# Patient Record
Sex: Female | Born: 1950 | Race: White | Hispanic: No | Marital: Married | Smoking: Former smoker
Health system: Southern US, Community
[De-identification: ages and names within clinical notes are randomized; demographics above are authoritative.]

## PROBLEM LIST (undated history)

## (undated) DIAGNOSIS — C719 Malignant neoplasm of brain, unspecified: Secondary | ICD-10-CM

## (undated) DIAGNOSIS — E559 Vitamin D deficiency, unspecified: Secondary | ICD-10-CM

## (undated) DIAGNOSIS — I10 Essential (primary) hypertension: Secondary | ICD-10-CM

## (undated) DIAGNOSIS — C3490 Malignant neoplasm of unspecified part of unspecified bronchus or lung: Secondary | ICD-10-CM

## (undated) DIAGNOSIS — R7303 Prediabetes: Secondary | ICD-10-CM

## (undated) DIAGNOSIS — F419 Anxiety disorder, unspecified: Secondary | ICD-10-CM

## (undated) DIAGNOSIS — E785 Hyperlipidemia, unspecified: Secondary | ICD-10-CM

## (undated) HISTORY — DX: Vitamin D deficiency, unspecified: E55.9

## (undated) HISTORY — DX: Malignant neoplasm of brain, unspecified: C71.9

## (undated) HISTORY — DX: Hyperlipidemia, unspecified: E78.5

## (undated) HISTORY — DX: Malignant neoplasm of unspecified part of unspecified bronchus or lung: C34.90

## (undated) HISTORY — DX: Essential (primary) hypertension: I10

## (undated) HISTORY — DX: Anxiety disorder, unspecified: F41.9

## (undated) HISTORY — DX: Prediabetes: R73.03

---

## 1997-06-06 HISTORY — PX: ABDOMINAL HYSTERECTOMY: SHX81

## 1998-06-06 HISTORY — PX: CARPAL TUNNEL RELEASE: SHX101

## 1998-06-29 ENCOUNTER — Other Ambulatory Visit: Admission: RE | Admit: 1998-06-29 | Discharge: 1998-06-29 | Payer: Self-pay | Admitting: Obstetrics and Gynecology

## 2000-01-11 ENCOUNTER — Ambulatory Visit (HOSPITAL_BASED_OUTPATIENT_CLINIC_OR_DEPARTMENT_OTHER): Admission: RE | Admit: 2000-01-11 | Discharge: 2000-01-11 | Payer: Self-pay | Admitting: Orthopedic Surgery

## 2000-02-22 ENCOUNTER — Ambulatory Visit (HOSPITAL_BASED_OUTPATIENT_CLINIC_OR_DEPARTMENT_OTHER): Admission: RE | Admit: 2000-02-22 | Discharge: 2000-02-22 | Payer: Self-pay | Admitting: Orthopedic Surgery

## 2000-06-06 HISTORY — PX: LASIK: SHX215

## 2002-09-30 ENCOUNTER — Other Ambulatory Visit: Admission: RE | Admit: 2002-09-30 | Discharge: 2002-09-30 | Payer: Self-pay | Admitting: Obstetrics and Gynecology

## 2005-06-23 ENCOUNTER — Other Ambulatory Visit: Admission: RE | Admit: 2005-06-23 | Discharge: 2005-06-23 | Payer: Self-pay | Admitting: Internal Medicine

## 2010-02-01 ENCOUNTER — Ambulatory Visit (HOSPITAL_COMMUNITY): Admission: RE | Admit: 2010-02-01 | Discharge: 2010-02-01 | Payer: Self-pay | Admitting: Internal Medicine

## 2010-10-22 NOTE — Op Note (Signed)
[place]. [facility]  Patient:    Christina Hale, Christina Hale                      MRN: [id number] Proc. Date: 01/11/00 Adm. Date:  [id number] Disc. 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  OPERATION PERFORMED:  Release of left dorsal compartment.  SURGEON:  Katy Fitch. Sypher, Montez Hageman., M.D.  ASSISTANT:  Annye Rusk, P.A.-C.  ANESTHESIA:  Marcaine 0.25% and 2% lidocaine field block supplemented by IV sedation.  ANESTHESIOLOGIST:  Judie Petit, M.D.  INDICATIONS:  Christina Hale is a 60 year old woman who has had chronic wrist pain on the radial aspect of her right and left wrists.  She has been treated with splints and anti-inflammatory medication with relief of her symptoms on the right.  She has had a very firm swelling develop on the left first dorsal compartment consistent with a ganglion or giant cell tumor and has had chronic stenosis and tenosynovitis unresponsive to nonoperative measures.  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  DESCRIPTION OF PROCEDURE:  Christina Hale is brought to the operating room and placed in the supine position on the operating table.  Following placement of a 1/4% Marcaine and 2% lidocaine field block including the first dorsal compartment, the left arm was prepped with Duraprep and draped with stockinette and sterile impervious drapes.  The arm was exsanguinated with an Esmarch bandage and a sterile tourniquet inflated to 220 mmHg.  The procedure commenced with a short transverse incision directly over the first dorsal compartment.  Subcutaneous  tissues were carefully divided revealing the radial superficial sensory branches. These were gently retracted.  The first dorsal compartment was noted to be swollen with two giant cell type lesions forming within the substance of the compartment.  The compartment wall had thickened to more than 4 mm in diameter.  This was incised with a scalpel and the two fibers of the compartment identified.  The abductor pollicis longus tendon slips were noted on the palmar aspect of the compartment and a septum was identified and resected. The extensor pollicis brevis was otherwise normal.  Thereafter full range of motion of the thumb was recovered without pain.  The wound was repaired with interdermal 3-0 Prolene suture and Steri-Strips. A compressive dressing applied with sterile gauze and Ace wrap.  There were no apparent complications.  Christina Hale is advised to begin immediate active range of motion exercises. She will return to the office and will follow up in approximately 10 days for suture removal and initiation of a strengthening program. DD:  01/11/00 TD:  01/12/00 Job: [id number]

## 2010-10-22 NOTE — Op Note (Signed)
. [name]  Patient:    Christina Hale, Christina Hale                      MRN: [id number] Proc. Date: 02/22/00 Adm. 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  POSTOPERATIVE DIAGNOSIS:  Chronic stenosing tenosynovitis right first dorsal compartment with severe thickening of extensor retinaculum of first dorsal compartment.  OPERATION:  Release of first dorsal compartment with resection of septum between extensor pollicis and adductor pollicis longus tendons.  SURGEON:  Katy Fitch. Sypher, Montez Hageman., M.D.  ASSISTANT:  Jonni Sanger, P.A.  ANESTHESIA:  Marcaine 0.25% and 2% Lidocaine without epinephrine field block and first dorsal compartment block, right wrist supplemented by the sedation.  SUPERVISING ANESTHESIOLOGIST:  Halford Decamp, M.D.  INDICATIONS:  [name] is a 60 year old woman who has had severe bilateral dorsal compartment stenosing tenosynovitis.  She is status post repair of her left first dorsal compartment predicament and is now scheduled for right release.  After informed consent, she was brought to the operating room at this time. Preoperatively she was noted to have a positive Finkelsteins sign, significant thickening of the compartment and has failed conservative management.  DESCRIPTION OF PROCEDURE:  [name] was brought to the operating room and placed in the supine position on the operating table.  Follow alcohol prep 0.25% Marcaine and 2% Lidocaine were infiltrated in to first dorsal compartment and skin over the radial styloid region.  When anesthesia was satisfactory, the arm was prepped with Betadine soap and solution and sterilely draped.  The arm was exsanguinated with Esmarch bandage and the  arterial tourniquet inflated to 220 mmHg.  Procedure commenced with a short transverse incision directly over the top of the enlarged compartment. Subcutaneous tissues were carefully divided revealing the radial sensory branches.  These were carefully retracted.  The first dorsal compartment was markedly thickened and calcified.  This was split with scalpel and had a wall thickness measuring more than 5 mm.  The extensor pollicis brevis was located in the dorsal compartment. A second incision was fashioned in more palmar position revealing the other pollicis longus tendon slips. The septum between the two compartments was resected with scissors and rongeur dissection.  The tendons were noted to be in basically good repair, although the extensor pollicis brevis appeared to be a bit frayed.  The wound was inspected for bleeding points.  A minor synovectomy was performed on the extensor pollicis brevis followed by repair of the skin with intradermal 3-0 Prolene and Steri-Strips.  Compressive dressing was applied with Xeroflow sterile gauze and ACE wrap. There were no apparent complications.  [name] tolerated the surgery and anesthesia well.  She was transferred to the recovery room in stable condition. DD:  02/22/00 TD:  02/24/00 Job: [id number]

## 2012-06-14 ENCOUNTER — Encounter: Payer: Self-pay | Admitting: Internal Medicine

## 2013-01-25 ENCOUNTER — Other Ambulatory Visit: Payer: Self-pay | Admitting: Internal Medicine

## 2013-01-30 ENCOUNTER — Encounter: Payer: Self-pay | Admitting: Internal Medicine

## 2013-01-31 ENCOUNTER — Ambulatory Visit
Admission: RE | Admit: 2013-01-31 | Discharge: 2013-01-31 | Disposition: A | Discharge status: Home / Self Care | Payer: Self-pay | Source: Ambulatory Visit | Attending: Internal Medicine | Admitting: Internal Medicine

## 2013-05-23 ENCOUNTER — Other Ambulatory Visit: Payer: Self-pay | Admitting: Internal Medicine

## 2013-07-16 ENCOUNTER — Other Ambulatory Visit: Payer: Self-pay | Admitting: Internal Medicine

## 2013-07-25 ENCOUNTER — Other Ambulatory Visit: Payer: Self-pay | Admitting: Internal Medicine

## 2013-08-23 ENCOUNTER — Other Ambulatory Visit: Payer: Self-pay | Admitting: Internal Medicine

## 2013-08-23 DIAGNOSIS — R7303 Prediabetes: Secondary | ICD-10-CM | POA: Insufficient documentation

## 2013-08-23 DIAGNOSIS — E559 Vitamin D deficiency, unspecified: Secondary | ICD-10-CM | POA: Insufficient documentation

## 2013-08-23 DIAGNOSIS — I10 Essential (primary) hypertension: Secondary | ICD-10-CM | POA: Insufficient documentation

## 2013-08-23 DIAGNOSIS — E785 Hyperlipidemia, unspecified: Secondary | ICD-10-CM | POA: Insufficient documentation

## 2013-08-23 DIAGNOSIS — F419 Anxiety disorder, unspecified: Secondary | ICD-10-CM | POA: Insufficient documentation

## 2013-08-26 ENCOUNTER — Ambulatory Visit (INDEPENDENT_AMBULATORY_CARE_PROVIDER_SITE_OTHER): Payer: BC Managed Care – PPO | Admitting: Physician Assistant

## 2013-08-26 ENCOUNTER — Encounter: Payer: Self-pay | Admitting: Physician Assistant

## 2013-08-26 VITALS — BP 136/70 | HR 100 | Temp 98.2°F | Resp 18 | Ht 70.0 in | Wt 186.0 lb

## 2013-08-26 DIAGNOSIS — E559 Vitamin D deficiency, unspecified: Secondary | ICD-10-CM

## 2013-08-26 DIAGNOSIS — F419 Anxiety disorder, unspecified: Secondary | ICD-10-CM

## 2013-08-26 DIAGNOSIS — E785 Hyperlipidemia, unspecified: Secondary | ICD-10-CM

## 2013-08-26 DIAGNOSIS — R7309 Other abnormal glucose: Secondary | ICD-10-CM

## 2013-08-26 DIAGNOSIS — I10 Essential (primary) hypertension: Secondary | ICD-10-CM

## 2013-08-26 DIAGNOSIS — Z79899 Other long term (current) drug therapy: Secondary | ICD-10-CM

## 2013-08-26 DIAGNOSIS — R7303 Prediabetes: Secondary | ICD-10-CM

## 2013-08-26 DIAGNOSIS — F411 Generalized anxiety disorder: Secondary | ICD-10-CM

## 2013-08-26 LAB — CBC WITH DIFFERENTIAL/PLATELET
BASOS ABS: 0.1 10*3/uL (ref 0.0–0.1)
BASOS PCT: 1 % (ref 0–1)
EOS ABS: 0.2 10*3/uL (ref 0.0–0.7)
Eosinophils Relative: 2 % (ref 0–5)
HCT: 44.3 % (ref 36.0–46.0)
HEMOGLOBIN: 15.2 g/dL — AB (ref 12.0–15.0)
LYMPHS ABS: 1.4 10*3/uL (ref 0.7–4.0)
Lymphocytes Relative: 17 % (ref 12–46)
MCH: 30.9 pg (ref 26.0–34.0)
MCHC: 34.3 g/dL (ref 30.0–36.0)
MCV: 90 fL (ref 78.0–100.0)
MONO ABS: 0.5 10*3/uL (ref 0.1–1.0)
MONOS PCT: 6 % (ref 3–12)
NEUTROS ABS: 6.2 10*3/uL (ref 1.7–7.7)
NEUTROS PCT: 74 % (ref 43–77)
PLATELETS: 263 10*3/uL (ref 150–400)
RBC: 4.92 MIL/uL (ref 3.87–5.11)
RDW: 13.3 % (ref 11.5–15.5)
WBC: 8.4 10*3/uL (ref 4.0–10.5)

## 2013-08-26 LAB — HEMOGLOBIN A1C
Hgb A1c MFr Bld: 5.9 % — ABNORMAL HIGH (ref <5.7)
Mean Plasma Glucose: 123 mg/dL — ABNORMAL HIGH (ref <117)

## 2013-08-26 MED ORDER — ALPRAZOLAM 1 MG PO TABS: ORAL_TABLET | ORAL | Status: DC

## 2013-08-26 NOTE — Progress Notes (Signed)
HPI 63 y.o. female  presents for 3 month follow up with hypertension, hyperlipidemia, prediabetes and vitamin D. Her blood pressure has been controlled at home, today their BP is BP: 136/70 mmHg She does workout. She denies chest pain, shortness of breath, dizziness.  She is on cholesterol medication and denies myalgias. Her cholesterol is at goal. The cholesterol last visit was:   She has been working on diet and exercise for prediabetes, and denies paresthesia of the feet, polydipsia, polyuria and visual disturbances. Last A1C in the office was:  Husband diagnosed with cancer and starting chemo in Feb, very stressfull, 3 days a week, occ takes 1/2 xanax during the day.  Patient is on Vitamin D supplement.    Current Medications:  Current Outpatient Prescriptions on File Prior to Visit  Medication Sig Dispense Refill  . ALPRAZolam (XANAX) 1 MG tablet TAKE 1/2 TO 1 TABLET BY MOUTH DAILY AS NEEDED FOR ANXIETY  90 tablet  0  . atorvastatin (LIPITOR) 20 MG tablet TAKE 1 TABLET EVERY DAY  30 tablet  2  . BABY ASPIRIN PO Take 81 mg by mouth daily.      . Cholecalciferol (VITAMIN D PO) Take 2,000 Int'l Units by mouth 2 (two) times daily.       . hydrochlorothiazide (HYDRODIURIL) 25 MG tablet Take 25 mg by mouth daily.      Marland Kitchen MAGNESIUM PO Take 250 mg by mouth every other day.      . Multiple Vitamins-Minerals (MULTIVITAMIN PO) Take by mouth daily.      . Ranitidine HCl (ZANTAC PO) Take by mouth daily as needed.        No current facility-administered medications on file prior to visit.   Medical History:  Past Medical History  Diagnosis Date  . Hyperlipidemia   . Hypertension   . Prediabetes   . Vitamin D deficiency   . Anxiety    Allergies:  Allergies  Allergen Reactions  . Codeine Nausea Only  . Iodine Nausea Only  . Prednisolone Nausea Only     Review of Systems: [X]  = complains of  [ ]  = denies  General: Fatigue [ ]  Fever [ ]  Chills [ ]  Weakness [ ]   Insomnia [ ]  Eyes: Redness  [ ]  Blurred vision [ ]  Diplopia [ ]   ENT: Congestion [ ]  Sinus Pain [ ]  Post Nasal Drip [ ]  Sore Throat [ ]  Earache [ ]   Cardiac: Chest pain/pressure [ ]  SOB [ ]  Orthopnea [ ]   Palpitations [ ]   Paroxysmal nocturnal dyspnea[ ]  Claudication [ ]  Edema [ ]   Pulmonary: Cough [ ]  Wheezing[ ]   SOB [ ]   Snoring [ ]   GI: Nausea [ ]  Vomiting[ ]  Dysphagia[ ]  Heartburn[ ]  Abdominal pain [ ]  Constipation Valu.Nieves ]; Diarrhea Valu.Nieves ]; BRBPR [ ]  Melena[ ]  GU: Hematuria[ ]  Dysuria [ ]  Nocturia[ ]  Urgency [ ]   Hesitancy [ ]  Discharge [ ]  Neuro: Headaches[ ]  Vertigo[ ]  Paresthesias[ ]  Spasm [ ]  Speech changes [ ]  Incoordination [ ]   Ortho: Arthritis [ ]  Joint pain [ ]  Muscle pain [ ]  Joint swelling [ ]  Back Pain [ ]  Skin:  Rash [ ]   Pruritis [ ]  Change in skin lesion [ ]   Psych: Depression[ ]  Anxiety[ ]  Confusion [ ]  Memory loss [ ]   Heme/Lypmh: Bleeding [ ]  Bruising [ ]  Enlarged lymph nodes [ ]   Endocrine: Visual blurring [ ]  Paresthesia [ ]  Polyuria [ ]  Polydypsea [ ]    Heat/cold intolerance [ ]   Hypoglycemia [ ]   Family history- Review and unchanged Social history- Review and unchanged Physical Exam: BP 136/70  Pulse 100  Temp(Src) 98.2 F (36.8 C) (Temporal)  Resp 18  Ht 5\' 10"  (1.778 m)  Wt 186 lb (84.369 kg)  BMI 26.69 kg/m2 Wt Readings from Last 3 Encounters:  08/26/13 186 lb (84.369 kg)   General Appearance: Well nourished, in no apparent distress. Eyes: PERRLA, EOMs, conjunctiva no swelling or erythema Sinuses: No Frontal/maxillary tenderness ENT/Mouth: Ext aud canals clear, TMs without erythema, bulging. No erythema, swelling, or exudate on post pharynx.  Tonsils not swollen or erythematous. Hearing normal.  Neck: Supple, thyroid normal.  Respiratory: Respiratory effort normal, BS equal bilaterally without rales, rhonchi, wheezing or stridor.  Cardio: RRR with no MRGs. Brisk peripheral pulses without edema.  Abdomen: Soft, + BS.  Non tender, no guarding, rebound, hernias, masses. Lymphatics: Non  tender without lymphadenopathy.  Musculoskeletal: Full ROM, 5/5 strength, normal gait.  Skin: Warm, dry without rashes, lesions, ecchymosis.  Neuro: Cranial nerves intact. Normal muscle tone, no cerebellar symptoms. Sensation intact.  Psych: Awake and oriented X 3, normal affect, Insight and Judgment appropriate.   Assessment and Plan:  Hypertension: Continue medication, monitor blood pressure at home.  Continue DASH diet. Cholesterol: Continue diet and exercise. Check cholesterol.  Pre-diabetes-Continue diet and exercise. Check A1C Vitamin D Def- check level and continue medications.   Continue diet and meds as discussed. Further disposition pending results of labs.  Vicie Mutters 11:31 AM

## 2013-08-26 NOTE — Patient Instructions (Signed)
1-2 TABLE SPOONS OF BENEFIBER IN THE MORNING IN COFFEE, OATMEAL, WATER, ETC Helps stool, weight, and cholesterol, cheapest at [employer], [employer], sams    Bad carbs also include fruit juice, alcohol, and sweet tea. These are empty calories that do not signal to your brain that you are full.   Please remember the good carbs are still carbs which convert into sugar. So please measure them out no more than 1/2-1 cup of rice, oatmeal, pasta, and beans.  Veggies are however free foods! Pile them on.   I like lean protein at every meal such as chicken, [place], pork chops, cottage cheese, etc. Just do not fry these meats and please center your meal around vegetable, the meats should be a side dish.   No all fruit is created equal. Please see the list below, the fruit at the bottom is higher in sugars than the fruit at the top   Cholesterol Cholesterol is a white, waxy, fat-like protein needed by your body in small amounts. The liver makes all the cholesterol you need. It is carried from the liver by the blood through the blood vessels. Deposits (plaque) may build up on blood vessel walls. This makes the arteries narrower and stiffer. Plaque increases the risk for heart attack and stroke. You cannot feel your cholesterol level even if it is very high. The only way to know is by a blood test to check your lipid (fats) levels. Once you know your cholesterol levels, you should keep a record of the test results. Work with your caregiver to to keep your levels in the desired range. WHAT THE RESULTS MEAN:  Total cholesterol is a rough measure of all the cholesterol in your blood.  LDL is the so-called bad cholesterol. This is the type that deposits cholesterol in the walls of the arteries. You want this level to be low.  HDL is the good cholesterol because it cleans the arteries and carries the LDL away. You want this level to be high.  Triglycerides are fat that the body can either burn for energy or store.  High levels are closely linked to heart disease. DESIRED LEVELS:  Total cholesterol below 200.  LDL below 100 for people at risk, below 70 for very high risk.  HDL above 50 is good, above 60 is best.  Triglycerides below 150. HOW TO LOWER YOUR CHOLESTEROL:  Diet.  Choose fish or white meat chicken and [place], roasted or baked. Limit fatty cuts of red meat, fried foods, and processed meats, such as sausage and lunch meat.  Eat lots of fresh fruits and vegetables. Choose whole grains, beans, pasta, potatoes and cereals.  Use only small amounts of olive, corn or canola oils. Avoid butter, mayonnaise, shortening or palm kernel oils. Avoid foods with trans-fats.  Use skim/nonfat milk and low-fat/nonfat yogurt and cheeses. Avoid whole milk, cream, ice cream, egg yolks and cheeses. Healthy desserts include angel food cake, ginger snaps, animal crackers, hard candy, popsicles, and low-fat/nonfat frozen yogurt. Avoid pastries, cakes, pies and cookies.  Exercise.  A regular program helps decrease LDL and raises HDL.  Helps with weight control.  Do things that increase your activity level like gardening, walking, or taking the stairs.  Medication.  May be prescribed by your caregiver to help lowering cholesterol and the risk for heart disease.  You may need medicine even if your levels are normal if you have several risk factors. HOME CARE INSTRUCTIONS   Follow your diet and exercise programs as suggested by  your caregiver.  Take medications as directed.  Have blood work done when your caregiver feels it is necessary. MAKE SURE YOU:   Understand these instructions.  Will watch your condition.  Will get help right away if you are not doing well or get worse. Document Released: 02/15/2001 Document Revised: 08/15/2011 Document Reviewed: 03/06/2013 [facility] Patient Information 2014 [place], [place].

## 2013-08-27 LAB — LIPID PANEL
CHOL/HDL RATIO: 2.5 ratio
Cholesterol: 163 mg/dL (ref 0–200)
HDL: 66 mg/dL (ref >39)
LDL CALC: 84 mg/dL (ref 0–99)
Triglycerides: 66 mg/dL (ref <150)
VLDL: 13 mg/dL (ref 0–40)

## 2013-08-27 LAB — HEPATIC FUNCTION PANEL
ALBUMIN: 4.1 g/dL (ref 3.5–5.2)
ALT: 15 U/L (ref 0–35)
AST: 17 U/L (ref 0–37)
Alkaline Phosphatase: 116 U/L (ref 39–117)
BILIRUBIN INDIRECT: 0.4 mg/dL (ref 0.2–1.2)
BILIRUBIN TOTAL: 0.5 mg/dL (ref 0.2–1.2)
Bilirubin, Direct: 0.1 mg/dL (ref 0.0–0.3)
TOTAL PROTEIN: 7.1 g/dL (ref 6.0–8.3)

## 2013-08-27 LAB — VITAMIN D 25 HYDROXY (VIT D DEFICIENCY, FRACTURES): Vit D, 25-Hydroxy: 75 ng/mL (ref 30–89)

## 2013-08-27 LAB — BASIC METABOLIC PANEL WITH GFR
BUN: 7 mg/dL (ref 6–23)
CO2: 28 meq/L (ref 19–32)
Calcium: 9.7 mg/dL (ref 8.4–10.5)
Chloride: 96 mEq/L (ref 96–112)
Creat: 0.54 mg/dL (ref 0.50–1.10)
GFR, Est African American: >89 mL/min
Glucose, Bld: 102 mg/dL — ABNORMAL HIGH (ref 70–99)
POTASSIUM: 3.9 meq/L (ref 3.5–5.3)
SODIUM: 135 meq/L (ref 135–145)

## 2013-08-27 LAB — TSH: TSH: 1.796 u[IU]/mL (ref 0.350–4.500)

## 2013-08-27 LAB — INSULIN, FASTING: INSULIN FASTING, SERUM: 14 u[IU]/mL (ref 3–28)

## 2013-08-27 LAB — MAGNESIUM: Magnesium: 1.8 mg/dL (ref 1.5–2.5)

## 2013-08-28 ENCOUNTER — Ambulatory Visit (INDEPENDENT_AMBULATORY_CARE_PROVIDER_SITE_OTHER): Payer: BC Managed Care – PPO | Admitting: Physician Assistant

## 2013-08-28 VITALS — BP 150/78 | HR 96 | Temp 98.2°F | Resp 16 | Wt 189.0 lb

## 2013-08-28 DIAGNOSIS — K219 Gastro-esophageal reflux disease without esophagitis: Secondary | ICD-10-CM

## 2013-08-28 DIAGNOSIS — R1013 Epigastric pain: Secondary | ICD-10-CM

## 2013-08-28 DIAGNOSIS — R110 Nausea: Secondary | ICD-10-CM

## 2013-08-28 LAB — CBC WITH DIFFERENTIAL/PLATELET
BASOS ABS: 0 10*3/uL (ref 0.0–0.1)
BASOS PCT: 0 % (ref 0–1)
EOS PCT: 0 % (ref 0–5)
Eosinophils Absolute: 0 10*3/uL (ref 0.0–0.7)
HCT: 40.9 % (ref 36.0–46.0)
HEMOGLOBIN: 14.2 g/dL (ref 12.0–15.0)
Lymphocytes Relative: 12 % (ref 12–46)
Lymphs Abs: 1.3 10*3/uL (ref 0.7–4.0)
MCH: 30.4 pg (ref 26.0–34.0)
MCHC: 34.7 g/dL (ref 30.0–36.0)
MCV: 87.6 fL (ref 78.0–100.0)
MONO ABS: 0.5 10*3/uL (ref 0.1–1.0)
MONOS PCT: 5 % (ref 3–12)
NEUTROS ABS: 9 10*3/uL — AB (ref 1.7–7.7)
NEUTROS PCT: 83 % — AB (ref 43–77)
PLATELETS: 266 10*3/uL (ref 150–400)
RBC: 4.67 MIL/uL (ref 3.87–5.11)
RDW: 13.3 % (ref 11.5–15.5)
WBC: 10.8 10*3/uL — AB (ref 4.0–10.5)

## 2013-08-28 MED ORDER — PROMETHAZINE HCL 25 MG/ML IJ SOLN
25.0000 mg | Freq: Once | INTRAMUSCULAR | Status: AC
  Administered 2013-08-28: 25 mg via INTRAMUSCULAR

## 2013-08-28 MED ORDER — PROMETHAZINE HCL 12.5 MG PO TABS: 12.5000 mg | ORAL_TABLET | Freq: Four times a day (QID) | ORAL | Status: DC | PRN

## 2013-08-28 MED ORDER — HYOSCYAMINE SULFATE 0.125 MG SL SUBL: 0.1250 mg | SUBLINGUAL_TABLET | SUBLINGUAL | Status: DC | PRN

## 2013-08-28 NOTE — Progress Notes (Signed)
   Subjective:    Patient ID: Christina Hale, female    DOB: [birth year], 63 y.o.   MRN: [id number]  Abdominal Pain This is a new problem. The current episode started today. The onset quality is sudden. The problem occurs constantly. The pain is located in the epigastric region. Associated symptoms include anorexia, belching, diarrhea and nausea. Pertinent negatives include no constipation, fever, headaches or vomiting. The pain is relieved by nothing. She has tried antacids for the symptoms. The treatment provided no relief.    Review of Systems  Constitutional: Positive for chills and appetite change. Negative for fever, diaphoresis and fatigue.  HENT: Negative.   Respiratory: Negative.   Cardiovascular: Negative.   Gastrointestinal: Positive for nausea, abdominal pain, diarrhea and anorexia. Negative for vomiting, constipation, blood in stool, abdominal distention, anal bleeding and rectal pain.  Genitourinary: Negative.   Musculoskeletal: Negative.   Neurological: Positive for dizziness. Negative for tremors, seizures, syncope, facial asymmetry, speech difficulty, weakness, light-headedness, numbness and headaches.       Objective:   Physical Exam  Constitutional: She is oriented to person, place, and time. She appears well-developed and well-nourished.  HENT:  Head: Normocephalic and atraumatic.  Right Ear: External ear normal.  Left Ear: External ear normal.  Mouth/Throat: Oropharynx is clear and moist.  Eyes: Conjunctivae and EOM are normal. Pupils are equal, round, and reactive to light.  Neck: Normal range of motion. Neck supple. No thyromegaly present.  Cardiovascular: Normal rate, regular rhythm and normal heart sounds.  Exam reveals no gallop and no friction rub.   No murmur heard. Pulmonary/Chest: Effort normal and breath sounds normal. No respiratory distress. She has no wheezes.  Abdominal: Soft. Bowel sounds are normal. She exhibits no shifting dullness, no distension,  no abdominal bruit, no pulsatile midline mass and no mass. There is no hepatosplenomegaly. There is tenderness in the epigastric area. There is no rigidity, no rebound, no guarding, no CVA tenderness, no tenderness at McBurney's point and negative Murphy's sign. No hernia.  Musculoskeletal: Normal range of motion.  Lymphadenopathy:    She has no cervical adenopathy.  Neurological: She is alert and oriented to person, place, and time.  Skin: Skin is warm and dry.  Psychiatric: She has a normal mood and affect.      Assessment & Plan:  Abdominal pain, epigastric -  Epigastric pain:? infection, ulcer ,GB, pancreatitis-  check labs, bland diet, small portions, increase H20, PPI Amylase, CBC with Differential, BASIC METABOLIC PANEL WITH GFR, Hepatic function panel if pain continues will return for Ct AB  Patient advised to go to the ER if the symptoms increase or worsen.    GERD (gastroesophageal reflux disease) - Plan: Helicobacter pylori abs-IgG+IgA, bld  Nausea alone - Plan: promethazine (PHENERGAN) injection 25 mg

## 2013-08-28 NOTE — Patient Instructions (Signed)
Abdominal Pain, Women °Abdominal (stomach, pelvic, or belly) pain can be caused by many things. It is important to tell your doctor: °· The location of the pain. °· Does it come and go or is it present all the time? °· Are there things that start the pain (eating certain foods, exercise)? °· Are there other symptoms associated with the pain (fever, nausea, vomiting, diarrhea)? °All of this is helpful to know when trying to find the cause of the pain. °CAUSES  °· Stomach: virus or bacteria infection, or ulcer. °· Intestine: appendicitis (inflamed appendix), regional ileitis (Crohn's disease), ulcerative colitis (inflamed colon), irritable bowel syndrome, diverticulitis (inflamed diverticulum of the colon), or cancer of the stomach or intestine. °· Gallbladder disease or stones in the gallbladder. °· Kidney disease, kidney stones, or infection. °· Pancreas infection or cancer. °· Fibromyalgia (pain disorder). °· Diseases of the female organs: °· Uterus: fibroid (non-cancerous) tumors or infection. °· Fallopian tubes: infection or tubal pregnancy. °· Ovary: cysts or tumors. °· Pelvic adhesions (scar tissue). °· Endometriosis (uterus lining tissue growing in the pelvis and on the pelvic organs). °· Pelvic congestion syndrome (female organs filling up with blood just before the menstrual period). °· Pain with the menstrual period. °· Pain with ovulation (producing an egg). °· Pain with an IUD (intrauterine device, birth control) in the uterus. °· Cancer of the female organs. °· Functional pain (pain not caused by a disease, may improve without treatment). °· Psychological pain. °· Depression. °DIAGNOSIS  °Your doctor will decide the seriousness of your pain by doing an examination. °· Blood tests. °· X-rays. °· Ultrasound. °· CT scan (computed tomography, special type of X-ray). °· MRI (magnetic resonance imaging). °· Cultures, for infection. °· Barium enema (dye inserted in the large intestine, to better view it with  X-rays). °· Colonoscopy (looking in intestine with a lighted tube). °· Laparoscopy (minor surgery, looking in abdomen with a lighted tube). °· Major abdominal exploratory surgery (looking in abdomen with a large incision). °TREATMENT  °The treatment will depend on the cause of the pain.  °· Many cases can be observed and treated at home. °· Over-the-counter medicines recommended by your caregiver. °· Prescription medicine. °· Antibiotics, for infection. °· Birth control pills, for painful periods or for ovulation pain. °· Hormone treatment, for endometriosis. °· Nerve blocking injections. °· Physical therapy. °· Antidepressants. °· Counseling with a psychologist or psychiatrist. °· Minor or major surgery. °HOME CARE INSTRUCTIONS  °· Do not take laxatives, unless directed by your caregiver. °· Take over-the-counter pain medicine only if ordered by your caregiver. Do not take aspirin because it can cause an upset stomach or bleeding. °· Try a clear liquid diet (broth or water) as ordered by your caregiver. Slowly move to a bland diet, as tolerated, if the pain is related to the stomach or intestine. °· Have a thermometer and take your temperature several times a day, and record it. °· Bed rest and sleep, if it helps the pain. °· Avoid sexual intercourse, if it causes pain. °· Avoid stressful situations. °· Keep your follow-up appointments and tests, as your caregiver orders. °· If the pain does not go away with medicine or surgery, you may try: °· Acupuncture. °· Relaxation exercises (yoga, meditation). °· Group therapy. °· Counseling. °SEEK MEDICAL CARE IF:  °· You notice certain foods cause stomach pain. °· Your home care treatment is not helping your pain. °· You need stronger pain medicine. °· You want your IUD removed. °· You feel faint or   lightheaded. °· You develop nausea and vomiting. °· You develop a rash. °· You are having side effects or an allergy to your medicine. °SEEK IMMEDIATE MEDICAL CARE IF:  °· Your  pain does not go away or gets worse. °· You have a fever. °· Your pain is felt only in portions of the abdomen. The right side could possibly be appendicitis. The left lower portion of the abdomen could be colitis or diverticulitis. °· You are passing blood in your stools (bright red or black tarry stools, with or without vomiting). °· You have blood in your urine. °· You develop chills, with or without a fever. °· You pass out. °MAKE SURE YOU:  °· Understand these instructions. °· Will watch your condition. °· Will get help right away if you are not doing well or get worse. °Document Released: 03/20/2007 Document Revised: 08/15/2011 Document Reviewed: 04/09/2009 °[employer]® Patient Information ©2014 [employer]. ° °

## 2013-08-29 ENCOUNTER — Emergency Department (HOSPITAL_COMMUNITY): Payer: BC Managed Care – PPO

## 2013-08-29 ENCOUNTER — Inpatient Hospital Stay (HOSPITAL_COMMUNITY)
Admission: EM | Admit: 2013-08-29 | Discharge: 2013-09-01 | DRG: 100 | Disposition: A | Discharge status: Home / Self Care | Payer: BC Managed Care – PPO | Attending: Internal Medicine | Admitting: Internal Medicine

## 2013-08-29 ENCOUNTER — Encounter (HOSPITAL_COMMUNITY): Payer: Self-pay | Admitting: Emergency Medicine

## 2013-08-29 DIAGNOSIS — F411 Generalized anxiety disorder: Secondary | ICD-10-CM | POA: Diagnosis present

## 2013-08-29 DIAGNOSIS — F419 Anxiety disorder, unspecified: Secondary | ICD-10-CM

## 2013-08-29 DIAGNOSIS — E878 Other disorders of electrolyte and fluid balance, not elsewhere classified: Secondary | ICD-10-CM | POA: Diagnosis present

## 2013-08-29 DIAGNOSIS — I10 Essential (primary) hypertension: Secondary | ICD-10-CM

## 2013-08-29 DIAGNOSIS — E559 Vitamin D deficiency, unspecified: Secondary | ICD-10-CM | POA: Diagnosis present

## 2013-08-29 DIAGNOSIS — K7689 Other specified diseases of liver: Secondary | ICD-10-CM | POA: Diagnosis present

## 2013-08-29 DIAGNOSIS — F17200 Nicotine dependence, unspecified, uncomplicated: Secondary | ICD-10-CM | POA: Diagnosis present

## 2013-08-29 DIAGNOSIS — E785 Hyperlipidemia, unspecified: Secondary | ICD-10-CM | POA: Diagnosis present

## 2013-08-29 DIAGNOSIS — R7303 Prediabetes: Secondary | ICD-10-CM

## 2013-08-29 DIAGNOSIS — R160 Hepatomegaly, not elsewhere classified: Secondary | ICD-10-CM

## 2013-08-29 DIAGNOSIS — E871 Hypo-osmolality and hyponatremia: Secondary | ICD-10-CM | POA: Diagnosis present

## 2013-08-29 DIAGNOSIS — Z7982 Long term (current) use of aspirin: Secondary | ICD-10-CM

## 2013-08-29 DIAGNOSIS — Z79899 Other long term (current) drug therapy: Secondary | ICD-10-CM

## 2013-08-29 DIAGNOSIS — R222 Localized swelling, mass and lump, trunk: Secondary | ICD-10-CM | POA: Diagnosis present

## 2013-08-29 DIAGNOSIS — R1110 Vomiting, unspecified: Secondary | ICD-10-CM

## 2013-08-29 DIAGNOSIS — J4489 Other specified chronic obstructive pulmonary disease: Secondary | ICD-10-CM | POA: Diagnosis present

## 2013-08-29 DIAGNOSIS — G9341 Metabolic encephalopathy: Secondary | ICD-10-CM | POA: Diagnosis present

## 2013-08-29 DIAGNOSIS — R918 Other nonspecific abnormal finding of lung field: Secondary | ICD-10-CM

## 2013-08-29 DIAGNOSIS — R197 Diarrhea, unspecified: Secondary | ICD-10-CM

## 2013-08-29 DIAGNOSIS — R569 Unspecified convulsions: Principal | ICD-10-CM | POA: Diagnosis present

## 2013-08-29 DIAGNOSIS — J449 Chronic obstructive pulmonary disease, unspecified: Secondary | ICD-10-CM | POA: Diagnosis present

## 2013-08-29 DIAGNOSIS — A0472 Enterocolitis due to Clostridium difficile, not specified as recurrent: Secondary | ICD-10-CM | POA: Diagnosis present

## 2013-08-29 LAB — CBC WITH DIFFERENTIAL/PLATELET
Basophils Absolute: 0 10*3/uL (ref 0.0–0.1)
Basophils Relative: 0 % (ref 0–1)
EOS ABS: 0.2 10*3/uL (ref 0.0–0.7)
EOS PCT: 2 % (ref 0–5)
HEMATOCRIT: 38.7 % (ref 36.0–46.0)
HEMOGLOBIN: 14.3 g/dL (ref 12.0–15.0)
LYMPHS PCT: 15 % (ref 12–46)
Lymphs Abs: 1.6 10*3/uL (ref 0.7–4.0)
MCH: 31.7 pg (ref 26.0–34.0)
MCHC: 37 g/dL — AB (ref 30.0–36.0)
MCV: 85.8 fL (ref 78.0–100.0)
MONOS PCT: 8 % (ref 3–12)
Monocytes Absolute: 0.9 10*3/uL (ref 0.1–1.0)
Neutro Abs: 8.4 10*3/uL — ABNORMAL HIGH (ref 1.7–7.7)
Neutrophils Relative %: 75 % (ref 43–77)
PLATELETS: 223 10*3/uL (ref 150–400)
RBC: 4.51 MIL/uL (ref 3.87–5.11)
RDW: 12.5 % (ref 11.5–15.5)
WBC: 11.2 10*3/uL — AB (ref 4.0–10.5)

## 2013-08-29 LAB — BASIC METABOLIC PANEL WITH GFR
BUN: 7 mg/dL (ref 6–23)
CHLORIDE: 87 meq/L — AB (ref 96–112)
CO2: 27 meq/L (ref 19–32)
Calcium: 9.5 mg/dL (ref 8.4–10.5)
Creat: 0.45 mg/dL — ABNORMAL LOW (ref 0.50–1.10)
GFR, Est African American: >89 mL/min
GFR, Est Non African American: >89 mL/min
GLUCOSE: 120 mg/dL — AB (ref 70–99)
Potassium: 4 mEq/L (ref 3.5–5.3)
SODIUM: 122 meq/L — AB (ref 135–145)

## 2013-08-29 LAB — AMYLASE: AMYLASE: 23 U/L (ref 0–105)

## 2013-08-29 LAB — URINE MICROSCOPIC-ADD ON

## 2013-08-29 LAB — COMPREHENSIVE METABOLIC PANEL
ALK PHOS: 122 U/L — AB (ref 39–117)
ALT: 14 U/L (ref 0–35)
AST: 26 U/L (ref 0–37)
Albumin: 3.8 g/dL (ref 3.5–5.2)
BUN: 5 mg/dL — AB (ref 6–23)
CALCIUM: 9.2 mg/dL (ref 8.4–10.5)
CHLORIDE: 75 meq/L — AB (ref 96–112)
CO2: 22 meq/L (ref 19–32)
Creatinine, Ser: 0.49 mg/dL — ABNORMAL LOW (ref 0.50–1.10)
GFR calc Af Amer: >90 mL/min (ref >90)
GFR calc non Af Amer: >90 mL/min (ref >90)
Glucose, Bld: 131 mg/dL — ABNORMAL HIGH (ref 70–99)
POTASSIUM: 3.4 meq/L — AB (ref 3.7–5.3)
SODIUM: 113 meq/L — AB (ref 137–147)
TOTAL PROTEIN: 7.7 g/dL (ref 6.0–8.3)
Total Bilirubin: 0.7 mg/dL (ref 0.3–1.2)

## 2013-08-29 LAB — URINALYSIS, ROUTINE W REFLEX MICROSCOPIC
Bilirubin Urine: NEGATIVE
GLUCOSE, UA: NEGATIVE mg/dL
Hgb urine dipstick: NEGATIVE
KETONES UR: NEGATIVE mg/dL
NITRITE: POSITIVE — AB
PH: 6.5 (ref 5.0–8.0)
PROTEIN: NEGATIVE mg/dL
Specific Gravity, Urine: 1.01 (ref 1.005–1.030)
UROBILINOGEN UA: 0.2 mg/dL (ref 0.0–1.0)

## 2013-08-29 LAB — HEPATIC FUNCTION PANEL
ALBUMIN: 4.1 g/dL (ref 3.5–5.2)
ALT: 15 U/L (ref 0–35)
AST: 23 U/L (ref 0–37)
Alkaline Phosphatase: 117 U/L (ref 39–117)
BILIRUBIN INDIRECT: 0.5 mg/dL (ref 0.2–1.2)
Bilirubin, Direct: 0.1 mg/dL (ref 0.0–0.3)
TOTAL PROTEIN: 7.3 g/dL (ref 6.0–8.3)
Total Bilirubin: 0.6 mg/dL (ref 0.2–1.2)

## 2013-08-29 MED ORDER — LORAZEPAM 2 MG/ML IJ SOLN
INTRAMUSCULAR | Status: AC
  Administered 2013-08-29: 1 mg via INTRAVENOUS
  Filled 2013-08-29: qty 1

## 2013-08-29 MED ORDER — ONDANSETRON HCL 4 MG/2ML IJ SOLN
4.0000 mg | Freq: Once | INTRAMUSCULAR | Status: AC
  Administered 2013-08-30: 4 mg via INTRAVENOUS
  Filled 2013-08-29: qty 2

## 2013-08-29 MED ORDER — SODIUM CHLORIDE 0.9 % IV BOLUS (SEPSIS)
1000.0000 mL | Freq: Once | INTRAVENOUS | Status: AC
  Administered 2013-08-29: 1000 mL via INTRAVENOUS

## 2013-08-29 MED ORDER — SODIUM CHLORIDE 3 % IV SOLN
INTRAVENOUS | Status: DC
  Filled 2013-08-29 (×3): qty 500

## 2013-08-29 MED ORDER — SODIUM CHLORIDE 3 % IV SOLN
INTRAVENOUS | Status: DC
  Administered 2013-08-29: via INTRAVENOUS
  Filled 2013-08-29 (×2): qty 500

## 2013-08-29 MED ORDER — LORAZEPAM 2 MG/ML IJ SOLN
1.0000 mg | Freq: Once | INTRAMUSCULAR | Status: AC
  Administered 2013-08-29: 1 mg via INTRAVENOUS

## 2013-08-29 MED ORDER — ONDANSETRON HCL 4 MG/2ML IJ SOLN
4.0000 mg | Freq: Once | INTRAMUSCULAR | Status: AC
  Administered 2013-08-29: 4 mg via INTRAVENOUS
  Filled 2013-08-29: qty 2

## 2013-08-29 NOTE — ED Notes (Signed)
Pt states she has been having nausea and diarrhea since yesterday morning  Pt states she had one episode of vomiting today  Pt was seen by her PCP yesterday afternoon and was given medication for nausea and nexium  Pt states it is not helping and she now feels dehydrated  Pt denies abd pain

## 2013-08-29 NOTE — ED Notes (Signed)
Called in to pt's room-pt is cyanotic with minimal resp effort/Dr. Dina Rich and Marcella at bedside bagging pt with 100% FiO2 -RT paged stat-sat increased to 100%-per Dr. Kara Dies had a seizure and respiratory arrested when post-ictal-family at bedside and support given.  Pt maintained pulse throughout and returned to spontaneous breathing within 1 minute-100%NRB placed with nasal trumpet to left nare-pt transferred to Res B for closer monitoring/seizure precautions initiated-monitor SR/VSS/2nd IV placed to administered hypertonic saline/family at bedside and updated on plan of care by Dr. Dina Rich.  Pt opening eyes, no verbal response at this time-remains post-ictal.

## 2013-08-30 ENCOUNTER — Inpatient Hospital Stay (HOSPITAL_COMMUNITY): Payer: BC Managed Care – PPO

## 2013-08-30 ENCOUNTER — Encounter (HOSPITAL_COMMUNITY): Payer: Self-pay | Admitting: Pulmonary Disease

## 2013-08-30 DIAGNOSIS — E871 Hypo-osmolality and hyponatremia: Secondary | ICD-10-CM

## 2013-08-30 DIAGNOSIS — I10 Essential (primary) hypertension: Secondary | ICD-10-CM

## 2013-08-30 DIAGNOSIS — E785 Hyperlipidemia, unspecified: Secondary | ICD-10-CM

## 2013-08-30 DIAGNOSIS — R569 Unspecified convulsions: Principal | ICD-10-CM

## 2013-08-30 DIAGNOSIS — F411 Generalized anxiety disorder: Secondary | ICD-10-CM

## 2013-08-30 LAB — BASIC METABOLIC PANEL
BUN: 3 mg/dL — AB (ref 6–23)
BUN: 4 mg/dL — AB (ref 6–23)
BUN: 4 mg/dL — AB (ref 6–23)
BUN: 4 mg/dL — ABNORMAL LOW (ref 6–23)
BUN: 4 mg/dL — ABNORMAL LOW (ref 6–23)
BUN: 5 mg/dL — ABNORMAL LOW (ref 6–23)
CALCIUM: 8.4 mg/dL (ref 8.4–10.5)
CHLORIDE: 82 meq/L — AB (ref 96–112)
CHLORIDE: 82 meq/L — AB (ref 96–112)
CHLORIDE: 87 meq/L — AB (ref 96–112)
CHLORIDE: 87 meq/L — AB (ref 96–112)
CO2: 22 mEq/L (ref 19–32)
CO2: 22 meq/L (ref 19–32)
CO2: 23 mEq/L (ref 19–32)
CO2: 23 mEq/L (ref 19–32)
CO2: 23 meq/L (ref 19–32)
CO2: 26 mEq/L (ref 19–32)
Calcium: 7.6 mg/dL — ABNORMAL LOW (ref 8.4–10.5)
Calcium: 8 mg/dL — ABNORMAL LOW (ref 8.4–10.5)
Calcium: 8 mg/dL — ABNORMAL LOW (ref 8.4–10.5)
Calcium: 8.2 mg/dL — ABNORMAL LOW (ref 8.4–10.5)
Calcium: 8.3 mg/dL — ABNORMAL LOW (ref 8.4–10.5)
Chloride: 85 mEq/L — ABNORMAL LOW (ref 96–112)
Chloride: 90 mEq/L — ABNORMAL LOW (ref 96–112)
Creatinine, Ser: 0.46 mg/dL — ABNORMAL LOW (ref 0.50–1.10)
Creatinine, Ser: 0.48 mg/dL — ABNORMAL LOW (ref 0.50–1.10)
Creatinine, Ser: 0.46 mg/dL — ABNORMAL LOW (ref 0.50–1.10)
Creatinine, Ser: 0.55 mg/dL (ref 0.50–1.10)
Creatinine, Ser: 0.5 mg/dL (ref 0.50–1.10)
Creatinine, Ser: 0.49 mg/dL — ABNORMAL LOW (ref 0.50–1.10)
GFR calc Af Amer: >90 mL/min (ref >90)
GFR calc Af Amer: >90 mL/min (ref >90)
GFR calc Af Amer: >90 mL/min (ref >90)
GFR calc Af Amer: >90 mL/min (ref >90)
GFR calc non Af Amer: >90 mL/min (ref >90)
GFR calc non Af Amer: >90 mL/min (ref >90)
GFR calc non Af Amer: >90 mL/min (ref >90)
GFR calc non Af Amer: >90 mL/min (ref >90)
GFR calc non Af Amer: >90 mL/min (ref >90)
GFR calc non Af Amer: >90 mL/min (ref >90)
GLUCOSE: 117 mg/dL — AB (ref 70–99)
GLUCOSE: 119 mg/dL — AB (ref 70–99)
GLUCOSE: 149 mg/dL — AB (ref 70–99)
GLUCOSE: 99 mg/dL (ref 70–99)
Glucose, Bld: 100 mg/dL — ABNORMAL HIGH (ref 70–99)
Glucose, Bld: 93 mg/dL (ref 70–99)
POTASSIUM: 3.4 meq/L — AB (ref 3.7–5.3)
POTASSIUM: 3.3 meq/L — AB (ref 3.7–5.3)
POTASSIUM: 3.5 meq/L — AB (ref 3.7–5.3)
Potassium: 3.6 mEq/L — ABNORMAL LOW (ref 3.7–5.3)
Potassium: 3.4 mEq/L — ABNORMAL LOW (ref 3.7–5.3)
Potassium: 3.5 mEq/L — ABNORMAL LOW (ref 3.7–5.3)
SODIUM: 116 meq/L — AB (ref 137–147)
SODIUM: 116 meq/L — AB (ref 137–147)
SODIUM: 121 meq/L — AB (ref 137–147)
SODIUM: 123 meq/L — AB (ref 137–147)
SODIUM: 126 meq/L — AB (ref 137–147)
Sodium: 123 mEq/L — ABNORMAL LOW (ref 137–147)

## 2013-08-30 LAB — CBC
HEMATOCRIT: 34.1 % — AB (ref 36.0–46.0)
Hemoglobin: 12.6 g/dL (ref 12.0–15.0)
MCH: 31.3 pg (ref 26.0–34.0)
MCHC: 37 g/dL — ABNORMAL HIGH (ref 30.0–36.0)
MCV: 84.6 fL (ref 78.0–100.0)
Platelets: 186 10*3/uL (ref 150–400)
RBC: 4.03 MIL/uL (ref 3.87–5.11)
RDW: 12.2 % (ref 11.5–15.5)
WBC: 9.6 10*3/uL (ref 4.0–10.5)

## 2013-08-30 LAB — HELICOBACTER PYLORI ABS-IGG+IGA, BLD
H Pylori IgG: 0.66 {ISR}
HELICOBACTER PYLORI AB, IGA: 4.5 U/mL (ref <9.0)

## 2013-08-30 LAB — MRSA PCR SCREENING: MRSA by PCR: NEGATIVE

## 2013-08-30 LAB — PHOSPHORUS: Phosphorus: 3.1 mg/dL (ref 2.3–4.6)

## 2013-08-30 LAB — SODIUM, URINE, RANDOM: Sodium, Ur: 68 mEq/L

## 2013-08-30 LAB — OSMOLALITY, URINE: Osmolality, Ur: 348 mOsm/kg — ABNORMAL LOW (ref 390–1090)

## 2013-08-30 LAB — MAGNESIUM: Magnesium: 1.5 mg/dL (ref 1.5–2.5)

## 2013-08-30 MED ORDER — METRONIDAZOLE IN NACL 5-0.79 MG/ML-% IV SOLN
500.0000 mg | Freq: Four times a day (QID) | INTRAVENOUS | Status: DC
Start: 2013-08-30 — End: 2013-09-01
  Administered 2013-08-30 – 2013-09-01 (×12): 500 mg via INTRAVENOUS
  Filled 2013-08-30 (×18): qty 100

## 2013-08-30 MED ORDER — SODIUM CHLORIDE 0.9 % IV SOLN: 250.0000 mL | INTRAVENOUS | Status: DC | PRN

## 2013-08-30 MED ORDER — ONDANSETRON HCL 4 MG/2ML IJ SOLN: 4.0000 mg | Freq: Four times a day (QID) | INTRAMUSCULAR | Status: DC | PRN

## 2013-08-30 MED ORDER — CIPROFLOXACIN IN D5W 400 MG/200ML IV SOLN
400.0000 mg | Freq: Two times a day (BID) | INTRAVENOUS | Status: DC
  Administered 2013-08-30 – 2013-08-31 (×2): 400 mg via INTRAVENOUS
  Filled 2013-08-30 (×5): qty 200

## 2013-08-30 MED ORDER — IPRATROPIUM BROMIDE 0.02 % IN SOLN: 0.5000 mg | Freq: Four times a day (QID) | RESPIRATORY_TRACT | Status: DC

## 2013-08-30 MED ORDER — LORAZEPAM 2 MG/ML IJ SOLN: 1.0000 mg | INTRAMUSCULAR | Status: DC | PRN

## 2013-08-30 MED ORDER — POTASSIUM CHLORIDE CRYS ER 20 MEQ PO TBCR
40.0000 meq | EXTENDED_RELEASE_TABLET | Freq: Once | ORAL | Status: AC
  Administered 2013-08-30: 40 meq via ORAL
  Filled 2013-08-30: qty 2

## 2013-08-30 MED ORDER — SODIUM CHLORIDE 0.9 % IV SOLN
INTRAVENOUS | Status: DC
  Administered 2013-08-30: 02:00:00 via INTRAVENOUS

## 2013-08-30 MED ORDER — ALBUTEROL SULFATE (2.5 MG/3ML) 0.083% IN NEBU: 2.5000 mg | INHALATION_SOLUTION | RESPIRATORY_TRACT | Status: DC | PRN

## 2013-08-30 MED ORDER — IPRATROPIUM-ALBUTEROL 0.5-2.5 (3) MG/3ML IN SOLN
3.0000 mL | Freq: Four times a day (QID) | RESPIRATORY_TRACT | Status: DC
  Administered 2013-08-30 – 2013-08-31 (×3): 3 mL via RESPIRATORY_TRACT
  Filled 2013-08-30 (×6): qty 3

## 2013-08-30 MED ORDER — ASPIRIN 81 MG PO CHEW
81.0000 mg | CHEWABLE_TABLET | Freq: Every day | ORAL | Status: DC
  Administered 2013-08-30 – 2013-09-01 (×3): 81 mg via ORAL
  Filled 2013-08-30 (×4): qty 1

## 2013-08-30 MED ORDER — DEXTROSE 5 % IV SOLN
2.0000 g | Freq: Once | INTRAVENOUS | Status: AC
  Administered 2013-08-30: 2 g via INTRAVENOUS
  Filled 2013-08-30 (×2): qty 4

## 2013-08-30 MED ORDER — HEPARIN SODIUM (PORCINE) 5000 UNIT/ML IJ SOLN
5000.0000 [IU] | Freq: Three times a day (TID) | INTRAMUSCULAR | Status: DC
  Administered 2013-08-30 – 2013-09-01 (×8): 5000 [IU] via SUBCUTANEOUS
  Filled 2013-08-30 (×13): qty 1

## 2013-08-30 MED ORDER — ALBUTEROL SULFATE (2.5 MG/3ML) 0.083% IN NEBU: 2.5000 mg | INHALATION_SOLUTION | Freq: Four times a day (QID) | RESPIRATORY_TRACT | Status: DC

## 2013-08-30 NOTE — H&P (Signed)
Reviewed.  Zlaty Alexa, MD Pulmonary and Critical Care Medicine [facility] Pager: [phone]  

## 2013-08-30 NOTE — H&P (Signed)
PULMONARY / CRITICAL CARE MEDICINE   Name: Christina Hale MRN: [id number] DOB: [birth year]    ADMISSION DATE:  08/29/2013  REFERRING MD :  Dr. Dina Rich  PRIMARY SERVICE: PCCM   CHIEF COMPLAINT:  Hyponatremia  BRIEF PATIENT DESCRIPTION: 63 y/o F with 48 hours n/v/d admitted to [facility] 3/27 after seizure in setting of Na 113.    SIGNIFICANT EVENTS / STUDIES:  3/27 - Admit with n/v, hyponatremia (113) with seizure in ER  LINES / TUBES:   CULTURES: Stool Culture 3/26>>> C-Diff PCR 3/26>>>  ANTIBIOTICS: Flagyl 3/26>>>  HISTORY OF PRESENT ILLNESS:  63 y/o F, current smoker, with PMH of HLD, HTN, Anxiety who presented to [facility] ER on 3/27 with approximately 48 hours of nausea, vomiting & diarrhea.  Patient reports symptoms began with chills, decreased appetite, lower abdominal cramping and diarrhea.  She notes approx 12 episodes of diarrhea with last episode on 3/26 am as well as vomiting.  She had been seen by her PCP on 3/25 for same with serum sodium of 122 and was treated with phenergan and hyoscyamine.  She continued to feel poorly with progression to dizziness and weakness.    ER work up noted serum sodium of 113, K 3.4, chloride of 75, sr cr of 0.49 and alkaline phosphatase of 122.  Patient suffered a seizure while in the emergency room.  She was treated with 100 ml of 3% saline and PCCM called for ICU admission. Pt & family deny abx use over last 3 months. No sick contacts, no recent exposures.  Denies blood in stool, weight loss, headache, chest pain, epigastric pain.    PAST MEDICAL HISTORY :  Past Medical History  Diagnosis Date  . Hyperlipidemia   . Hypertension   . Prediabetes   . Vitamin D deficiency   . Anxiety    Past Surgical History  Procedure Laterality Date  . Lasik Bilateral 2002  . Carpal tunnel release Right 2000  . Abdominal hysterectomy  1999  . Cesarean section      x2   Prior to Admission medications   Medication Sig Start Date End Date Taking? Authorizing  Provider  ALPRAZolam Duanne Moron) 1 MG tablet TAKE 1/2 TO 1 TABLET BY MOUTH DAILY AS NEEDED FOR ANXIETY 08/26/13  Yes Vicie Mutters, PA-C  atorvastatin (LIPITOR) 20 MG tablet TAKE 1 TABLET EVERY DAY 07/25/13  Yes Vicie Mutters, PA-C  BABY ASPIRIN PO Take 81 mg by mouth daily.   Yes Historical Provider, MD  Cholecalciferol (VITAMIN D PO) Take 2,000 Int'l Units by mouth 2 (two) times daily.    Yes Historical Provider, MD  esomeprazole (NEXIUM) 40 MG capsule Take 40 mg by mouth daily at 12 noon.   Yes Historical Provider, MD  hydrochlorothiazide (HYDRODIURIL) 25 MG tablet Take 25 mg by mouth daily.   Yes Historical Provider, MD  hyoscyamine (LEVSIN/SL) 0.125 MG SL tablet Place 1 tablet (0.125 mg total) under the tongue every 4 (four) hours as needed for cramping (nausea, diarrhea). 08/28/13  Yes Vicie Mutters, PA-C  MAGNESIUM PO Take 250 mg by mouth every other day.   Yes Historical Provider, MD  Multiple Vitamins-Minerals (MULTIVITAMIN PO) Take by mouth daily.   Yes Historical Provider, MD  promethazine (PHENERGAN) 12.5 MG tablet Take 1 tablet (12.5 mg total) by mouth every 6 (six) hours as needed for nausea or vomiting. 08/28/13  Yes Vicie Mutters, PA-C   Allergies  Allergen Reactions  . Codeine Nausea Only  . Iodine Nausea Only  . Prednisolone Nausea Only  FAMILY HISTORY:  Family History  Problem Relation Age of Onset  . Heart disease Mother   . Hypertension Mother   . Heart attack Mother   . Pneumonia Father    SOCIAL HISTORY:  reports that she quit smoking about 7 years ago. She does not have any smokeless tobacco history on file. She reports that she does not drink alcohol or use illicit drugs.  REVIEW OF SYSTEMS: See HPI.  All systems reviewed and otherwise negative.   SUBJECTIVE:   VITAL SIGNS: Temp:  [97.8 F (36.6 C)] 97.8 F (36.6 C) (03/26 2115) Pulse Rate:  [88-96] 96 (03/26 2317) Resp:  [18-20] 18 (03/26 2317) BP: (119-157)/(62-95) 157/62 mmHg (03/26 2317) SpO2:  [98  %] 98 % (03/26 2115)  INTAKE / OUTPUT: Intake/Output   None     PHYSICAL EXAMINATION: General:  wdwn adult female in NAD Neuro:  Awake, alert, mild lethargy, MAE HEENT:  Mm pink/dry, no jvd Cardiovascular:  s1s2 rrr, no m/r/g Lungs:  resp's even/non-labored, lungs bilaterally with wheezing  Abdomen:  Round/soft, bsx4 active Musculoskeletal:  No acute deformities  Skin:  Warm/dry, no edema   LABS:  CBC  Recent Labs Lab 08/26/13 1139 08/28/13 1642 08/29/13 2000  WBC 8.4 10.8* 11.2*  HGB 15.2* 14.2 14.3  HCT 44.3 40.9 38.7  PLT 263 266 223   Coag's No results found for this basename: APTT, INR,  in the last 168 hours BMET  Recent Labs Lab 08/26/13 1139 08/28/13 1642 08/29/13 2000  NA 135 122* 113*  K 3.9 4.0 3.4*  CL 96 87* 75*  CO2 28 27 22   BUN 7 7 5*  CREATININE 0.54 0.45* 0.49*  GLUCOSE 102* 120* 131*   Electrolytes  Recent Labs Lab 08/26/13 1139 08/28/13 1642 08/29/13 2000  CALCIUM 9.7 9.5 9.2  MG 1.8  --   --    Sepsis Markers No results found for this basename: LATICACIDVEN, PROCALCITON, O2SATVEN,  in the last 168 hours ABG No results found for this basename: PHART, PCO2ART, PO2ART,  in the last 168 hours Liver Enzymes  Recent Labs Lab 08/26/13 1139 08/28/13 1642 08/29/13 2000  AST 17 23 26   ALT 15 15 14   ALKPHOS 116 117 122*  BILITOT 0.5 0.6 0.7  ALBUMIN 4.1 4.1 3.8   Cardiac Enzymes No results found for this basename: TROPONINI, PROBNP,  in the last 168 hours Glucose No results found for this basename: GLUCAP,  in the last 168 hours  Imaging Dg Abd 1 View  08/29/2013   CLINICAL DATA:  Nausea, vomiting, diarrhea.  EXAM: ABDOMEN - 1 VIEW  COMPARISON:  DG HIP COMPLETE*R* dated 02/01/2010  FINDINGS: There is an overall paucity of bowel gas. A small amount of gas is present in the central abdomen without evidence of bowel dilatation. No abnormal calcification is seen. No gross intraperitoneal free air is identified. Advanced  osteoarthrosis is again seen involving the right hip.  IMPRESSION: Nonspecific bowel gas pattern.   Electronically Signed   By: Logan Bores   On: 08/29/2013 22:40    ASSESSMENT / PLAN:  PULMONARY A: At Risk Aspiration - in setting of N/V Tobacco Abuse Presumed COPD - wheezing on exam, no PFT's to quantify  P:   -aspiration precautions  -oxygen to support sats > 92% -scheduled BD's in setting of smoker  CARDIOVASCULAR A:  Hx HTN HLD P:  -hold home HCTZ -hydration as below  RENAL A:   Hyponatremia - s/p 100 ml of 3% Saline in ER  Hypochloremia P:   -NS at 100 ml/hr -correction of no more than 8 meq/L 24-hour period -Q2 BMP until Na > 120 -assess urine sodium, osmolarity   GASTROINTESTINAL A:   Nausea / Vomiting  P:   -PRN zofran  -clear liquid diet if patient wants / tolerates  HEMATOLOGIC A:   No acute issues  P:  -monitor CBC  -heparin for DVT prophylaxis   INFECTIOUS A:   N/V/D  P:   -assess stool culture, c-diff (low suspicion for c-diff as no abx in last 3 months) -empiric flagyl  -assess UA  ENDOCRINE A:   Hyperglycemia  P:   -monitor BMP glucose  NEUROLOGIC A:   Acute Metabolic Encephalopathy  New Onset Seizure  Situational Anxiety - 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  CRITICAL CARE: The patient is critically ill with multiple organ systems failure and requires high complexity decision making for assessment and support, frequent evaluation and titration of therapies, application of advanced monitoring technologies and extensive interpretation of multiple databases. Critical Care Time devoted to  patient care services described in this note is 45 minutes.   08/30/2013, 12:13 AM

## 2013-08-30 NOTE — Progress Notes (Signed)
Report called to Clyda Hurdle, RRT at the [place].

## 2013-08-30 NOTE — H&P (Signed)
PULMONARY / CRITICAL CARE MEDICINE   Name: Christina Hale MRN: [id number] DOB: [birth year]    ADMISSION DATE:  08/29/2013  REFERRING MD :  Dr. Dina Rich  PRIMARY SERVICE: PCCM   CHIEF COMPLAINT:  Hyponatremia  BRIEF PATIENT DESCRIPTION: 63 y/o F with 48 hours n/v/d admitted to [facility] 3/27 after seizure in setting of Na 113.    SIGNIFICANT EVENTS / STUDIES:  3/27 - Admit with n/v, hyponatremia (113) with seizure in ER 3/27- improved NA  LINES / TUBES:  CULTURES: Stool Culture 3/26>>> C-Diff PCR 3/26>>>  ANTIBIOTICS: Flagyl 3/26>>>  HISTORY OF PRESENT ILLNESS:  63 y/o F, current smoker, with PMH of HLD, HTN, Anxiety who presented to [facility] ER on 3/27 with approximately 48 hours of nausea, vomiting & diarrhea.  Patient reports symptoms began with chills, decreased appetite, lower abdominal cramping and diarrhea.  She notes approx 12 episodes of diarrhea with last episode on 3/26 am as well as vomiting.  She had been seen by her PCP on 3/25 for same with serum sodium of 122 and was treated with phenergan and hyoscyamine.  She continued to feel poorly with progression to dizziness and weakness.    ER work up noted serum sodium of 113, K 3.4, chloride of 75, sr cr of 0.49 and alkaline phosphatase of 122.  Patient suffered a seizure while in the emergency room.  She was treated with 100 ml of 3% saline and PCCM called for ICU admission. Pt & family deny abx use over last 3 months. No sick contacts, no recent exposures.  Denies blood in stool, weight loss, headache, chest pain, epigastric pain.    PAST MEDICAL HISTORY :  Past Medical History  Diagnosis Date  . Hyperlipidemia   . Hypertension   . Prediabetes   . Vitamin D deficiency   . Anxiety    Past Surgical History  Procedure Laterality Date  . Lasik Bilateral 2002  . Carpal tunnel release Right 2000  . Abdominal hysterectomy  1999  . Cesarean section      x2   Prior to Admission medications   Medication Sig Start Date End Date Taking?  Authorizing Provider  ALPRAZolam Duanne Moron) 1 MG tablet TAKE 1/2 TO 1 TABLET BY MOUTH DAILY AS NEEDED FOR ANXIETY 08/26/13  Yes Vicie Mutters, PA-C  atorvastatin (LIPITOR) 20 MG tablet TAKE 1 TABLET EVERY DAY 07/25/13  Yes Vicie Mutters, PA-C  BABY ASPIRIN PO Take 81 mg by mouth daily.   Yes Historical Provider, MD  Cholecalciferol (VITAMIN D PO) Take 2,000 Int'l Units by mouth 2 (two) times daily.    Yes Historical Provider, MD  esomeprazole (NEXIUM) 40 MG capsule Take 40 mg by mouth daily at 12 noon.   Yes Historical Provider, MD  hydrochlorothiazide (HYDRODIURIL) 25 MG tablet Take 25 mg by mouth daily.   Yes Historical Provider, MD  hyoscyamine (LEVSIN/SL) 0.125 MG SL tablet Place 1 tablet (0.125 mg total) under the tongue every 4 (four) hours as needed for cramping (nausea, diarrhea). 08/28/13  Yes Vicie Mutters, PA-C  MAGNESIUM PO Take 250 mg by mouth every other day.   Yes Historical Provider, MD  Multiple Vitamins-Minerals (MULTIVITAMIN PO) Take by mouth daily.   Yes Historical Provider, MD  promethazine (PHENERGAN) 12.5 MG tablet Take 1 tablet (12.5 mg total) by mouth every 6 (six) hours as needed for nausea or vomiting. 08/28/13  Yes Vicie Mutters, PA-C   Allergies  Allergen Reactions  . Codeine Nausea Only  . Iodine Nausea Only  . Prednisolone  Nausea Only    FAMILY HISTORY:  Family History  Problem Relation Age of Onset  . Heart disease Mother   . Hypertension Mother   . Heart attack Mother   . Pneumonia Father    SOCIAL HISTORY:  reports that she has been smoking Cigarettes.  She has a 40 pack-year smoking history. She does not have any smokeless tobacco history on file. She reports that she does not drink alcohol or use illicit drugs.  REVIEW OF SYSTEMS: See HPI.  All systems reviewed and otherwise negative.   SUBJECTIVE:   VITAL SIGNS: Temp:  [97.8 F (36.6 C)-99.3 F (37.4 C)] 99.3 F (37.4 C) (03/27 1211) Pulse Rate:  [82-97] 90 (03/27 1300) Resp:  [18-27] 22  (03/27 1300) BP: (103-157)/(53-110) 114/64 mmHg (03/27 1300) SpO2:  [90 %-100 %] 90 % (03/27 1300) FiO2 (%):  [100 %] 100 % (03/26 2315) Weight:  [90 kg (198 lb 6.6 oz)] 90 kg (198 lb 6.6 oz) (03/27 0500)  INTAKE / OUTPUT: Intake/Output     03/26 0701 - 03/27 0700 03/27 0701 - 03/28 0700   I.V. (mL/kg) 466.7 (5.2) 500 (5.6)   IV Piggyback 200 0   Total Intake(mL/kg) 666.7 (7.4) 500 (5.6)   Urine (mL/kg/hr) 650 1400 (2.5)   Total Output 650 1400   Net +16.7 -900          PHYSICAL EXAMINATION: General:  wdwn adult female in NAD Neuro:  Awake, alert, mild lethargy, MAE HEENT:  Mm pink/dry, no jvd Cardiovascular:  s1s2 rrr, no m/r/g Lungs:  resp's even/non-labored, lungs bilaterally with wheezing  Abdomen:  Round/soft, bsx4 active, no r/g Musculoskeletal:  No acute deformities  Skin:  Warm/dry, no edema   LABS:  CBC  Recent Labs Lab 08/28/13 1642 08/29/13 2000 08/30/13 0654  WBC 10.8* 11.2* 9.6  HGB 14.2 14.3 12.6  HCT 40.9 38.7 34.1*  PLT 266 223 186   Coag's No results found for this basename: APTT, INR,  in the last 168 hours BMET  Recent Labs Lab 08/30/13 0628 08/30/13 0835 08/30/13 1043  NA 121* 123* 123*  K 3.3* 3.5* 3.4*  CL 85* 87* 87*  CO2 23 26 22   BUN 4* 4* 4*  CREATININE 0.46* 0.55 0.50  GLUCOSE 99 100* 93   Electrolytes  Recent Labs Lab 08/26/13 1139  08/30/13 0628 08/30/13 0654 08/30/13 0835 08/30/13 1043  CALCIUM 9.7  < > 8.0*  --  8.2* 8.3*  MG 1.8  --   --  1.5  --   --   PHOS  --   --   --  3.1  --   --   < > = values in this interval not displayed. Sepsis Markers No results found for this basename: LATICACIDVEN, PROCALCITON, O2SATVEN,  in the last 168 hours ABG No results found for this basename: PHART, PCO2ART, PO2ART,  in the last 168 hours Liver Enzymes  Recent Labs Lab 08/26/13 1139 08/28/13 1642 08/29/13 2000  AST 17 23 26   ALT 15 15 14   ALKPHOS 116 117 122*  BILITOT 0.5 0.6 0.7  ALBUMIN 4.1 4.1 3.8   Cardiac  Enzymes No results found for this basename: TROPONINI, PROBNP,  in the last 168 hours Glucose No results found for this basename: GLUCAP,  in the last 168 hours  Imaging Dg Abd 1 View  08/29/2013   CLINICAL DATA:  Nausea, vomiting, diarrhea.  EXAM: ABDOMEN - 1 VIEW  COMPARISON:  DG HIP COMPLETE*R* dated 02/01/2010  FINDINGS: There  is an overall paucity of bowel gas. A small amount of gas is present in the central abdomen without evidence of bowel dilatation. No abnormal calcification is seen. No gross intraperitoneal free air is identified. 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  IMPRESSION: Nonspecific bowel gas pattern.   Electronically Signed   By: Logan Bores   On: 08/29/2013 22:40    ASSESSMENT / PLAN:  PULMONARY A: At Risk Aspiration - in setting of N/V Tobacco Abuse Presumed COPD - wheezing on exam, no PFT's to quantify  P:   -aspiration precautions  -oxygen to support sats > 92% -scheduled BD's in setting of smoker -pcxr in am for edema  CARDIOVASCULAR A:  Hx HTN HLD P:  -hold home HCTZ -hydration as below -tele  RENAL A:   Hyponatremia - s/p 100 ml of 3% Saline in ER Hypochloremia P:   -NS, reduce as rate up in 16 hrs by 10, this is acute hyponatremia so goals can be 1 in hr , but will go with 0.5 at na 123 now -assess urine sodium, osmolarity -reviewed, osm supports hypovolemia, na not so much -bmet q6h  GASTROINTESTINAL A:   Nausea / Vomiting gastroentritis  P:   -PRN zofran  -clear liquid diet if patient wants / tolerates--> did, advance fulls -see ID  HEMATOLOGIC A:   DVT prevention P:  -monitor CBC  -heparin for DVT prophylaxis   INFECTIOUS A:   N/V/D R/o gastroenteritis P:   -assess stool culture, c-diff (low suspicion for c-diff as no abx in last 3 months) - pending -empiric flagyl  -Add quinolone  ENDOCRINE A:   Hyperglycemia  P:   -monitor BMP glucose -assess tsh  NEUROLOGIC A:   Acute Metabolic  Encephalopathy  New Onset Seizure  Situational Anxiety - 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  CRITICAL CARE: The patient is critically ill with multiple organ systems failure and requires high complexity decision making for assessment and support, frequent evaluation and titration of therapies, application of advanced monitoring technologies and extensive interpretation of multiple databases. Critical Care Time devoted to patient care services described in this note is 30 minutes.   08/30/2013, 1:12 PM   Lavon Paganini. Titus Mould, MD, [place] Pgr: [facility]

## 2013-08-30 NOTE — ED Provider Notes (Signed)
CSN: [id number]     Arrival date & time 08/29/13  2112 History   First MD Initiated Contact with Patient 08/29/13 2129     Chief Complaint  Patient presents with  . Nausea  . Diarrhea     (Consider location/radiation/quality/duration/timing/severity/associated sxs/prior Treatment) HPI  This is a 63 year old female with a history of hypertension, diabetes who presents with vomiting and diarrhea. Patient states that she was seen by her primary physician yesterday. She reports a 2 day history of multiple episodes of diarrhea and one episode of vomiting. Patient states that since seeing her doctor yesterday she's had 12-14 stools that have been very loose. Patient reports being placed on anti-nausea medication. She states she feels progressively weak and "jittery." She denies any abdominal pain or fevers. She does endorse chills and a dry cough. She denies any sick contacts.  Past Medical History  Diagnosis Date  . Hyperlipidemia   . Hypertension   . Prediabetes   . Vitamin D deficiency   . Anxiety    Past Surgical History  Procedure Laterality Date  . Lasik Bilateral 2002  . Carpal tunnel release Right 2000  . Abdominal hysterectomy  1999  . Cesarean section      x2   Family History  Problem Relation Age of Onset  . Heart disease Mother   . Hypertension Mother   . Heart attack Mother   . Pneumonia Father    History  Substance Use Topics  . Smoking status: Current Every Day Smoker -- 1.00 packs/day for 40 years    Types: Cigarettes  . Smokeless tobacco: Not on file  . Alcohol Use: No   OB History   Grav Para Term Preterm Abortions TAB SAB Ect Mult Living                 Review of Systems  Constitutional: Negative for fever.  Respiratory: Positive for cough. Negative for chest tightness and shortness of breath.   Cardiovascular: Negative for chest pain.  Gastrointestinal: Positive for nausea, vomiting and diarrhea. Negative for abdominal pain.  Genitourinary:  Negative for dysuria.  Musculoskeletal: Negative for back pain.  Skin: Negative for rash.  Neurological: Negative for headaches.  Psychiatric/Behavioral: Negative for confusion.  All other systems reviewed and are negative.      Allergies  Codeine; Iodine; and Prednisolone  Home Medications   Current Outpatient Rx  Name  Route  Sig  Dispense  Refill  . ALPRAZolam (XANAX) 1 MG tablet      TAKE 1/2 TO 1 TABLET BY MOUTH DAILY AS NEEDED FOR ANXIETY   90 tablet   1   . atorvastatin (LIPITOR) 20 MG tablet      TAKE 1 TABLET EVERY DAY   30 tablet   2   . BABY ASPIRIN PO   Oral   Take 81 mg by mouth daily.         . Cholecalciferol (VITAMIN D PO)   Oral   Take 2,000 Int'l Units by mouth 2 (two) times daily.          Marland Kitchen esomeprazole (NEXIUM) 40 MG capsule   Oral   Take 40 mg by mouth daily at 12 noon.         . hydrochlorothiazide (HYDRODIURIL) 25 MG tablet   Oral   Take 25 mg by mouth daily.         . hyoscyamine (LEVSIN/SL) 0.125 MG SL tablet   Sublingual   Place 1 tablet (0.125 mg total) under  the tongue every 4 (four) hours as needed for cramping (nausea, diarrhea).   50 tablet   1   . MAGNESIUM PO   Oral   Take 250 mg by mouth every other day.         . Multiple Vitamins-Minerals (MULTIVITAMIN PO)   Oral   Take by mouth daily.         . promethazine (PHENERGAN) 12.5 MG tablet   Oral   Take 1 tablet (12.5 mg total) by mouth every 6 (six) hours as needed for nausea or vomiting.   60 tablet   1    BP 157/62  Pulse 96  Temp(Src) 97.8 F (36.6 C) (Oral)  Resp 18  SpO2 100% Physical Exam  Nursing note and vitals reviewed. Constitutional: She is oriented to person, place, and time.  Pale, ill-appearing, but nontoxic  HENT:  Head: Normocephalic and atraumatic.  Mucous membranes dry  Eyes: Pupils are equal, round, and reactive to light.  Neck: Neck supple.  Cardiovascular: Normal rate, regular rhythm and normal heart sounds.    Pulmonary/Chest: Effort normal. No respiratory distress. She has wheezes.  Intermittent expiratory squeak  Abdominal: Soft. Bowel sounds are normal. There is no tenderness. There is no rebound and no guarding.  Musculoskeletal: She exhibits no edema.  Neurological: She is alert and oriented to person, place, and time.  Skin: Skin is warm.  Psychiatric: She has a normal mood and affect.    ED Course  Procedures (including critical care time)  CRITICAL CARE Performed by: Thayer Jew, F   Total critical care time: 45 min  Critical care time was exclusive of separately billable procedures and treating other patients.  Critical care was necessary to treat or prevent imminent or life-threatening deterioration.  Critical care was time spent personally by me on the following activities: development of treatment plan with patient and/or surrogate as well as nursing, discussions with consultants, evaluation of patient's response to treatment, examination of patient, obtaining history from patient or surrogate, ordering and performing treatments and interventions, ordering and review of laboratory studies, ordering and review of radiographic studies, pulse oximetry and re-evaluation of patient's condition. Labs Review Labs Reviewed  CBC WITH DIFFERENTIAL - Abnormal; Notable for the following:    WBC 11.2 (*)    MCHC 37.0 (*)    Neutro Abs 8.4 (*)    All other components within normal limits  COMPREHENSIVE METABOLIC PANEL - Abnormal; Notable for the following:    Sodium 113 (*)    Potassium 3.4 (*)    Chloride 75 (*)    Glucose, Bld 131 (*)    BUN 5 (*)    Creatinine, Ser 0.49 (*)    Alkaline Phosphatase 122 (*)    All other components within normal limits  URINALYSIS, ROUTINE W REFLEX MICROSCOPIC - Abnormal; Notable for the following:    APPearance CLOUDY (*)    Nitrite POSITIVE (*)    Leukocytes, UA TRACE (*)    All other components within normal limits  URINE MICROSCOPIC-ADD  ON - Abnormal; Notable for the following:    Squamous Epithelial / LPF FEW (*)    Bacteria, UA MANY (*)    All other components within normal limits  BASIC METABOLIC PANEL - Abnormal; Notable for the following:    Sodium 116 (*)    Potassium 3.6 (*)    Chloride 82 (*)    Glucose, Bld 149 (*)    BUN 5 (*)    Creatinine, Ser 0.46 (*)  Calcium 7.6 (*)    All other components within normal limits  STOOL CULTURE  CLOSTRIDIUM DIFFICILE BY PCR  BASIC METABOLIC PANEL  BASIC METABOLIC PANEL  BASIC METABOLIC PANEL  BASIC METABOLIC PANEL  BASIC METABOLIC PANEL  BASIC METABOLIC PANEL  BASIC METABOLIC PANEL  BASIC METABOLIC PANEL  BASIC METABOLIC PANEL  BASIC METABOLIC PANEL  BASIC METABOLIC PANEL  CBC  MAGNESIUM  PHOSPHORUS  SODIUM, URINE, RANDOM  OSMOLALITY, URINE   Imaging Review Dg Abd 1 View  08/29/2013   CLINICAL DATA:  Nausea, vomiting, diarrhea.  EXAM: ABDOMEN - 1 VIEW  COMPARISON:  DG HIP COMPLETE*R* dated 02/01/2010  FINDINGS: There is an overall paucity of bowel gas. A small amount of gas is present in the central abdomen without evidence of bowel dilatation. No abnormal calcification is seen. No gross intraperitoneal free air is identified. 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  IMPRESSION: Nonspecific bowel gas pattern.   Electronically Signed   By: Logan Bores   On: 08/29/2013 22:40     EKG Interpretation None      MDM   Final diagnoses:  Seizure  Hyponatremia  Vomiting and diarrhea    Patient presents with nausea and diarrhea x2 days. She is ill-appearing but nontoxic. Initial vital signs are reassuring. She has no abdominal pain. Patient does have scant expiratory wheezing on exam but is a current every day smoker. Basic labwork was obtained as well as an abdominal KUB.  Patient was given a normal saline bolus and Zofran.  I was called emergently to the patient's room with her actively seizing. Upon my arrival, patient was no longer  seizing but appeared to be postictal. She dropped her O2 sats to 55%. Patient was bagged and a nasal airway was placed with return of her oxygen saturations to 98%.  At time of seizure, BMP had not resulted.  However, noted that sodium one day prior was 122. Sodium today 113. Suspect seizure secondary to hyponatremia given patient has no history of seizures. Patient was given 1 mg of Ativan and was given 100 cc of 3% saline. On recheck, patient is now alert and oriented. She is sleepy but appears to be returning to her baseline. 2 large bore IVs were established. Patient was given a second normal saline bolus. Have discussed with critical care who will admit.     Merryl Hacker, MD 08/30/13 [phone]

## 2013-08-31 ENCOUNTER — Inpatient Hospital Stay (HOSPITAL_COMMUNITY): Payer: BC Managed Care – PPO

## 2013-08-31 DIAGNOSIS — R569 Unspecified convulsions: Secondary | ICD-10-CM | POA: Diagnosis present

## 2013-08-31 DIAGNOSIS — A0472 Enterocolitis due to Clostridium difficile, not specified as recurrent: Secondary | ICD-10-CM | POA: Diagnosis present

## 2013-08-31 DIAGNOSIS — F17200 Nicotine dependence, unspecified, uncomplicated: Secondary | ICD-10-CM | POA: Diagnosis present

## 2013-08-31 LAB — CBC WITH DIFFERENTIAL/PLATELET
Basophils Absolute: 0 10*3/uL (ref 0.0–0.1)
Basophils Relative: 0 % (ref 0–1)
EOS PCT: 1 % (ref 0–5)
Eosinophils Absolute: 0.1 10*3/uL (ref 0.0–0.7)
HEMATOCRIT: 36.8 % (ref 36.0–46.0)
Hemoglobin: 13.1 g/dL (ref 12.0–15.0)
LYMPHS ABS: 1 10*3/uL (ref 0.7–4.0)
Lymphocytes Relative: 13 % (ref 12–46)
MCH: 31.5 pg (ref 26.0–34.0)
MCHC: 35.6 g/dL (ref 30.0–36.0)
MCV: 88.5 fL (ref 78.0–100.0)
Monocytes Absolute: 0.9 10*3/uL (ref 0.1–1.0)
Monocytes Relative: 12 % (ref 3–12)
NEUTROS ABS: 5.5 10*3/uL (ref 1.7–7.7)
Neutrophils Relative %: 74 % (ref 43–77)
Platelets: 175 10*3/uL (ref 150–400)
RBC: 4.16 MIL/uL (ref 3.87–5.11)
RDW: 12.8 % (ref 11.5–15.5)
WBC: 7.5 10*3/uL (ref 4.0–10.5)

## 2013-08-31 LAB — BASIC METABOLIC PANEL
BUN: 3 mg/dL — AB (ref 6–23)
BUN: 3 mg/dL — AB (ref 6–23)
BUN: 4 mg/dL — ABNORMAL LOW (ref 6–23)
CALCIUM: 8.8 mg/dL (ref 8.4–10.5)
CHLORIDE: 96 meq/L (ref 96–112)
CO2: 23 mEq/L (ref 19–32)
CO2: 25 meq/L (ref 19–32)
CO2: 26 mEq/L (ref 19–32)
CREATININE: 0.49 mg/dL — AB (ref 0.50–1.10)
Calcium: 8.4 mg/dL (ref 8.4–10.5)
Calcium: 8.8 mg/dL (ref 8.4–10.5)
Chloride: 96 mEq/L (ref 96–112)
Chloride: 97 mEq/L (ref 96–112)
Creatinine, Ser: 0.5 mg/dL (ref 0.50–1.10)
Creatinine, Ser: 0.5 mg/dL (ref 0.50–1.10)
GFR calc Af Amer: >90 mL/min (ref >90)
GFR calc Af Amer: >90 mL/min (ref >90)
GFR calc non Af Amer: >90 mL/min (ref >90)
GLUCOSE: 107 mg/dL — AB (ref 70–99)
GLUCOSE: 146 mg/dL — AB (ref 70–99)
GLUCOSE: 93 mg/dL (ref 70–99)
Potassium: 3.5 mEq/L — ABNORMAL LOW (ref 3.7–5.3)
Potassium: 3 mEq/L — ABNORMAL LOW (ref 3.7–5.3)
Potassium: 3.1 mEq/L — ABNORMAL LOW (ref 3.7–5.3)
SODIUM: 136 meq/L — AB (ref 137–147)
Sodium: 133 mEq/L — ABNORMAL LOW (ref 137–147)
Sodium: 136 mEq/L — ABNORMAL LOW (ref 137–147)

## 2013-08-31 LAB — COMPREHENSIVE METABOLIC PANEL
ALBUMIN: 3 g/dL — AB (ref 3.5–5.2)
ALT: 14 U/L (ref 0–35)
AST: 27 U/L (ref 0–37)
Alkaline Phosphatase: 95 U/L (ref 39–117)
BUN: 4 mg/dL — AB (ref 6–23)
CO2: 26 mEq/L (ref 19–32)
Calcium: 8.6 mg/dL (ref 8.4–10.5)
Chloride: 98 mEq/L (ref 96–112)
Creatinine, Ser: 0.54 mg/dL (ref 0.50–1.10)
GFR calc non Af Amer: >90 mL/min (ref >90)
GLUCOSE: 92 mg/dL (ref 70–99)
Potassium: 3.9 mEq/L (ref 3.7–5.3)
Sodium: 136 mEq/L — ABNORMAL LOW (ref 137–147)
TOTAL PROTEIN: 6.4 g/dL (ref 6.0–8.3)
Total Bilirubin: 0.7 mg/dL (ref 0.3–1.2)

## 2013-08-31 LAB — CLOSTRIDIUM DIFFICILE BY PCR: Toxigenic C. Difficile by PCR: POSITIVE — AB

## 2013-08-31 LAB — TSH: TSH: 2.013 u[IU]/mL (ref 0.350–4.500)

## 2013-08-31 MED ORDER — ALPRAZOLAM 0.5 MG PO TABS
0.5000 mg | ORAL_TABLET | Freq: Three times a day (TID) | ORAL | Status: DC | PRN
  Administered 2013-08-31: 0.5 mg via ORAL
  Filled 2013-08-31: qty 1

## 2013-08-31 MED ORDER — ALPRAZOLAM 0.5 MG PO TABS
0.5000 mg | ORAL_TABLET | Freq: Four times a day (QID) | ORAL | Status: DC | PRN
  Administered 2013-09-01 (×2): 0.5 mg via ORAL
  Filled 2013-08-31 (×2): qty 1

## 2013-08-31 MED ORDER — METRONIDAZOLE 500 MG PO TABS: 500.0000 mg | ORAL_TABLET | Freq: Three times a day (TID) | ORAL | Status: DC

## 2013-08-31 MED ORDER — IPRATROPIUM-ALBUTEROL 0.5-2.5 (3) MG/3ML IN SOLN: 3.0000 mL | Freq: Four times a day (QID) | RESPIRATORY_TRACT | Status: DC | PRN

## 2013-08-31 MED ORDER — ALPRAZOLAM 0.5 MG PO TABS: 0.5000 mg | ORAL_TABLET | Freq: Four times a day (QID) | ORAL | Status: DC

## 2013-08-31 NOTE — Progress Notes (Signed)
[facility] Progress Note [place] TEAM 1 - Stepdown/ICU TEAM   Christina Hale NUU:[id number] DOB: 07-Apr-1951 DOA: 08/29/2013 PCP: Alesia Richards, MD  Brief narrative: Christina Hale is a 63 y.o. female presenting on 08/29/2013 with  has a past medical history of Hyperlipidemia; Hypertension; Prediabetes;  smoker and Anxiety who presents with a seizure in setting of Vomiting/ profuse Diarrhea and Na+ of 113.    Subjective: Had the first BM since being hospitalized- loose- not watery. No other complaints. Very anxiuos to go home but due to discovery of lung mass, she understands she should stay for further work up esp as this mass may be causing SIADH and care the hyponatremia to recur.   Assessment/Plan: Principal Problem:   Seizures - likley from hyponatremia but due to finding of large lung mass, will asses for mets with CT head  Active Problems:   Hyponatremia - dehydration vs SIADH or both in setting of HCTZ use - studies for SIADH would be inaccurate as she has been receiving NS - Na+ much improved now- will stop IVF and follow Na in AM  Vomiting / diarrhea/ c diff colitis - cont Flagyl- stop Cipro    Lung mass - CT chest/ consulted pulm    Smoker - advised to quit  Anxiety - cont Xanax    Code Status: full code Family Communication: husband Disposition Plan: follow in SDU for drop in sodium after stopping IVF  Consultants: PCCM  Procedures: none  Antibiotics: Anti-infectives   Start     Dose/Rate Route Frequency Ordered Stop   08/31/13 1515  metroNIDAZOLE (FLAGYL) tablet 500 mg  Status:  Discontinued     500 mg Oral 3 times per day 08/31/13 1504 08/31/13 1505   08/30/13 1500  ciprofloxacin (CIPRO) IVPB 400 mg  Status:  Discontinued     400 mg 200 mL/hr over 60 Minutes Intravenous Every 12 hours 08/30/13 1321 08/31/13 1505   08/30/13 0130  metroNIDAZOLE (FLAGYL) IVPB 500 mg     500 mg 100 mL/hr over 60 Minutes Intravenous 4 times per day  08/30/13 0121         DVT prophylaxis: Heparin  Objective: Filed Weights   08/30/13 0500 08/30/13 1831  Weight: 90 kg (198 lb 6.6 oz) 86 kg (189 lb 9.5 oz)   Blood pressure 134/70, pulse 79, temperature 98 F (36.7 C), temperature source Oral, resp. rate 14, height 5\' 10"  (1.778 m), weight 86 kg (189 lb 9.5 oz), SpO2 98.00%.  Intake/Output Summary (Last 24 hours) at 08/31/13 1509 Last data filed at 08/31/13 1423  Gross per 24 hour  Intake 2807.5 ml  Output   1950 ml  Net  857.5 ml     Exam: General: No acute respiratory distress Lungs: Clear to auscultation bilaterally without wheezes or crackles Cardiovascular: Regular rate and rhythm without murmur gallop or rub normal S1 and S2 Abdomen: Nontender, nondistended, soft, bowel sounds positive, no rebound, no ascites, no appreciable mass Extremities: No significant cyanosis, clubbing, or edema bilateral lower extremities  Data Reviewed: Basic Metabolic Panel:  Recent Labs Lab 08/26/13 1139  08/30/13 0628 08/30/13 0654 08/30/13 0835 08/30/13 1043 08/30/13 1824 08/31/13 0037 08/31/13 0603  NA 135  < > 121*  --  123* 123* 126* 133* 136*  K 3.9  < > 3.3*  --  3.5* 3.4* 3.5* 3.5* 3.9  CL 96  < > 85*  --  87* 87* 90* 96 98  CO2 28  < > 23  --  26  22 23 23 26   GLUCOSE 102*  < > 99  --  100* 93 117* 93 92  BUN 7  < > 4*  --  4* 4* 3* 3* 4*  CREATININE 0.54  < > 0.46*  --  0.55 0.50 0.49* 0.49* 0.54  CALCIUM 9.7  < > 8.0*  --  8.2* 8.3* 8.4 8.4 8.6  MG 1.8  --   --  1.5  --   --   --   --   --   PHOS  --   --   --  3.1  --   --   --   --   --   < > = values in this interval not displayed. Liver Function Tests:  Recent Labs Lab 08/26/13 1139 08/28/13 1642 08/29/13 2000 08/31/13 0603  AST 17 23 26 27   ALT 15 15 14 14   ALKPHOS 116 117 122* 95  BILITOT 0.5 0.6 0.7 0.7  PROT 7.1 7.3 7.7 6.4  ALBUMIN 4.1 4.1 3.8 3.0*    Recent Labs Lab 08/28/13 1642  AMYLASE 23   No results found for this basename: AMMONIA,   in the last 168 hours CBC:  Recent Labs Lab 08/26/13 1139 08/28/13 1642 08/29/13 2000 08/30/13 0654 08/31/13 0603  WBC 8.4 10.8* 11.2* 9.6 7.5  NEUTROABS 6.2 9.0* 8.4*  --  5.5  HGB 15.2* 14.2 14.3 12.6 13.1  HCT 44.3 40.9 38.7 34.1* 36.8  MCV 90.0 87.6 85.8 84.6 88.5  PLT 263 266 223 186 175   Cardiac Enzymes: No results found for this basename: CKTOTAL, CKMB, CKMBINDEX, TROPONINI,  in the last 168 hours BNP (last 3 results) No results found for this basename: PROBNP,  in the last 8760 hours CBG: No results found for this basename: GLUCAP,  in the last 168 hours  Recent Results (from the past 240 hour(s))  MRSA PCR SCREENING     Status: None   Collection Time    08/30/13  4:48 AM      Result Value Ref Range Status   MRSA by PCR NEGATIVE  NEGATIVE Final   Comment:            The GeneXpert MRSA Assay (FDA     approved for NASAL specimens     only), is one component of a     comprehensive MRSA colonization     surveillance program. It is not     intended to diagnose MRSA     infection nor to guide or     monitor treatment for     MRSA infections.  CLOSTRIDIUM DIFFICILE BY PCR     Status: Abnormal   Collection Time    08/31/13  7:16 AM      Result Value Ref Range Status   C difficile by pcr POSITIVE (*) NEGATIVE Final   Comment: CRITICAL RESULT CALLED TO, READ BACK BY AND VERIFIED WITH:     OFORI E.,RN 08/31/13 1357 BY JONESJ     Studies:  Recent x-ray studies have been reviewed in detail by the Attending Physician  Scheduled Meds:  Scheduled Meds: . aspirin  81 mg Oral Daily  . heparin  5,000 Units Subcutaneous 3 times per day  . ipratropium-albuterol  3 mL Nebulization Q6H  . metronidazole  500 mg Intravenous 4 times per day   Continuous Infusions: . sodium chloride 50 mL/hr at 08/31/13 0600    Time spent on care of this patient: >35 min   Darrow Barreiro, MD  [facility] Office  [phone] Pager - Text Page per Shea Evans as per  below:  On-Call/Text Page:      [email or web address]  If 7PM-7AM, please contact night-coverage [email or web address] 08/31/2013, 3:09 PM   LOS: 2 days

## 2013-09-01 ENCOUNTER — Inpatient Hospital Stay (HOSPITAL_COMMUNITY): Payer: BC Managed Care – PPO

## 2013-09-01 DIAGNOSIS — R222 Localized swelling, mass and lump, trunk: Secondary | ICD-10-CM

## 2013-09-01 DIAGNOSIS — R160 Hepatomegaly, not elsewhere classified: Secondary | ICD-10-CM | POA: Diagnosis present

## 2013-09-01 DIAGNOSIS — K769 Liver disease, unspecified: Secondary | ICD-10-CM

## 2013-09-01 LAB — BASIC METABOLIC PANEL
BUN: 3 mg/dL — AB (ref 6–23)
BUN: 3 mg/dL — ABNORMAL LOW (ref 6–23)
CALCIUM: 8.9 mg/dL (ref 8.4–10.5)
CALCIUM: 9.3 mg/dL (ref 8.4–10.5)
CHLORIDE: 95 meq/L — AB (ref 96–112)
CHLORIDE: 98 meq/L (ref 96–112)
CO2: 24 mEq/L (ref 19–32)
CO2: 24 meq/L (ref 19–32)
CO2: 24 meq/L (ref 19–32)
CREATININE: 0.51 mg/dL (ref 0.50–1.10)
CREATININE: 0.48 mg/dL — AB (ref 0.50–1.10)
Calcium: 8.8 mg/dL (ref 8.4–10.5)
Chloride: 94 mEq/L — ABNORMAL LOW (ref 96–112)
Creatinine, Ser: 0.51 mg/dL (ref 0.50–1.10)
GFR calc Af Amer: >90 mL/min (ref >90)
GFR calc Af Amer: >90 mL/min (ref >90)
GFR calc non Af Amer: >90 mL/min (ref >90)
GFR calc non Af Amer: >90 mL/min (ref >90)
GLUCOSE: 102 mg/dL — AB (ref 70–99)
GLUCOSE: 108 mg/dL — AB (ref 70–99)
GLUCOSE: 121 mg/dL — AB (ref 70–99)
POTASSIUM: 3 meq/L — AB (ref 3.7–5.3)
Potassium: 3.5 mEq/L — ABNORMAL LOW (ref 3.7–5.3)
Potassium: 3.7 mEq/L (ref 3.7–5.3)
SODIUM: 133 meq/L — AB (ref 137–147)
Sodium: 133 mEq/L — ABNORMAL LOW (ref 137–147)
Sodium: 135 mEq/L — ABNORMAL LOW (ref 137–147)

## 2013-09-01 LAB — GLUCOSE, CAPILLARY: Glucose-Capillary: 98 mg/dL (ref 70–99)

## 2013-09-01 LAB — MAGNESIUM: Magnesium: 1.9 mg/dL (ref 1.5–2.5)

## 2013-09-01 MED ORDER — LORAZEPAM 2 MG/ML IJ SOLN
0.5000 mg | Freq: Once | INTRAMUSCULAR | Status: AC
  Administered 2013-09-01: 0.5 mg via INTRAVENOUS
  Filled 2013-09-01: qty 1

## 2013-09-01 MED ORDER — POTASSIUM CHLORIDE CRYS ER 20 MEQ PO TBCR
40.0000 meq | EXTENDED_RELEASE_TABLET | Freq: Once | ORAL | Status: AC
  Administered 2013-09-01: 40 meq via ORAL
  Filled 2013-09-01: qty 2

## 2013-09-01 MED ORDER — GADOBENATE DIMEGLUMINE 529 MG/ML IV SOLN
18.0000 mL | Freq: Once | INTRAVENOUS | Status: AC | PRN
  Administered 2013-09-01: 18 mL via INTRAVENOUS

## 2013-09-01 MED ORDER — AMLODIPINE BESYLATE 5 MG PO TABS: 5.0000 mg | ORAL_TABLET | Freq: Every day | ORAL | Status: DC

## 2013-09-01 MED ORDER — METRONIDAZOLE 500 MG PO TABS: 500.0000 mg | ORAL_TABLET | Freq: Three times a day (TID) | ORAL | Status: DC

## 2013-09-01 NOTE — Discharge Summary (Signed)
Physician Discharge Summary  Christina Hale POE:[id number] DOB: 1951-04-11 DOA: 08/29/2013  PCP: Alesia Richards, MD  Admit date: 08/29/2013 Discharge date: 09/01/2013  Time spent: >45 minutes  Recommendations for Outpatient Follow-up:  1. Needs Bmet and Mg+ level in 3-4 days  Discharge Diagnoses:  Principal Problem:   Seizures Active Problems:   Hyponatremia   Lung mass   Smoker   Enteritis due to Clostridium difficile   Liver mass   Discharge Condition: stable  Diet recommendation: low fiber, heart healthy  Filed Weights   08/30/13 0500 08/30/13 1831 08/31/13 1700  Weight: 90 kg (198 lb 6.6 oz) 86 kg (189 lb 9.5 oz) 83.507 kg (184 lb 1.6 oz)    History of present illness:  Christina Hale is a 63 y.o. female presenting on 08/29/2013 with has a past medical history of Hyperlipidemia; Hypertension; Prediabetes; smoker and Anxiety who presents with a seizure in setting of Vomiting/ profuse Diarrhea and Na+ of 113.  An incidental lung mass was found on a CXR- see below.   Hospital Course:  Principal Problem:  Seizures  - likley from hyponatremia but due to finding of large lung mass MRI performed  - MRI brain negative for brain mets   Active Problems:  Hyponatremia  - dehydration vs SIADH or both in setting of HCTZ use  - urine studies for SIADH at this time would be inaccurate as she has been receiving NS  - Na+ much improved now- stopped IVF - repeat Na+ still stable today- needs f/u of this in 3-4 days by PCP  Vomiting / diarrhea - stool + for c diff colitis  - has not had a significant diarrhea in the hospital - cont Flagyl x 14 days  Lung mass  - CT chest confirms large mass, smaller mass and a liver met - consulted pulm who noted the best course would be a bx of the liver met - have discussed in detail with IR Dr Kathlene Cote- will need to hold ASA (started by ICU on 3/15- not certain why) and she will have the bx done in a few days as outpt  Smoker  - advised  to quit   Anxiety  - cont Xanax   HTN - stop HCTZ in due to hyponatremia - start Norvasc instead  Procedures:  none  Consultations:  Pulm   IR  Discharge Exam: Filed Vitals:   09/01/13 1716  BP: 146/80  Pulse: 79  Temp: 98  Resp: 16    General: No acute respiratory distress  Lungs: Clear to auscultation bilaterally without wheezes or crackles  Cardiovascular: Regular rate and rhythm without murmur gallop or rub normal S1 and S2  Abdomen: Nontender, nondistended, soft, bowel sounds positive, no rebound, no ascites, no appreciable mass  Extremities: No significant cyanosis, clubbing, or edema bilateral lower extremities   Discharge Instructions  Discharge Orders   Future Appointments Provider Department Dept Phone   09/05/2013 11:15 AM Vicie Mutters, PA-C [facility] [phone]   11/21/2013 10:30 AM Ardis Hughs, PA-C [facility] [phone]   01/27/2014 3:00 PM Unk Pinto, MD  [facility] [phone]   Future Orders Complete By Expires   Discharge instructions  As directed    Comments:     Lung masses with liver met Low fiber for 1 wk Have K+ and Mg+ checked by Wednesday   Increase activity slowly  As directed    Increase activity slowly  As directed  Medication List    STOP taking these medications       hydrochlorothiazide 25 MG tablet  Commonly known as:  HYDRODIURIL      TAKE these medications       ALPRAZolam 1 MG tablet  Commonly known as:  XANAX  TAKE 1/2 TO 1 TABLET BY MOUTH DAILY AS NEEDED FOR ANXIETY     amLODipine 5 MG tablet  Commonly known as:  NORVASC  Take 1 tablet (5 mg total) by mouth daily.     atorvastatin 20 MG tablet  Commonly known as:  LIPITOR  TAKE 1 TABLET EVERY DAY     BABY ASPIRIN PO  Take 81 mg by mouth daily.     esomeprazole 40 MG capsule  Commonly known as:  NEXIUM  Take 40 mg by mouth daily  at 12 noon.     hyoscyamine 0.125 MG SL tablet  Commonly known as:  LEVSIN/SL  Place 1 tablet (0.125 mg total) under the tongue every 4 (four) hours as needed for cramping (nausea, diarrhea).     MAGNESIUM PO  Take 250 mg by mouth every other day.     metroNIDAZOLE 500 MG tablet  Commonly known as:  FLAGYL  Take 1 tablet (500 mg total) by mouth 3 (three) times daily.     MULTIVITAMIN PO  Take by mouth daily.     promethazine 12.5 MG tablet  Commonly known as:  PHENERGAN  Take 1 tablet (12.5 mg total) by mouth every 6 (six) hours as needed for nausea or vomiting.     VITAMIN D PO  Take 2,000 Int'l Units by mouth 2 (two) times daily.       Allergies  Allergen Reactions  . Codeine Nausea Only  . Iodine Nausea Only  . Prednisolone Nausea Only      The results of significant diagnostics from this hospitalization (including imaging, microbiology, ancillary and laboratory) are listed below for reference.    Significant Diagnostic Studies: Dg Abd 1 View  08/29/2013   CLINICAL DATA:  Nausea, vomiting, diarrhea.  EXAM: ABDOMEN - 1 VIEW  COMPARISON:  DG HIP COMPLETE*R* dated 02/01/2010  FINDINGS: There is an overall paucity of bowel gas. A small amount of gas is present in the central abdomen without evidence of bowel dilatation. No abnormal calcification is seen. No gross intraperitoneal free air is identified. 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  IMPRESSION: Nonspecific bowel gas pattern.   Electronically Signed   By: Logan Bores   On: 08/29/2013 22:40   Ct Head Wo Contrast  08/31/2013   CLINICAL DATA:  Seizure like activity earlier today.  EXAM: CT HEAD WITHOUT CONTRAST  TECHNIQUE: Contiguous axial images were obtained from the base of the skull through the vertex without intravenous contrast.  COMPARISON:  None.  FINDINGS: Mildly prominent subarachnoid spaces. Normal size and position of the ventricles. No intracranial hemorrhage, mass lesion or CT  evidence of acute infarction. Unremarkable bones and paranasal sinuses.  IMPRESSION: No acute abnormality.   Electronically Signed   By: Enrique Sack M.D.   On: 08/31/2013 17:32   Ct Chest Wo Contrast  08/31/2013   CLINICAL DATA:  Mass seen on chest x-ray.  EXAM: CT CHEST WITHOUT CONTRAST  TECHNIQUE: Multidetector CT imaging of the chest was performed following the standard protocol without IV contrast.  COMPARISON:  08/31/2013  FINDINGS: There is a large mass in the left upper lobe measuring approximately 9 x 5.3 cm. This is measured on  image 20 of series 3. Spiculated nodule in the left upper lobe corresponding to the density on chest x-ray measures 2.5 x 1.8 cm on image 20 of series 4. There are numerous other small predominantly subpleural nodules along the major fissure on the right. Trace right pleural effusion.  Enlarged prevascular lymph node on image 19 measures 13 mm in short axis diameter. No other enlarged mediastinal lymph nodes. No axillary adenopathy.  Heart is normal size. Scattered coronary artery calcifications. Chest wall soft tissues unremarkable.  There is a large somewhat irregular low-density mass in the left hepatic lobe measuring up to 5.1 cm. This concerning for metastasis.  IMPRESSION: Large a left upper lobe/ apical mass and spiculated right upper nodule/ mass. Both most compatible with primary lung cancer. Numerous subpleural nodules along the right major fissure. Trace right pleural effusion.  5.1 cm left hepatic mass concerning for metastasis.  These results were called by telephone at the time of interpretation on 08/31/2013 at 5:55 PM to Dr. Debbe Odea , who verbally acknowledged these results.   Electronically Signed   By: Rolm Baptise M.D.   On: 08/31/2013 19:04   Mr Jeri Cos YO Contrast  09/01/2013   CLINICAL DATA:  63 year old female with large lung mass diagnosis yesterday. Presented with seizures. Initial encounter.  EXAM: MRI HEAD WITHOUT AND WITH CONTRAST  TECHNIQUE:  Multiplanar, multiecho pulse sequences of the brain and surrounding structures were obtained without and with intravenous contrast.  CONTRAST:  19m MULTIHANCE GADOBENATE DIMEGLUMINE 529 MG/ML IV SOLN  COMPARISON:  Head CT without contrast 08/31/2013.  FINDINGS: Cerebral volume is within normal limits for age. No restricted diffusion to suggest acute infarction. No midline shift, mass effect, ventriculomegaly, extra-axial collection or acute intracranial hemorrhage. Cervicomedullary junction and pituitary are within normal limits. Major intracranial vascular flow voids are preserved, dominant distal left vertebral artery.  No enhancing brain mass. No pachymeningeal thickening or enhancement. No definite abnormal leptomeningeal enhancement (posterior fossa leptomeningeal enhancement felt to be vascular related and within normal limits for 3 Tesla imaging). Visualized bone marrow signal is within normal limits. Grossly normal visualized cervical spine.  GPearline Cablesand white matter signal is within normal limits for age throughout the brain.  Visualized orbit soft tissues are within normal limits. Minor paranasal sinus mucosal thickening. Mild left mastoid effusion. Negative nasopharynx except for trace retained secretions. Visible internal auditory structures appear normal. Visualized scalp soft tissues are within normal limits.  IMPRESSION: 1. No acute or metastatic intracranial abnormality. Negative for age MRI appearance of the brain. 2. Mild paranasal sinus inflammatory changes. Mild left mastoid effusion.   Electronically Signed   By: LLars PinksM.D.   On: 09/01/2013 17:00   Dg Chest Port 1 View  08/31/2013   CLINICAL DATA:  63year old female with nausea and vomiting. Query aspiration. Initial encounter.  EXAM: PORTABLE CHEST - 1 VIEW  COMPARISON:  Portable AP semi upright view at 0542 hrs.  FINDINGS: Abnormal masslike opacity throughout the left upper lung, about 10 cm in diameter.  Questionable spiculated opacity  right upper lung partially overlying the EKG lead in this area.  Lower mediastinal contours are within normal limits. Visualized tracheal air column is within normal limits. No upper thorax osseous destruction is evident.  There is also patchy year veiling opacity in the right costophrenic angle. No pulmonary edema.  IMPRESSION: 1. 10 cm mass like opacity in the left upper lung, most suspicious for bronchogenic carcinoma in the absence of comparison exams. Two view chest  x-ray may evaluate further if the patient is able, but ultimately chest CT (IV contrast preferred) likely will be necessary. 2. Right costophrenic angle pleural effusion or airspace opacity. 3. Possible spiculated pulmonary nodule right upper lobe. Salient findings discussed by telephone with RN Novella Rob on 08/31/2013 at 13:11 .   Electronically Signed   By: Lars Pinks M.D.   On: 08/31/2013 13:15    Microbiology: Recent Results (from the past 240 hour(s))  MRSA PCR SCREENING     Status: None   Collection Time    08/30/13  4:48 AM      Result Value Ref Range Status   MRSA by PCR NEGATIVE  NEGATIVE Final   Comment:            The GeneXpert MRSA Assay (FDA     approved for NASAL specimens     only), is one component of a     comprehensive MRSA colonization     surveillance program. It is not     intended to diagnose MRSA     infection nor to guide or     monitor treatment for     MRSA infections.  STOOL CULTURE     Status: None   Collection Time    08/31/13  7:15 AM      Result Value Ref Range Status   Specimen Description PERIRECTAL   Final   Special Requests Normal   Final   Culture     Final   Value: Culture reincubated for better growth     Performed at [facility]   Report Status PENDING   Incomplete  CLOSTRIDIUM DIFFICILE BY PCR     Status: Abnormal   Collection Time    08/31/13  7:16 AM      Result Value Ref Range Status   C difficile by pcr POSITIVE (*) NEGATIVE Final   Comment: CRITICAL RESULT  CALLED TO, READ BACK BY AND VERIFIED WITH:     OFORI E.,RN 08/31/13 1357 BY JONESJ     Labs: Basic Metabolic Panel:  Recent Labs Lab 08/26/13 1139  08/30/13 0628 08/30/13 0654  08/31/13 1505 08/31/13 1825 09/01/13 0015 09/01/13 0715 09/01/13 1100 09/01/13 1215  NA 135  < > 121*  --   < > 136* 136* 133* 135*  --  133*  K 3.9  < > 3.3*  --   < > 3.0* 3.1* 3.0* 3.5*  --  3.7  CL 96  < > 85*  --   < > 97 96 95* 98  --  94*  CO2 28  < > 23  --   < > 25 26 24 24   --  24  GLUCOSE 102*  < > 99  --   < > 107* 146* 121* 108*  --  102*  BUN 7  < > 4*  --   < > 4* 3* 3* <3*  --  3*  CREATININE 0.54  < > 0.46*  --   < > 0.50 0.50 0.51 0.48*  --  0.51  CALCIUM 9.7  < > 8.0*  --   < > 8.8 8.8 8.8 8.9  --  9.3  MG 1.8  --   --  1.5  --   --   --   --   --  1.9  --   PHOS  --   --   --  3.1  --   --   --   --   --   --   --   < > =  values in this interval not displayed. Liver Function Tests:  Recent Labs Lab 08/26/13 1139 08/28/13 1642 08/29/13 2000 08/31/13 0603  AST 17 23 26 27   ALT 15 15 14 14   ALKPHOS 116 117 122* 95  BILITOT 0.5 0.6 0.7 0.7  PROT 7.1 7.3 7.7 6.4  ALBUMIN 4.1 4.1 3.8 3.0*    Recent Labs Lab 08/28/13 1642  AMYLASE 23   No results found for this basename: AMMONIA,  in the last 168 hours CBC:  Recent Labs Lab 08/26/13 1139 08/28/13 1642 08/29/13 2000 08/30/13 0654 08/31/13 0603  WBC 8.4 10.8* 11.2* 9.6 7.5  NEUTROABS 6.2 9.0* 8.4*  --  5.5  HGB 15.2* 14.2 14.3 12.6 13.1  HCT 44.3 40.9 38.7 34.1* 36.8  MCV 90.0 87.6 85.8 84.6 88.5  PLT 263 266 223 186 175   Cardiac Enzymes: No results found for this basename: CKTOTAL, CKMB, CKMBINDEX, TROPONINI,  in the last 168 hours BNP: BNP (last 3 results) No results found for this basename: PROBNP,  in the last 8760 hours CBG:  Recent Labs Lab 09/01/13 [place]       SignedDebbe Odea, MD  [facility] 09/01/2013, 6:48 PM

## 2013-09-01 NOTE — Progress Notes (Signed)
Subjective: Asked to see pt again regarding abnormal chest ct.  Has spiculated mass in rul and large mass in LUL with prevascular LN.  Also large liver lesion that is most likely met.  Pt has hyponatremia and has had a seizure.  Objective: Vital signs in last 24 hours: Blood pressure 151/74, pulse 75, temperature 97.8 F (36.6 C), temperature source Oral, resp. rate 22, height 5' 10"  (1.778 m), weight 83.507 kg (184 lb 1.6 oz), SpO2 98.00%.  Intake/Output from previous day: 03/28 0701 - 03/29 0700 In: 1020 [P.O.:720; IV Piggyback:300] Out: 450 [Urine:450]   Physical Exam:      Lab Results:  Recent Labs  08/29/13 2000 08/30/13 0654 08/31/13 0603  WBC 11.2* 9.6 7.5  HGB 14.3 12.6 13.1  HCT 38.7 34.1* 36.8  PLT 223 186 175   BMET  Recent Labs  08/31/13 1825 09/01/13 0015 09/01/13 0715  NA 136* 133* 135*  K 3.1* 3.0* 3.5*  CL 96 95* 98  CO2 26 24 24   GLUCOSE 146* 121* 108*  BUN 3* 3* <3*  CREATININE 0.50 0.51 0.48*  CALCIUM 8.8 8.8 8.9    Studies/Results: Ct Head Wo Contrast  08/31/2013   CLINICAL DATA:  Seizure like activity earlier today.  EXAM: CT HEAD WITHOUT CONTRAST  TECHNIQUE: Contiguous axial images were obtained from the base of the skull through the vertex without intravenous contrast.  COMPARISON:  None.  FINDINGS: Mildly prominent subarachnoid spaces. Normal size and position of the ventricles. No intracranial hemorrhage, mass lesion or CT evidence of acute infarction. Unremarkable bones and paranasal sinuses.  IMPRESSION: No acute abnormality.   Electronically Signed   By: Enrique Sack M.D.   On: 08/31/2013 17:32   Ct Chest Wo Contrast  08/31/2013   CLINICAL DATA:  Mass seen on chest x-ray.  EXAM: CT CHEST WITHOUT CONTRAST  TECHNIQUE: Multidetector CT imaging of the chest was performed following the standard protocol without IV contrast.  COMPARISON:  08/31/2013  FINDINGS: There is a large mass in the left upper lobe measuring approximately 9 x 5.3 cm. This  is measured on image 20 of series 3. Spiculated nodule in the left upper lobe corresponding to the density on chest x-ray measures 2.5 x 1.8 cm on image 20 of series 4. There are numerous other small predominantly subpleural nodules along the major fissure on the right. Trace right pleural effusion.  Enlarged prevascular lymph node on image 19 measures 13 mm in short axis diameter. No other enlarged mediastinal lymph nodes. No axillary adenopathy.  Heart is normal size. Scattered coronary artery calcifications. Chest wall soft tissues unremarkable.  There is a large somewhat irregular low-density mass in the left hepatic lobe measuring up to 5.1 cm. This concerning for metastasis.  IMPRESSION: Large a left upper lobe/ apical mass and spiculated right upper nodule/ mass. Both most compatible with primary lung cancer. Numerous subpleural nodules along the right major fissure. Trace right pleural effusion.  5.1 cm left hepatic mass concerning for metastasis.  These results were called by telephone at the time of interpretation on 08/31/2013 at 5:55 PM to Dr. Debbe Odea , who verbally acknowledged these results.   Electronically Signed   By: Rolm Baptise M.D.   On: 08/31/2013 19:04   Dg Chest Port 1 View  08/31/2013   CLINICAL DATA:  63 year old female with nausea and vomiting. Query aspiration. Initial encounter.  EXAM: PORTABLE CHEST - 1 VIEW  COMPARISON:  Portable AP semi upright view at 0542 hrs.  FINDINGS:  Abnormal masslike opacity throughout the left upper lung, about 10 cm in diameter.  Questionable spiculated opacity right upper lung partially overlying the EKG lead in this area.  Lower mediastinal contours are within normal limits. Visualized tracheal air column is within normal limits. No upper thorax osseous destruction is evident.  There is also patchy year veiling opacity in the right costophrenic angle. No pulmonary edema.  IMPRESSION: 1. 10 cm mass like opacity in the left upper lung, most suspicious  for bronchogenic carcinoma in the absence of comparison exams. Two view chest x-ray may evaluate further if the patient is able, but ultimately chest CT (IV contrast preferred) likely will be necessary. 2. Right costophrenic angle pleural effusion or airspace opacity. 3. Possible spiculated pulmonary nodule right upper lobe. Salient findings discussed by telephone with RN Novella Rob on 08/31/2013 at 13:11 .   Electronically Signed   By: Lars Pinks M.D.   On: 08/31/2013 13:15    Assessment/Plan:  1) abnormal ct chest with multiple findings that are most c/w bronchogenic cancer.  This is most likely stage 4 disease.  Would recommend TTNA by radiology of liver lesion for both diagnostic and staging purposes.  Will also need PET as outpt for further w/u.  Agree with imaging of brain to exclude mets.  Will need MRI rather than ct.  Please call if issues obtaining tissue diagnosis.  If abnormal MRI will need med/xrt oncology evaluations.    Kathee Delton, M.D. 09/01/2013, 9:51 AM

## 2013-09-01 NOTE — Progress Notes (Signed)
Patient ID: Christina Hale, female   DOB: [birth year], 63 y.o.   MRN: [id number]  Case reviewed and discussed with Dr. Wynelle Cleveland.  Most appropriate lesion for biopsy is lesion in left lobe of liver.  Patient would like to go home today.  Will schedule outpatient ultrasound guided liver biopsy later this week.  Hold ASA until after biopsy.

## 2013-09-02 ENCOUNTER — Other Ambulatory Visit: Payer: Self-pay | Admitting: Internal Medicine

## 2013-09-02 ENCOUNTER — Telehealth: Payer: Self-pay

## 2013-09-02 DIAGNOSIS — K769 Liver disease, unspecified: Secondary | ICD-10-CM

## 2013-09-02 NOTE — Telephone Encounter (Signed)
Pt called requesting to speak w/ Estill Bamberg regarding appt last week and recent hospital visit. Please return pt's call [phone].

## 2013-09-03 ENCOUNTER — Other Ambulatory Visit: Payer: Self-pay | Admitting: Internal Medicine

## 2013-09-03 DIAGNOSIS — K769 Liver disease, unspecified: Secondary | ICD-10-CM

## 2013-09-04 LAB — STOOL CULTURE: Special Requests: NORMAL

## 2013-09-05 ENCOUNTER — Encounter: Payer: Self-pay | Admitting: Physician Assistant

## 2013-09-05 ENCOUNTER — Ambulatory Visit (INDEPENDENT_AMBULATORY_CARE_PROVIDER_SITE_OTHER): Payer: BC Managed Care – PPO | Admitting: Physician Assistant

## 2013-09-05 VITALS — BP 140/68 | HR 76 | Temp 98.1°F | Resp 16 | Ht 70.0 in | Wt 180.0 lb

## 2013-09-05 DIAGNOSIS — R911 Solitary pulmonary nodule: Secondary | ICD-10-CM

## 2013-09-05 DIAGNOSIS — A498 Other bacterial infections of unspecified site: Secondary | ICD-10-CM

## 2013-09-05 DIAGNOSIS — K769 Liver disease, unspecified: Secondary | ICD-10-CM

## 2013-09-05 DIAGNOSIS — K7689 Other specified diseases of liver: Secondary | ICD-10-CM

## 2013-09-05 DIAGNOSIS — J984 Other disorders of lung: Secondary | ICD-10-CM

## 2013-09-05 DIAGNOSIS — E871 Hypo-osmolality and hyponatremia: Secondary | ICD-10-CM

## 2013-09-05 DIAGNOSIS — B9689 Other specified bacterial agents as the cause of diseases classified elsewhere: Secondary | ICD-10-CM

## 2013-09-05 DIAGNOSIS — A499 Bacterial infection, unspecified: Secondary | ICD-10-CM

## 2013-09-05 DIAGNOSIS — N390 Urinary tract infection, site not specified: Secondary | ICD-10-CM

## 2013-09-05 LAB — BASIC METABOLIC PANEL WITH GFR
BUN: 6 mg/dL (ref 6–23)
CALCIUM: 9 mg/dL (ref 8.4–10.5)
CO2: 29 meq/L (ref 19–32)
Chloride: 101 mEq/L (ref 96–112)
Creat: 0.56 mg/dL (ref 0.50–1.10)
GFR, Est African American: >89 mL/min
GFR, Est Non African American: >89 mL/min
Glucose, Bld: 92 mg/dL (ref 70–99)
Potassium: 4.1 mEq/L (ref 3.5–5.3)
SODIUM: 135 meq/L (ref 135–145)

## 2013-09-05 LAB — CBC WITH DIFFERENTIAL/PLATELET
BASOS PCT: 1 % (ref 0–1)
Basophils Absolute: 0.1 10*3/uL (ref 0.0–0.1)
Eosinophils Absolute: 0.3 10*3/uL (ref 0.0–0.7)
Eosinophils Relative: 3 % (ref 0–5)
HCT: 41.1 % (ref 36.0–46.0)
Hemoglobin: 14.4 g/dL (ref 12.0–15.0)
LYMPHS ABS: 1.2 10*3/uL (ref 0.7–4.0)
Lymphocytes Relative: 14 % (ref 12–46)
MCH: 31.6 pg (ref 26.0–34.0)
MCHC: 35 g/dL (ref 30.0–36.0)
MCV: 90.1 fL (ref 78.0–100.0)
Monocytes Absolute: 0.5 10*3/uL (ref 0.1–1.0)
Monocytes Relative: 6 % (ref 3–12)
NEUTROS PCT: 76 % (ref 43–77)
Neutro Abs: 6.5 10*3/uL (ref 1.7–7.7)
PLATELETS: 245 10*3/uL (ref 150–400)
RBC: 4.56 MIL/uL (ref 3.87–5.11)
RDW: 13.7 % (ref 11.5–15.5)
WBC: 8.6 10*3/uL (ref 4.0–10.5)

## 2013-09-05 LAB — HEPATIC FUNCTION PANEL
ALT: 23 U/L (ref 0–35)
AST: 27 U/L (ref 0–37)
Albumin: 3.8 g/dL (ref 3.5–5.2)
Alkaline Phosphatase: 84 U/L (ref 39–117)
BILIRUBIN DIRECT: 0.1 mg/dL (ref 0.0–0.3)
Indirect Bilirubin: 0.3 mg/dL (ref 0.2–1.2)
Total Bilirubin: 0.4 mg/dL (ref 0.2–1.2)
Total Protein: 6.7 g/dL (ref 6.0–8.3)

## 2013-09-05 LAB — MAGNESIUM: MAGNESIUM: 1.8 mg/dL (ref 1.5–2.5)

## 2013-09-05 NOTE — Progress Notes (Signed)
HPI 63 y.o.female presents for follow up s/p hospital stay for multifactorial hyponatremia thought to be due to dehydration, HCTZ and SIADH, unfortunately she was found to have a lung mass with mets to her liver and Cdiff which she is still on flaygl. She is scheduled for a liver biopsy next week. Her husband is here and sees Dr. Benay Spice for cancer and is currently getting treatments and she would like a referral to him. She is now off the HCTZ and on norvasc that she takes at night which is controlled her BP, no edema. She states that her stool is still soft but more formed, no stomach pain.   Lab Results  Component Value Date   CREATININE 0.51 09/01/2013   BUN 3* 09/01/2013   NA 133* 09/01/2013   K 3.7 09/01/2013   CL 94* 09/01/2013   CO2 24 09/01/2013    Past Medical History  Diagnosis Date  . Hyperlipidemia   . Hypertension   . Prediabetes   . Vitamin D deficiency   . Anxiety      Allergies  Allergen Reactions  . Codeine Nausea Only  . Iodine Nausea Only  . Prednisolone Nausea Only      Current Outpatient Prescriptions on File Prior to Visit  Medication Sig Dispense Refill  . ALPRAZolam (XANAX) 1 MG tablet TAKE 1/2 TO 1 TABLET BY MOUTH DAILY AS NEEDED FOR ANXIETY  90 tablet  1  . amLODipine (NORVASC) 5 MG tablet Take 1 tablet (5 mg total) by mouth daily.  30 tablet  0  . atorvastatin (LIPITOR) 20 MG tablet TAKE 1 TABLET EVERY DAY  30 tablet  2  . Cholecalciferol (VITAMIN D PO) Take 2,000 Int'l Units by mouth 2 (two) times daily.       Marland Kitchen MAGNESIUM PO Take 250 mg by mouth every other day.      . metroNIDAZOLE (FLAGYL) 500 MG tablet Take 1 tablet (500 mg total) by mouth 3 (three) times daily.  36 tablet  0  . Multiple Vitamins-Minerals (MULTIVITAMIN PO) Take by mouth daily.       No current facility-administered medications on file prior to visit.    ROS: all negative expect above.   Physical: Filed Weights   09/05/13 1110  Weight: 180 lb (81.647 kg)   BP 140/68  Pulse  76  Temp(Src) 98.1 F (36.7 C)  Resp 16  Ht 5\' 10"  (1.778 m)  Wt 180 lb (81.647 kg)  BMI 25.83 kg/m2 General Appearance: Well nourished, in no apparent distress. Eyes: PERRLA, EOMs. Sinuses: No Frontal/maxillary tenderness ENT/Mouth: Ext aud canals clear, normal light reflex with TMs without erythema, bulging. Post pharynx without erythema, swelling, exudate.  Respiratory: CTAB Cardio: RRR, no murmurs, rubs or gallops. Peripheral pulses brisk and equal bilaterally, without edema. No aortic or femoral bruits. Abdomen: Soft, with bowl sounds. Nontender, no guarding, rebound. Lymphatics: Non tender without lymphadenopathy.  Musculoskeletal: Full ROM all peripheral extremities, 5/5 strength, and normal gait. Skin: Warm, dry without rashes, lesions, ecchymosis.  Neuro: Cranial nerves intact, reflexes equal bilaterally. Normal muscle tone, no cerebellar symptoms. Sensation intact.  Pysch: Awake and oriented X 3, normal affect, Insight and Judgment appropriate.   Assessment and Plan: Hyponatremia- off HCTZ, will recheck today with Mag.  Lung mass with mets to liver- she is off ASA until after her liver biopsy, we will send in a referral Dr. Benay Spice per patient request Tobacco abuse- smoking cessation- she has quit smoking since her diagnosis Cdiff- finish flaygl we can  recheck in 2-4 weeks, call if ANY loose stools UTI- recheck in one month

## 2013-09-05 NOTE — Patient Instructions (Signed)
Hyponatremia   Hyponatremia is when the amount of salt (sodium) in your blood is too low. When sodium levels are low, your cells will absorb extra water and swell. The swelling happens throughout the body, but it mostly affects the brain. Severe brain swelling (cerebral edema), seizures, or coma can happen.   CAUSES    Heart, kidney, or liver problems.   Thyroid problems.   Adrenal gland problems.   Severe vomiting and diarrhea.   Certain medicines or illegal drugs.   Dehydration.   Drinking too much water.   Low-sodium diet.  SYMPTOMS    Nausea and vomiting.   Confusion.   Lethargy.   Agitation.   Headache.   Twitching or shaking (seizures).   Unconsciousness.   Appetite loss.   Muscle weakness and cramping.  DIAGNOSIS   Hyponatremia is identified by a simple blood test. Your caregiver will perform a history and physical exam to try to find the cause and type of hyponatremia. Other tests may be needed to measure the amount of sodium in your blood and urine.  TREATMENT   Treatment will depend on the cause.    Fluids may be given through the vein (IV).   Medicines may be used to correct the sodium imbalance. If medicines are causing the problem, they will need to be adjusted.   Water or fluid intake may be restricted to restore proper balance.  The speed of correcting the sodium problem is very important. If the problem is corrected too fast, nerve damage (sometimes unchangeable) can happen.  HOME CARE INSTRUCTIONS    Only take medicines as directed by your caregiver. Many medicines can make hyponatremia worse. Discuss all your medicines with your caregiver.   Carefully follow any recommended diet, including any fluid restrictions.   You may be asked to repeat lab tests. Follow these directions.   Avoid alcohol and recreational drugs.  SEEK MEDICAL CARE IF:    You develop worsening nausea, fatigue, headache, confusion, or weakness.   Your original hyponatremia symptoms return.   You have  problems following the recommended diet.  SEEK IMMEDIATE MEDICAL CARE IF:    You have a seizure.   You faint.   You have ongoing diarrhea or vomiting.  MAKE SURE YOU:    Understand these instructions.   Will watch your condition.   Will get help right away if you are not doing well or get worse.  Document Released: 05/13/2002 Document Revised: 08/15/2011 Document Reviewed: 11/07/2010  ExitCare Patient Information 2014 [employer].

## 2013-09-09 ENCOUNTER — Other Ambulatory Visit: Payer: Self-pay | Admitting: *Deleted

## 2013-09-09 ENCOUNTER — Telehealth: Payer: Self-pay | Admitting: Oncology

## 2013-09-09 ENCOUNTER — Other Ambulatory Visit: Payer: Self-pay | Admitting: Radiology

## 2013-09-09 NOTE — Telephone Encounter (Signed)
Called pt and left message regarding MD visit on 4/15

## 2013-09-09 NOTE — Telephone Encounter (Signed)
Called pt home number and cell phone left message regarding MD visit on 4/15th

## 2013-09-10 ENCOUNTER — Other Ambulatory Visit: Payer: Self-pay | Admitting: Internal Medicine

## 2013-09-10 ENCOUNTER — Ambulatory Visit (HOSPITAL_COMMUNITY)
Admission: RE | Admit: 2013-09-10 | Discharge: 2013-09-10 | Disposition: A | Discharge status: Home / Self Care | Payer: BC Managed Care – PPO | Source: Ambulatory Visit | Attending: General Surgery | Admitting: General Surgery

## 2013-09-10 ENCOUNTER — Other Ambulatory Visit (INDEPENDENT_AMBULATORY_CARE_PROVIDER_SITE_OTHER): Payer: Self-pay | Admitting: General Surgery

## 2013-09-10 DIAGNOSIS — J95811 Postprocedural pneumothorax: Secondary | ICD-10-CM

## 2013-09-10 DIAGNOSIS — Z4889 Encounter for other specified surgical aftercare: Secondary | ICD-10-CM | POA: Insufficient documentation

## 2013-09-13 ENCOUNTER — Ambulatory Visit (HOSPITAL_COMMUNITY)
Admission: RE | Admit: 2013-09-13 | Discharge: 2013-09-13 | Disposition: A | Discharge status: Home / Self Care | Payer: BC Managed Care – PPO | Source: Ambulatory Visit | Attending: Internal Medicine | Admitting: Internal Medicine

## 2013-09-13 ENCOUNTER — Encounter (HOSPITAL_COMMUNITY): Payer: Self-pay

## 2013-09-13 DIAGNOSIS — K769 Liver disease, unspecified: Secondary | ICD-10-CM

## 2013-09-13 DIAGNOSIS — K7689 Other specified diseases of liver: Secondary | ICD-10-CM | POA: Insufficient documentation

## 2013-09-13 LAB — CBC
HCT: 44.4 % (ref 36.0–46.0)
Hemoglobin: 15.2 g/dL — ABNORMAL HIGH (ref 12.0–15.0)
MCH: 31.7 pg (ref 26.0–34.0)
MCHC: 34.2 g/dL (ref 30.0–36.0)
MCV: 92.7 fL (ref 78.0–100.0)
PLATELETS: 245 10*3/uL (ref 150–400)
RBC: 4.79 MIL/uL (ref 3.87–5.11)
RDW: 13.7 % (ref 11.5–15.5)
WBC: 8.7 10*3/uL (ref 4.0–10.5)

## 2013-09-13 LAB — PROTIME-INR
INR: 1.09 (ref 0.00–1.49)
Prothrombin Time: 13.9 seconds (ref 11.6–15.2)

## 2013-09-13 LAB — APTT: aPTT: 29 seconds (ref 24–37)

## 2013-09-13 MED ORDER — GELATIN ABSORBABLE 12-7 MM EX MISC
CUTANEOUS | Status: AC
  Filled 2013-09-13: qty 1

## 2013-09-13 MED ORDER — MIDAZOLAM HCL 2 MG/2ML IJ SOLN
INTRAMUSCULAR | Status: AC
  Filled 2013-09-13: qty 4

## 2013-09-13 MED ORDER — FENTANYL CITRATE 0.05 MG/ML IJ SOLN
INTRAMUSCULAR | Status: DC | PRN
  Administered 2013-09-13 (×2): 50 ug via INTRAVENOUS

## 2013-09-13 MED ORDER — FENTANYL CITRATE 0.05 MG/ML IJ SOLN
INTRAMUSCULAR | Status: AC
  Filled 2013-09-13: qty 4

## 2013-09-13 MED ORDER — SODIUM CHLORIDE 0.9 % IV SOLN: INTRAVENOUS | Status: DC

## 2013-09-13 MED ORDER — MIDAZOLAM HCL 2 MG/2ML IJ SOLN
INTRAMUSCULAR | Status: DC | PRN
  Administered 2013-09-13 (×2): 1 mg via INTRAVENOUS

## 2013-09-13 NOTE — Procedures (Signed)
Technically successful US guided biopsy of mass within the left lobe of the liver.  No immediate complications.

## 2013-09-13 NOTE — Discharge Instructions (Signed)
Liver Biopsy  Care After  These instructions give you information on caring for yourself after your procedure. Your doctor may also give you more specific instructions. Call your doctor if you have any problems or questions after your procedure.  HOME CARE  · Watch for bleeding at your biopsy site.  · No heavy lifting, pushing, or pulling for 48 hours (2 days).  · No exercise, jogging, or sex for 48 hours (2 days).  · Do not drive or use heavy machinery for 24 hours (1 day).  · Go back to your usual diet and medicines as told by your doctor.  · Do not take the bandage off until the next morning.  · Only take medicine as told by your doctor.  · Do not shower or bathe until the next day.  GET HELP RIGHT AWAY IF:  · You have shortness of breath or trouble breathing.  · You have pain or cramping in your belly (abdomen).  · You feel sick to your stomach (nauseous) or throw up (vomit).  · Bleeding does not stop from the place where the needle was put in. Press on the place that is bleeding until you are checked in the Emergency Room.  · Yellowish white fluid (pus) is coming from the place where the needle was put in.  · You have any unusual pain that will not stop.  · You have puffiness (swelling) or redness at the place where the needle was put in, or if the place is very sore or hot when you touch it.  · You have a fever of more than 102° F (38.9° C) for 2 or more days.  · You have black, smelly poops (bowel movements).  If you go to the Emergency Room, tell the nurse that you had a liver biopsy. Take this paper with you and show it to the nurse. Keep your follow-up appointment.  MAKE SURE YOU:  · Understand these instructions.  · Will watch your condition.  · Will get help right away if you are not doing well or get worse.  Document Released: 03/01/2008 Document Revised: 08/15/2011 Document Reviewed: 03/01/2008  ExitCare® Patient Information ©2014 [employer].

## 2013-09-13 NOTE — H&P (Signed)
Christina Hale is an 62 y.o. female.   Chief Complaint: pt suffered new Sz 2 weeks ago and hospitalized for same Etiology dehydration per pt In patient work up included CXR and CT chest Large left lobe lung mass; left lobe liver lesion Previous liver US in 01/2013 described left lobe cyst Scheduled now for liver mass biopsy Pt has quit smoking x 2 weeks  HPI: HLD; HTN  Past Medical History  Diagnosis Date  . Hyperlipidemia   . Hypertension   . Prediabetes   . Vitamin D deficiency   . Anxiety     Past Surgical History  Procedure Laterality Date  . Lasik Bilateral 2002  . Carpal tunnel release Right 2000  . Abdominal hysterectomy  1999  . Cesarean section      x2    Family History  Problem Relation Age of Onset  . Heart disease Mother   . Hypertension Mother   . Heart attack Mother   . Pneumonia Father    Social History:  reports that she quit smoking about 2 weeks ago. Her smoking use included Cigarettes. She has a 40 pack-year smoking history. She does not have any smokeless tobacco history on file. She reports that she does not drink alcohol or use illicit drugs.  Allergies:  Allergies  Allergen Reactions  . Codeine Nausea Only  . Iodine Nausea Only  . Prednisolone Nausea Only     (Not in a hospital admission)  Results for orders placed during the hospital encounter of 09/13/13 (from the past 48 hour(s))  CBC     Status: Abnormal   Collection Time    09/13/13  1:12 PM      Result Value Ref Range   WBC 8.7  4.0 - 10.5 K/uL   RBC 4.79  3.87 - 5.11 MIL/uL   Hemoglobin 15.2 (*) 12.0 - 15.0 g/dL   HCT 44.4  36.0 - 46.0 %   MCV 92.7  78.0 - 100.0 fL   MCH 31.7  26.0 - 34.0 pg   MCHC 34.2  30.0 - 36.0 g/dL   RDW 13.7  11.5 - 15.5 %   Platelets 245  150 - 400 K/uL   No results found.  Review of Systems  Constitutional: Negative for fever and weight loss.  Respiratory: Positive for cough.   Cardiovascular: Negative for chest pain.  Gastrointestinal:  Negative for nausea and vomiting.  Neurological: Negative for weakness and headaches.  Psychiatric/Behavioral: Positive for substance abuse.       Quit smoking x 2 weeks    Blood pressure 135/56, pulse 90, temperature 98.4 F (36.9 C), temperature source Oral, resp. rate 18, height 5\' 10"  (1.778 m), weight 81.194 kg (179 lb), SpO2 97.00%. Physical Exam  Constitutional: She is oriented to person, place, and time. She appears well-nourished.  Cardiovascular: Normal rate, regular rhythm and normal heart sounds.   No murmur heard. Respiratory: Effort normal. She has wheezes.  GI: Soft. Bowel sounds are normal. There is no tenderness.  Musculoskeletal: Normal range of motion.  Neurological: She is alert and oriented to person, place, and time.  Skin: Skin is warm and dry.  Psychiatric: She has a normal mood and affect. Her behavior is normal. Judgment and thought content normal.     Assessment/Plan Lung and liver mass No primary cancer hx Scheduled for liver mass biopsy Pt aware of procedure benefits and risks and agreeable to proceed Consent signed and in chart  Lavonia Drafts 09/13/2013, 1:52 PM

## 2013-09-18 ENCOUNTER — Ambulatory Visit (HOSPITAL_BASED_OUTPATIENT_CLINIC_OR_DEPARTMENT_OTHER): Payer: BC Managed Care – PPO | Admitting: Oncology

## 2013-09-18 ENCOUNTER — Encounter: Payer: Self-pay | Admitting: Oncology

## 2013-09-18 ENCOUNTER — Telehealth: Payer: Self-pay | Admitting: Oncology

## 2013-09-18 ENCOUNTER — Ambulatory Visit (HOSPITAL_BASED_OUTPATIENT_CLINIC_OR_DEPARTMENT_OTHER): Payer: BC Managed Care – PPO

## 2013-09-18 ENCOUNTER — Encounter: Payer: Self-pay | Admitting: *Deleted

## 2013-09-18 VITALS — BP 141/78 | HR 95 | Temp 97.8°F | Resp 18 | Ht 70.0 in | Wt 177.5 lb

## 2013-09-18 DIAGNOSIS — C787 Secondary malignant neoplasm of liver and intrahepatic bile duct: Secondary | ICD-10-CM

## 2013-09-18 DIAGNOSIS — C3410 Malignant neoplasm of upper lobe, unspecified bronchus or lung: Secondary | ICD-10-CM

## 2013-09-18 DIAGNOSIS — C3490 Malignant neoplasm of unspecified part of unspecified bronchus or lung: Secondary | ICD-10-CM

## 2013-09-18 NOTE — Progress Notes (Signed)
Called pathology per Dr. Benay Spice request.  Asked to send EGFR/ALK.  Pathology tech verbalized understanding of request.

## 2013-09-18 NOTE — Progress Notes (Signed)
[facility] New Patient Consult   Referring MD: Unk Pinto, Md [address] [place], [place] [address]   Cecillia Menees 63 y.o.  Dec 19, 1950    Reason for Referral: Non-small cell lung cancer   HPI: She 08/29/2013 with nausea/vomiting and diarrhea. She was noted to have hyponatremia and had a seizure in the emergency room. She was admitted to the ICU. An MRI of the brain was negative. The stool returned positive for C. difficile. She completed a course of Flagyl. The diarrhea has resolved.  A CT of the chest 08/31/2013 confirmed a large left upper lobe mass and a spiculated nodule in the left upper lung. Numerous small subpleural nodules were noted along the major fissure on the right. There was an enlarged prevascular lymph node. No other enlarged mediastinal nodes. An irregular mass was seen in the left lobe of the liver measuring 5.1 cm.  She underwent an ultrasound-guided biopsy of a liver mass on 09/13/2013 and the pathology ([id number]) confirmed metastatic adenocarcinoma. The tumor cells returned positive for cytokeratin 7, TTF-1, and Napsin-A consistent with a diagnosis of metastatic lung cancer. The tumor is negative for CDX-2, cytokeratin 5/6, cytokeratin 20, estrogen receptor, gross cystic disease fluid protein. Week focal positivity was seen with cytokeratin 903 and p63.  She reports feeling well at present.      Past Medical History  Diagnosis Date  . Hyperlipidemia   . Hypertension   . Prediabetes   . Vitamin D deficiency   . Anxiety       C. difficile colitis                                                                                      March 2015     G2 P2     Solar keratoses   Past Surgical History  Procedure Laterality Date  . Lasik Bilateral 2002  . Carpal tunnel release Right 2000  . Abdominal hysterectomy  1999  . Cesarean section      x2    Medications: Reviewed  Allergies:  Allergies  Allergen Reactions   . Codeine Nausea Only  . Iodine Nausea Only  . Prednisolone Nausea Only    Family history: Her maternal grandmother had head and neck cancer, a maternal aunt had breast cancer in her 21s. No other family history of cancer.  Social History:   She lives with her husband and [place]. 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 She has smoked since she was in her 45s and quit approximately 2 weeks ago. Social alcohol use. She received a transfusion with a DNC procedure in 1988. No risk factor for hepatitis or HIV.     ROS:   Positives include: 14 pound weight loss, 1-2 loose stools per day, nonproductive cough for the past few months  A complete ROS was otherwise negative.  Physical Exam:  Blood pressure 141/78, pulse 95, temperature 97.8 F (36.6 C), temperature source Oral, resp. rate 18, height 5' 10" (1.778 m), weight 177 lb 8 oz (80.513 kg).  HEENT: Upper denture plate, oropharynx without visible mass, neck without mass Lungs: Clear bilaterally Cardiac: Regular rate  and rhythm Abdomen: No hepatomegaly, nontender, no mass Breasts: Bilateral breast without mass  Vascular: No leg edema Lymph nodes: No cervical, supra-clavicular, axillary, or inguinal nodes Neurologic: Alert and oriented, the motor exam appears intact in the upper and lower extremity Skin: No rash, abrasions at the site sof solar keratoses treatment over the upper chest and arms Musculoskeletal: No spine tenderness   LAB:  CBC  Lab Results  Component Value Date   WBC 8.7 09/13/2013   HGB 15.2* 09/13/2013   HCT 44.4 09/13/2013   MCV 92.7 09/13/2013   PLT 245 09/13/2013   NEUTROABS 6.5 09/05/2013     CMP      Component Value Date/Time   NA 135 09/05/2013 1201   K 4.1 09/05/2013 1201   CL 101 09/05/2013 1201   CO2 29 09/05/2013 1201   GLUCOSE 92 09/05/2013 1201   BUN 6 09/05/2013 1201   CREATININE 0.56 09/05/2013 1201   CREATININE 0.51 09/01/2013 1215   CALCIUM 9.0 09/05/2013 1201   PROT 6.7  09/05/2013 1201   ALBUMIN 3.8 09/05/2013 1201   AST 27 09/05/2013 1201   ALT 23 09/05/2013 1201   ALKPHOS 84 09/05/2013 1201   BILITOT 0.4 09/05/2013 1201   GFRNONAA >89 09/05/2013 1201   GFRNONAA >90 09/01/2013 1215   GFRAA >89 09/05/2013 1201   GFRAA >90 09/01/2013 1215      Imaging:  As per history of present illness   Assessment/Plan:   1. Stage IV adenocarcinoma of the lung, dominant left upper lobe mass and spiculated right upper lobe mass  Status post a biopsy of a left hepatic mass confirming metastatic adenocarcinoma consistent with a lung primary 2. C. difficile colitis March 2015 3. Hyponatremia/seizure on hospital admission 08/29/2013   Disposition:   Ms. Guymon has been diagnosed with metastatic non-small cell lung cancer. I discussed the diagnosis and treatment options with Ms. Elzey and her husband. No therapy will be curative. She appears to be a candidate for systemic chemotherapy. The tumor will be submitted for ALK and EGFR testing prior to beginning systemic chemotherapy.  We discussed the CTSU [phone] study in which patients are treated with 4 cycles of Taxol/carboplatin/Avastin followed by a randomization to "maintenance" Alimta, Avastin, or Alimta/Avastin. She met with the research first today and will consider enrollment on the clinical trial.  Ms. Dufford will be referred for a chemotherapy teaching class and placement of a Port-A-Cath. She will return for an office visit and additional discussion on 09/27/2013.  Ladell Pier 09/18/2013, 1:18 PM

## 2013-09-18 NOTE — Progress Notes (Signed)
Checked in new patient with no financial issues.  °

## 2013-09-18 NOTE — Telephone Encounter (Signed)
gv and printed apt sched and avs for pt for April.Christina KitchenMarland Hale

## 2013-09-24 ENCOUNTER — Encounter (HOSPITAL_COMMUNITY): Payer: Self-pay | Admitting: Pharmacy Technician

## 2013-09-24 ENCOUNTER — Other Ambulatory Visit: Payer: Self-pay | Admitting: *Deleted

## 2013-09-24 ENCOUNTER — Telehealth: Payer: Self-pay | Admitting: *Deleted

## 2013-09-24 ENCOUNTER — Telehealth: Payer: Self-pay | Admitting: Oncology

## 2013-09-24 DIAGNOSIS — C3490 Malignant neoplasm of unspecified part of unspecified bronchus or lung: Secondary | ICD-10-CM

## 2013-09-24 NOTE — Telephone Encounter (Signed)
Message from radiology scheduling: pt has allergy to contrast dye. Asking if scan can be done without contrast. Reviewed with Jenny Reichmann, RN research. She will follow up to see if CT may be done without contrast for study.

## 2013-09-24 NOTE — Telephone Encounter (Signed)
Per cindy and 4/21 pof the pt is aware of the appts and to pick up the contrast.

## 2013-09-25 ENCOUNTER — Encounter (HOSPITAL_COMMUNITY): Payer: Self-pay

## 2013-09-25 ENCOUNTER — Telehealth: Payer: Self-pay | Admitting: *Deleted

## 2013-09-25 DIAGNOSIS — C3490 Malignant neoplasm of unspecified part of unspecified bronchus or lung: Secondary | ICD-10-CM

## 2013-09-25 NOTE — Telephone Encounter (Signed)
Received fax from [facility]: Liver needle core biopsy tissue [id number]. ALK gene rearrangement- NOT DETECTED. Results on Dr. Gearldine Shown desk for review.

## 2013-09-26 ENCOUNTER — Other Ambulatory Visit: Payer: BC Managed Care – PPO

## 2013-09-26 ENCOUNTER — Encounter: Payer: BC Managed Care – PPO | Admitting: *Deleted

## 2013-09-26 ENCOUNTER — Other Ambulatory Visit: Payer: Self-pay | Admitting: Radiology

## 2013-09-26 NOTE — Telephone Encounter (Signed)
Late entry for 09/25/13: Per Dr. Benay Spice: Order will be changed to non-contrast. Jenny Reichmann, RN (research) made aware.

## 2013-09-27 ENCOUNTER — Encounter: Payer: Self-pay | Admitting: Oncology

## 2013-09-27 ENCOUNTER — Encounter: Payer: Self-pay | Admitting: *Deleted

## 2013-09-27 ENCOUNTER — Ambulatory Visit (HOSPITAL_BASED_OUTPATIENT_CLINIC_OR_DEPARTMENT_OTHER): Payer: BC Managed Care – PPO | Admitting: Oncology

## 2013-09-27 ENCOUNTER — Telehealth: Payer: Self-pay | Admitting: Oncology

## 2013-09-27 ENCOUNTER — Ambulatory Visit (HOSPITAL_COMMUNITY)
Admission: RE | Admit: 2013-09-27 | Discharge: 2013-09-27 | Disposition: A | Discharge status: Home / Self Care | Payer: BC Managed Care – PPO | Source: Ambulatory Visit | Attending: Oncology | Admitting: Oncology

## 2013-09-27 ENCOUNTER — Other Ambulatory Visit (HOSPITAL_BASED_OUTPATIENT_CLINIC_OR_DEPARTMENT_OTHER): Payer: BC Managed Care – PPO

## 2013-09-27 VITALS — BP 134/69 | HR 91 | Temp 97.1°F | Resp 20 | Ht 70.0 in | Wt 179.0 lb

## 2013-09-27 DIAGNOSIS — C3490 Malignant neoplasm of unspecified part of unspecified bronchus or lung: Secondary | ICD-10-CM

## 2013-09-27 DIAGNOSIS — C787 Secondary malignant neoplasm of liver and intrahepatic bile duct: Secondary | ICD-10-CM

## 2013-09-27 DIAGNOSIS — C3410 Malignant neoplasm of upper lobe, unspecified bronchus or lung: Secondary | ICD-10-CM

## 2013-09-27 LAB — COMPREHENSIVE METABOLIC PANEL (CC13)
ALBUMIN: 3.6 g/dL (ref 3.5–5.0)
ALK PHOS: 115 U/L (ref 40–150)
ALT: 11 U/L (ref 0–55)
AST: 16 U/L (ref 5–34)
Anion Gap: 11 mEq/L (ref 3–11)
BUN: 8.2 mg/dL (ref 7.0–26.0)
CALCIUM: 10.1 mg/dL (ref 8.4–10.4)
CO2: 26 mEq/L (ref 22–29)
Chloride: 102 mEq/L (ref 98–109)
Creatinine: 0.7 mg/dL (ref 0.6–1.1)
GLUCOSE: 96 mg/dL (ref 70–140)
Potassium: 4.1 mEq/L (ref 3.5–5.1)
Sodium: 138 mEq/L (ref 136–145)
Total Bilirubin: 0.38 mg/dL (ref 0.20–1.20)
Total Protein: 8.4 g/dL — ABNORMAL HIGH (ref 6.4–8.3)

## 2013-09-27 LAB — CBC WITH DIFFERENTIAL/PLATELET
BASO%: 0.5 % (ref 0.0–2.0)
BASOS ABS: 0 10*3/uL (ref 0.0–0.1)
EOS ABS: 0.4 10*3/uL (ref 0.0–0.5)
EOS%: 4.6 % (ref 0.0–7.0)
HCT: 44.2 % (ref 34.8–46.6)
HGB: 14.8 g/dL (ref 11.6–15.9)
LYMPH%: 15.3 % (ref 14.0–49.7)
MCH: 30.3 pg (ref 25.1–34.0)
MCHC: 33.4 g/dL (ref 31.5–36.0)
MCV: 90.8 fL (ref 79.5–101.0)
MONO#: 0.5 10*3/uL (ref 0.1–0.9)
MONO%: 6.4 % (ref 0.0–14.0)
NEUT#: 6.2 10*3/uL (ref 1.5–6.5)
NEUT%: 73.2 % (ref 38.4–76.8)
Platelets: 245 10*3/uL (ref 145–400)
RBC: 4.87 10*6/uL (ref 3.70–5.45)
RDW: 13.3 % (ref 11.2–14.5)
WBC: 8.5 10*3/uL (ref 3.9–10.3)
lymph#: 1.3 10*3/uL (ref 0.9–3.3)

## 2013-09-27 LAB — LACTATE DEHYDROGENASE (CC13): LDH: 194 U/L (ref 125–245)

## 2013-09-27 LAB — PROTHROMBIN TIME
INR: 0.95 (ref <1.50)
PROTHROMBIN TIME: 12.5 s (ref 11.6–15.2)

## 2013-09-27 LAB — UA PROTEIN, DIPSTICK - CHCC: PROTEIN: NEGATIVE mg/dL

## 2013-09-27 LAB — APTT: APTT: 30.1 s (ref 24–37)

## 2013-09-27 LAB — MAGNESIUM (CC13): MAGNESIUM: 1.9 mg/dL (ref 1.5–2.5)

## 2013-09-27 NOTE — Telephone Encounter (Signed)
gv and printed appt shced and avs for pt for April adn May

## 2013-09-27 NOTE — Progress Notes (Signed)
[facility] Psychosocial Distress Screening Clinical Social Work  Clinical Social Work was referred by distress screening protocol.  The patient scored a 7 on the Psychosocial Distress Thermometer which indicates moderate distress. Clinical Social Worker phoned pt at home to assess for distress and other psychosocial needs. CSW had to leave message for pt to return call. CSW will also refer to Chaplain as indicated by screen.   ONCBCN DISTRESS SCREENING 09/27/2013  Mark the number that describes how much distress you have been experiencing in the past week 7  Emotional problem type Nervousness/Anxiety;Adjusting to illness  Spiritual/Religous concerns type Facing my mortality  Physical Problem type Pain;Loss of appetitie  Physician notified of physical symptoms Yes  Referral to clinical social work Yes   Clinical Social Worker follow up needed: yes  If yes, follow up plan: CSW will continue to try to reach out to pt.  Refer to [place], LCSW Clinical Social Worker Josephina Shih. [facility] for [facility] Wednesday, Thursday and Friday Phone: [phone] Fax: [phone]

## 2013-09-27 NOTE — Progress Notes (Signed)
  [facility] OFFICE PROGRESS NOTE   Diagnosis: Non-small cell lung cancer  INTERVAL HISTORY:   Christina Hale returns as scheduled. She feels well. No significant cough or dyspnea. She is scheduled for Port-A-Cath placement 09/30/2013. She has attended a chemotherapy teaching class.  The diarrhea has resolved. Good appetite. She reports mild discomfort at the right "hip ". She relates this to chronic bursitis. This has been present for years and she gets relief with steroid injections. She last received a steroid injection approximately 1 year ago.  Objective:  Vital signs in last 24 hours:  Blood pressure 134/69, pulse 91, temperature 97.1 F (36.2 C), temperature source Oral, resp. rate 20, height $RemoveBe'5\' 10"'ajMwKbrKO$  (1.778 m), weight 179 lb (81.194 kg).    HEENT: Neck without mass Lymphatics: No cervical or supraclavicular nodes Resp: Lungs clear bilaterally Cardio: Regular rate and rhythm GI: No hepatosplenomegaly, nontender Vascular: No leg Musculoskeletal: No tenderness at the right trochanter. No pain with motion at the right hip.    Lab Results:  Lab Results  Component Value Date   WBC 8.5 09/27/2013   HGB 14.8 09/27/2013   HCT 44.2 09/27/2013   MCV 90.8 09/27/2013   PLT 245 09/27/2013   NEUTROABS 6.2 09/27/2013     Medications: I have reviewed the patient's current medications.  Assessment/Plan: 1. Stage IV adenocarcinoma of the lung, dominant left upper lobe mass and spiculated right upper lobe mass Status post a biopsy of a left hepatic mass confirming metastatic adenocarcinoma consistent with a lung primary, ALK mutation negative 2. C. difficile colitis March 2015 3. Hyponatremia/seizure on hospital admission 08/29/2013 4. Chronic right hip "bursitis "   Disposition:  Christina Hale appear stable. She has an ECoG performance status of 0. The plan is to proceed with treatment on the CTSU E5508 study with Taxol/carboplatin/Avastin. She has attended a chemotherapy  teaching class. The potential toxicities associated with these agents have been reviewed. She agrees to proceed. She has read the protocol consent form.  She will undergo staging CT scans today. A first cycle of chemotherapy is scheduled for 10/07/2013. We will see her for an office visit 10/07/2013.  The EGFR mutation testing is pending.  Ladell Pier, MD  09/27/2013  12:35 PM

## 2013-09-27 NOTE — Telephone Encounter (Signed)
emailed MW to add tx

## 2013-09-27 NOTE — Progress Notes (Signed)
No date for treatment has been entered as of today, even though notes say 10/07/13--dr visit same day.

## 2013-09-29 ENCOUNTER — Other Ambulatory Visit: Payer: Self-pay | Admitting: Internal Medicine

## 2013-09-30 ENCOUNTER — Encounter (HOSPITAL_COMMUNITY): Payer: Self-pay

## 2013-09-30 ENCOUNTER — Telehealth: Payer: Self-pay | Admitting: *Deleted

## 2013-09-30 ENCOUNTER — Ambulatory Visit (HOSPITAL_COMMUNITY)
Admission: RE | Admit: 2013-09-30 | Discharge: 2013-09-30 | Disposition: A | Discharge status: Home / Self Care | Payer: BC Managed Care – PPO | Source: Ambulatory Visit | Attending: Oncology | Admitting: Oncology

## 2013-09-30 ENCOUNTER — Other Ambulatory Visit: Payer: Self-pay | Admitting: Oncology

## 2013-09-30 ENCOUNTER — Telehealth: Payer: Self-pay | Admitting: Oncology

## 2013-09-30 DIAGNOSIS — C3490 Malignant neoplasm of unspecified part of unspecified bronchus or lung: Secondary | ICD-10-CM

## 2013-09-30 LAB — CBC
HCT: 42.1 % (ref 36.0–46.0)
Hemoglobin: 14.3 g/dL (ref 12.0–15.0)
MCH: 30.4 pg (ref 26.0–34.0)
MCHC: 34 g/dL (ref 30.0–36.0)
MCV: 89.6 fL (ref 78.0–100.0)
PLATELETS: 229 10*3/uL (ref 150–400)
RBC: 4.7 MIL/uL (ref 3.87–5.11)
RDW: 13.1 % (ref 11.5–15.5)
WBC: 8.6 10*3/uL (ref 4.0–10.5)

## 2013-09-30 LAB — BASIC METABOLIC PANEL
BUN: 9 mg/dL (ref 6–23)
CALCIUM: 10.3 mg/dL (ref 8.4–10.5)
CO2: 28 mEq/L (ref 19–32)
CREATININE: 0.61 mg/dL (ref 0.50–1.10)
Chloride: 99 mEq/L (ref 96–112)
GFR calc Af Amer: >90 mL/min (ref >90)
GFR calc non Af Amer: >90 mL/min (ref >90)
GLUCOSE: 92 mg/dL (ref 70–99)
Potassium: 3.8 mEq/L (ref 3.7–5.3)
SODIUM: 139 meq/L (ref 137–147)

## 2013-09-30 LAB — PROTIME-INR
INR: 0.95 (ref 0.00–1.49)
PROTHROMBIN TIME: 12.5 s (ref 11.6–15.2)

## 2013-09-30 LAB — APTT: aPTT: 32 seconds (ref 24–37)

## 2013-09-30 MED ORDER — MIDAZOLAM HCL 2 MG/2ML IJ SOLN
INTRAMUSCULAR | Status: AC | PRN
  Administered 2013-09-30 (×4): 1 mg via INTRAVENOUS

## 2013-09-30 MED ORDER — CEFAZOLIN SODIUM-DEXTROSE 2-3 GM-% IV SOLR
2.0000 g | Freq: Once | INTRAVENOUS | Status: AC
  Administered 2013-09-30: 2 g via INTRAVENOUS
  Filled 2013-09-30: qty 50

## 2013-09-30 MED ORDER — MIDAZOLAM HCL 2 MG/2ML IJ SOLN
INTRAMUSCULAR | Status: AC
  Filled 2013-09-30: qty 6

## 2013-09-30 MED ORDER — HEPARIN SOD (PORK) LOCK FLUSH 100 UNIT/ML IV SOLN
INTRAVENOUS | Status: AC
  Filled 2013-09-30: qty 5

## 2013-09-30 MED ORDER — HEPARIN SOD (PORK) LOCK FLUSH 100 UNIT/ML IV SOLN
INTRAVENOUS | Status: AC | PRN
  Administered 2013-09-30: 500 [IU]

## 2013-09-30 MED ORDER — LIDOCAINE HCL 1 % IJ SOLN
INTRAMUSCULAR | Status: AC
  Filled 2013-09-30: qty 20

## 2013-09-30 MED ORDER — FENTANYL CITRATE 0.05 MG/ML IJ SOLN
INTRAMUSCULAR | Status: AC | PRN
  Administered 2013-09-30: 25 ug via INTRAVENOUS
  Administered 2013-09-30: 50 ug via INTRAVENOUS
  Administered 2013-09-30: 25 ug via INTRAVENOUS

## 2013-09-30 MED ORDER — FENTANYL CITRATE 0.05 MG/ML IJ SOLN
INTRAMUSCULAR | Status: AC
  Filled 2013-09-30: qty 6

## 2013-09-30 MED ORDER — SODIUM CHLORIDE 0.9 % IV SOLN
Freq: Once | INTRAVENOUS | Status: AC
  Administered 2013-09-30: 12:00:00 via INTRAVENOUS

## 2013-09-30 NOTE — Procedures (Signed)
RIJV PAC tip SVC RA No comp

## 2013-09-30 NOTE — Discharge Instructions (Signed)
Implanted Port Home Guide °An implanted port is a type of central line that is placed under the skin. Central lines are used to provide IV access when treatment or nutrition needs to be given through a person's veins. Implanted ports are used for long-term IV access. An implanted port may be placed because:  °· You need IV medicine that would be irritating to the small veins in your hands or arms.   °· You need long-term IV medicines, such as antibiotics.   °· You need IV nutrition for a long period.   °· You need frequent blood draws for lab tests.   °· You need dialysis.   °Implanted ports are usually placed in the chest area, but they can also be placed in the upper arm, the abdomen, or the leg. An implanted port has two main parts:  °· Reservoir. The reservoir is round and will appear as a small, raised area under your skin. The reservoir is the part where a needle is inserted to give medicines or draw blood.   °· Catheter. The catheter is a thin, flexible tube that extends from the reservoir. The catheter is placed into a large vein. Medicine that is inserted into the reservoir goes into the catheter and then into the vein.   °HOW WILL I CARE FOR MY INCISION SITE? °Do not get the incision site wet. Bathe or shower as directed by your health care provider.  °HOW IS MY PORT ACCESSED? °Special steps must be taken to access the port:  °· Before the port is accessed, a numbing cream can be placed on the skin. This helps numb the skin over the port site.   °· Your health care provider uses a sterile technique to access the port. °· Your health care provider must put on a mask and sterile gloves. °· The skin over your port is cleaned carefully with an antiseptic and allowed to dry. °· The port is gently pinched between sterile gloves, and a needle is inserted into the port. °· Only "non-coring" port needles should be used to access the port. Once the port is accessed, a blood return should be checked. This helps  ensure that the port is in the vein and is not clogged.   °· If your port needs to remain accessed for a constant infusion, a clear (transparent) bandage will be placed over the needle site. The bandage and needle will need to be changed every week, or as directed by your health care provider.   °· Keep the bandage covering the needle clean and dry. Do not get it wet. Follow your health care provider's instructions on how to take a shower or bath while the port is accessed.   °· If your port does not need to stay accessed, no bandage is needed over the port.   °WHAT IS FLUSHING? °Flushing helps keep the port from getting clogged. Follow your health care provider's instructions on how and when to flush the port. Ports are usually flushed with saline solution or a medicine called heparin. The need for flushing will depend on how the port is used.  °· If the port is used for intermittent medicines or blood draws, the port will need to be flushed:   °· After medicines have been given.   °· After blood has been drawn.   °· As part of routine maintenance.   °· If a constant infusion is running, the port may not need to be flushed.   °HOW LONG WILL MY PORT STAY IMPLANTED? °The port can stay in for as long as your health care   provider thinks it is needed. When it is time for the port to come out, surgery will be done to remove it. The procedure is similar to the one performed when the port was put in.  WHEN SHOULD I SEEK IMMEDIATE MEDICAL CARE? When you have an implanted port, you should seek immediate medical care if:   You notice a bad smell coming from the incision site.   You have swelling, redness, or drainage at the incision site.   You have more swelling or pain at the port site or the surrounding area.   You have a fever that is not controlled with medicine. Document Released: 05/23/2005 Document Revised: 03/13/2013 Document Reviewed: 01/28/2013 [facility] Patient Information 2014 [place]. Moderate Sedation, Adult Moderate sedation is given to help you relax or even sleep through a procedure. You may remain sleepy, be clumsy, or have poor balance for several hours following this procedure. Arrange for a responsible adult, family member, or friend to take you home. A responsible adult should stay with you for at least 24 hours or until the medicines have worn off.  Do not participate in any activities where you could become injured for the next 24 hours, or until you feel normal again. Do not:  Drive.  Swim.  Ride a bicycle.  Operate heavy machinery.  Cook.  Use power tools.  Climb ladders.  Work at [employer].  Do not make important decisions or sign legal documents until you are improved.  Vomiting may occur if you eat too soon. When you can drink without vomiting, try water, juice, or soup. Try solid foods if you feel little or no nausea.  Only take over-the-counter or prescription medications for pain, discomfort, or fever as directed by your caregiver.If pain medications have been prescribed for you, ask your caregiver how soon it is safe to take them.  Make sure you and your family fully understands everything about the medication given to you. Make sure you understand what side effects may occur.  You should not drink alcohol, take sleeping pills, or medications that cause drowsiness for at least 24 hours.  If you smoke, do not smoke alone.  If you are feeling better, you may resume normal activities 24 hours after receiving sedation.  Keep all appointments as scheduled. Follow all instructions.  Ask questions if you do not understand. SEEK MEDICAL CARE IF:   Your skin is pale or bluish in color.  You continue to feel sick to your stomach (nauseous) or throw up (vomit).  Your pain is getting worse and not helped by medication.  You have bleeding or swelling.  You are still sleepy or feeling clumsy after 24 hours. SEEK IMMEDIATE MEDICAL CARE IF:    You develop a rash.  You have difficulty breathing.  You develop any type of allergic problem.  You have a fever. Document Released: 02/15/2001 Document Revised: 08/15/2011 Document Reviewed: 01/28/2013 [facility] Patient Information 2014 [place]. Implanted Port Insertion, Care After Refer to this sheet in the next few weeks. These instructions provide you with information on caring for yourself after your procedure. Your health care provider may also give you more specific instructions. Your treatment has been planned according to current medical practices, but problems sometimes occur. Call your health care provider if you have any problems or questions after your procedure. WHAT TO EXPECT AFTER THE PROCEDURE After your procedure, it is typical to have the following:   Discomfort at the port insertion site. Ice packs to the  area will help.  Bruising on the skin over the port. This will subside in 3 4 days. HOME CARE INSTRUCTIONS  After your port is placed, you will get a manufacturer's information card. The card has information about your port. Keep this card with you at all times.   Know what kind of port you have. There are many types of ports available.   Wear a medical alert bracelet in case of an emergency. This can help alert health care workers that you have a port.   The port can stay in for as long as your health care provider believes it is necessary.   A home health care nurse may give medicines and take care of the port.   You or a family member can get special training and directions for giving medicine and taking care of the port at home.  SEEK MEDICAL CARE IF:  Your port does not flush or you are unable to get a blood return.   SEEK IMMEDIATE MEDICAL CARE IF:  You have new fluid or pus coming from your incision.   You notice a bad smell coming from your incision site.   You have swelling, pain, or more redness at the incision or port site.    You have a fever or chills.   You have chest pain or shortness of breath. Document Released: 03/13/2013 Document Reviewed: 01/28/2013 [facility] Patient Information 2014 [place], [place].

## 2013-09-30 NOTE — H&P (Signed)
Chief Complaint: "I'm here for a portacath" Referring Physician:Sherrill HPI: Christina Hale is an 63 y.o. female with metastatic lung cancer. She underwent liver lesion biopsy a few weeks ago which confirmed diagnosis. She did well from that procedure. She is to begin chemotherapy soon and is here today for portacath. PMHx and meds reviewed.  Past Medical History:  Past Medical History  Diagnosis Date  . Hyperlipidemia   . Hypertension   . Prediabetes   . Vitamin D deficiency   . Anxiety     Past Surgical History:  Past Surgical History  Procedure Laterality Date  . Lasik Bilateral 2002  . Carpal tunnel release Right 2000  . Abdominal hysterectomy  1999  . Cesarean section      x2    Family History:  Family History  Problem Relation Age of Onset  . Heart disease Mother   . Hypertension Mother   . Heart attack Mother   . Pneumonia Father     Social History:  reports that she quit smoking about 4 weeks ago. Her smoking use included Cigarettes. She has a 40 pack-year smoking history. She does not have any smokeless tobacco history on file. She reports that she does not drink alcohol or use illicit drugs.  Allergies:  Allergies  Allergen Reactions  . Codeine Nausea Only  . Iodine Nausea Only  . Prednisolone Nausea Only    Medications:   Medication List    ASK your doctor about these medications       ALPRAZolam 1 MG tablet  Commonly known as:  XANAX  Take 0.25-0.5 mg by mouth 4 (four) times daily as needed for anxiety.     amLODipine 5 MG tablet  Commonly known as:  NORVASC  Take 5 mg by mouth every morning.     atorvastatin 20 MG tablet  Commonly known as:  LIPITOR  Take 20 mg by mouth every other day.     GLUCOS-CHONDROIT-MSM COMPLEX PO  Take 30 mLs by mouth daily.     loratadine 10 MG tablet  Commonly known as:  CLARITIN  Take 10 mg by mouth daily.     multivitamin with minerals Tabs tablet  Take 1 tablet by mouth daily.     nicotine 7 mg/24hr  patch  Commonly known as:  NICODERM CQ - dosed in mg/24 hr  Place 7 mg onto the skin daily.     promethazine 12.5 MG tablet  Commonly known as:  PHENERGAN  Take 12.5 mg by mouth as needed.     Vitamin D 2000 UNITS Caps  Take 4,000 Units by mouth daily.        Please HPI for pertinent positives, otherwise complete 10 system ROS negative.  Physical Exam: T: 97.7, BP: 128/72, HR: 101, RR: 18   General Appearance:  Alert, cooperative, no distress, appears stated age  Head:  Normocephalic, without obvious abnormality, atraumatic  ENT: Unremarkable  Neck: Supple, symmetrical, trachea midline  Lungs:   Clear to auscultation bilaterally, no w/r/r, respirations unlabored without use of accessory muscles.  Chest Wall:  No tenderness or deformity  Heart:  Regular rate and rhythm, S1, S2 normal, no murmur, rub or gallop.  Neurologic: Normal affect, no gross deficits.   No results found for this or any previous visit (from the past 48 hour(s)). No results found.  Assessment/Plan Metastatic lung cancer For Port placement Discussed procedure, risks, complications, use of sedation Labs pending Consent signed in chart  Ascencion Dike PA-C 09/30/2013, 12:26 PM

## 2013-09-30 NOTE — Telephone Encounter (Signed)
Per staff message and POF I have scheduled appts.  JMW  

## 2013-09-30 NOTE — Telephone Encounter (Signed)
lvm for pt regarding to may appt...mailed pt appt sched/avs and letter

## 2013-10-02 ENCOUNTER — Other Ambulatory Visit: Payer: Self-pay | Admitting: *Deleted

## 2013-10-02 ENCOUNTER — Other Ambulatory Visit: Payer: Self-pay | Admitting: Internal Medicine

## 2013-10-02 DIAGNOSIS — C3490 Malignant neoplasm of unspecified part of unspecified bronchus or lung: Secondary | ICD-10-CM

## 2013-10-02 NOTE — Telephone Encounter (Signed)
Call from pt requesting prescription for wig to be faxed to [facility] [facility] so insurance will cover it. Same done.

## 2013-10-03 ENCOUNTER — Telehealth: Payer: Self-pay | Admitting: *Deleted

## 2013-10-03 ENCOUNTER — Other Ambulatory Visit: Payer: Self-pay | Admitting: Internal Medicine

## 2013-10-03 MED ORDER — DEXAMETHASONE 4 MG PO TABS: ORAL_TABLET | ORAL | Status: DC

## 2013-10-03 NOTE — Telephone Encounter (Signed)
Left message requesting pt to call office. Rx sent to pharmacy for steroid premed for first Taxol. Reviewed with Jenny Reichmann, Research RN: OK for oral premed on protocol.

## 2013-10-03 NOTE — Telephone Encounter (Signed)
Pt returned call, steroid premedication instructions given. She voiced understanding.

## 2013-10-04 ENCOUNTER — Telehealth: Payer: Self-pay | Admitting: Dietician

## 2013-10-04 NOTE — Telephone Encounter (Signed)
Brief Outpatient Oncology Nutrition Note  Patient has been identified to be at risk on malnutrition screen.  Wt Readings from Last 10 Encounters:  09/27/13 179 lb (81.194 kg)  09/18/13 177 lb 8 oz (80.513 kg)  09/13/13 179 lb (81.194 kg)  09/05/13 180 lb (81.647 kg)  08/31/13 184 lb 1.6 oz (83.507 kg)  08/28/13 189 lb (85.73 kg)  08/26/13 186 lb (84.369 kg)      Dx:  Lung Cancer  Patient of Dr. Benay Spice  Called patient due to weight loss.  Patient states that her appetite and intake have returned to normal.  Discussed importance of weight maintenance which patient verbalized.  No needs at this time.  Patient to call for any nutrition related questions or needs.  Antonieta Iba, RD, LDN

## 2013-10-04 NOTE — Progress Notes (Signed)
See Consent Form encounter for details.

## 2013-10-06 ENCOUNTER — Other Ambulatory Visit: Payer: Self-pay | Admitting: Oncology

## 2013-10-07 ENCOUNTER — Telehealth: Payer: Self-pay | Admitting: Oncology

## 2013-10-07 ENCOUNTER — Ambulatory Visit: Payer: Self-pay | Admitting: Physician Assistant

## 2013-10-07 ENCOUNTER — Ambulatory Visit (HOSPITAL_BASED_OUTPATIENT_CLINIC_OR_DEPARTMENT_OTHER): Payer: BC Managed Care – PPO | Admitting: Oncology

## 2013-10-07 ENCOUNTER — Encounter: Payer: BC Managed Care – PPO | Admitting: *Deleted

## 2013-10-07 ENCOUNTER — Other Ambulatory Visit: Payer: BC Managed Care – PPO

## 2013-10-07 ENCOUNTER — Ambulatory Visit (HOSPITAL_BASED_OUTPATIENT_CLINIC_OR_DEPARTMENT_OTHER): Payer: BC Managed Care – PPO

## 2013-10-07 VITALS — BP 145/70 | HR 104 | Temp 98.1°F | Resp 18 | Ht 70.0 in | Wt 179.6 lb

## 2013-10-07 VITALS — BP 130/79 | HR 90 | Temp 97.9°F | Resp 20

## 2013-10-07 DIAGNOSIS — C787 Secondary malignant neoplasm of liver and intrahepatic bile duct: Secondary | ICD-10-CM

## 2013-10-07 DIAGNOSIS — C3410 Malignant neoplasm of upper lobe, unspecified bronchus or lung: Secondary | ICD-10-CM

## 2013-10-07 DIAGNOSIS — C3490 Malignant neoplasm of unspecified part of unspecified bronchus or lung: Secondary | ICD-10-CM

## 2013-10-07 DIAGNOSIS — Z5112 Encounter for antineoplastic immunotherapy: Secondary | ICD-10-CM

## 2013-10-07 DIAGNOSIS — Z5111 Encounter for antineoplastic chemotherapy: Secondary | ICD-10-CM

## 2013-10-07 DIAGNOSIS — M7150 Other bursitis, not elsewhere classified, unspecified site: Secondary | ICD-10-CM

## 2013-10-07 LAB — RESEARCH LABS

## 2013-10-07 MED ORDER — DEXAMETHASONE SODIUM PHOSPHATE 20 MG/5ML IJ SOLN
20.0000 mg | Freq: Once | INTRAMUSCULAR | Status: AC
  Administered 2013-10-07: 20 mg via INTRAVENOUS

## 2013-10-07 MED ORDER — FAMOTIDINE IN NACL 20-0.9 MG/50ML-% IV SOLN
INTRAVENOUS | Status: AC
  Filled 2013-10-07: qty 50

## 2013-10-07 MED ORDER — DIPHENHYDRAMINE HCL 50 MG/ML IJ SOLN
50.0000 mg | Freq: Once | INTRAMUSCULAR | Status: AC
  Administered 2013-10-07: 50 mg via INTRAVENOUS

## 2013-10-07 MED ORDER — ONDANSETRON 16 MG/50ML IVPB (CHCC)
INTRAVENOUS | Status: AC
  Filled 2013-10-07: qty 16

## 2013-10-07 MED ORDER — SODIUM CHLORIDE 0.9 % IV SOLN
Freq: Once | INTRAVENOUS | Status: AC
  Administered 2013-10-07: 17:00:00 via INTRAVENOUS

## 2013-10-07 MED ORDER — SODIUM CHLORIDE 0.9 % IV SOLN
790.0000 mg | Freq: Once | INTRAVENOUS | Status: AC
  Administered 2013-10-07: 790 mg via INTRAVENOUS
  Filled 2013-10-07: qty 79

## 2013-10-07 MED ORDER — SODIUM CHLORIDE 0.9 % IJ SOLN
10.0000 mL | INTRAMUSCULAR | Status: DC | PRN
  Administered 2013-10-07: 10 mL
  Filled 2013-10-07: qty 10

## 2013-10-07 MED ORDER — DEXAMETHASONE SODIUM PHOSPHATE 20 MG/5ML IJ SOLN
INTRAMUSCULAR | Status: AC
Start: 2013-10-07 — End: 2013-10-07
  Filled 2013-10-07: qty 5

## 2013-10-07 MED ORDER — BEVACIZUMAB CHEMO INJECTION 400 MG/16ML
15.0000 mg/kg | Freq: Once | INTRAVENOUS | Status: AC
  Administered 2013-10-07: 1225 mg via INTRAVENOUS
  Filled 2013-10-07: qty 49

## 2013-10-07 MED ORDER — SODIUM CHLORIDE 0.9 % IV SOLN
Freq: Once | INTRAVENOUS | Status: AC
  Administered 2013-10-07: 11:00:00 via INTRAVENOUS

## 2013-10-07 MED ORDER — ONDANSETRON 16 MG/50ML IVPB (CHCC)
16.0000 mg | Freq: Once | INTRAVENOUS | Status: AC
  Administered 2013-10-07: 16 mg via INTRAVENOUS

## 2013-10-07 MED ORDER — HEPARIN SOD (PORK) LOCK FLUSH 100 UNIT/ML IV SOLN
500.0000 [IU] | Freq: Once | INTRAVENOUS | Status: AC | PRN
  Administered 2013-10-07: 500 [IU]
  Filled 2013-10-07: qty 5

## 2013-10-07 MED ORDER — PACLITAXEL CHEMO INJECTION 300 MG/50ML
200.0000 mg/m2 | Freq: Once | INTRAVENOUS | Status: AC
  Administered 2013-10-07: 402 mg via INTRAVENOUS
  Filled 2013-10-07: qty 67

## 2013-10-07 MED ORDER — FAMOTIDINE IN NACL 20-0.9 MG/50ML-% IV SOLN
20.0000 mg | Freq: Once | INTRAVENOUS | Status: AC
  Administered 2013-10-07: 20 mg via INTRAVENOUS

## 2013-10-07 MED ORDER — DIPHENHYDRAMINE HCL 50 MG/ML IJ SOLN
INTRAMUSCULAR | Status: AC
  Filled 2013-10-07: qty 1

## 2013-10-07 NOTE — Telephone Encounter (Signed)
Pt came by looking for Biopsy appt, notified Lauren regarding referral for stress test

## 2013-10-07 NOTE — Progress Notes (Signed)
  [facility] OFFICE PROGRESS NOTE   Diagnosis: Stage IV non-small cell lung cancer  INTERVAL HISTORY:   Christina Hale returns as scheduled. She has a mild cough. No dyspnea. She is scheduled to begin treatment on the CTSU E5508 studies today.  She underwent placement of a Port-A-Cath for 27 2015  Objective:  Vital signs in last 24 hours:  Blood pressure 145/70, pulse 104, temperature 98.1 F (36.7 C), temperature source Oral, resp. rate 18, height _0  (1.778 m), weight 179 lb 9.6 oz (81.466 kg).   Resp: Lungs clear bilaterally Cardio: Regular rate and rhythm GI: No hepatomegaly, nontender Vascular: No leg edema    Portacath/PICC-without erythema  Lab Results:  Lab Results  Component Value Date   WBC 8.6 09/30/2013   HGB 14.3 09/30/2013   HCT 42.1 09/30/2013   MCV 89.6 09/30/2013   PLT 229 09/30/2013   NEUTROABS 6.2 09/27/2013      Imaging: CTs of the chest, abdomen, and pelvis on 09/27/2013-anterior left upper lobe mass compatible with a primary bronchogenic neoplasm was suspected lymphangitic spread in the left upper lung, adjacent superior mediastinal adenopathy, 2.6 cm right upper lung nodule, left hepatic mass, 1.5 cm left external iliac node, 1.4 cm left adrenal nodule-favored to be benign   Medications: I have reviewed the patient's current medications.  Assessment/Plan: 1. Stage IV adenocarcinoma of the lung, dominant left upper lobe mass and spiculated right upper lobe mass Status post a biopsy of a left hepatic mass confirming metastatic adenocarcinoma consistent with a lung primary, ALK mutation negative, EGFR amplification suboptimal-repeat testing recommend Staging CT scans 24 2015 confirmed a dominant left upper lung mass, a smaller right upper lung mass, left hepatic mass, and left external iliac node 2. C. difficile colitis March 2015 3. Hyponatremia/seizure on hospital admission 08/29/2013 4. Chronic right hip "bursitis  "   Disposition:  Christina Hale appears stable. The plan is to begin treatment with Taxol/carboplatin and Avastin on study today. She has an ECoG performance status of 1.  Christina Hale has attended a chemotherapy teaching class. She will return for a nadir CBC. She knows to contact us for a fever. She is scheduled for an office visit and cycle 2 chemotherapy 10/29/2013.  The liver needle core biopsy tissue had suboptimal PCR amplification on the EGFR mutation analysis and a repeat biopsy is recommended. I discussed this with Christina Hale. There is a low likelihood of an EGFR mutation in her case. We will consider a repeat biopsy for EGFR testing in the future.  Ladell Pier, MD  10/07/2013  5:23 PM

## 2013-10-07 NOTE — Progress Notes (Signed)
10/07/2013 Patient in to clinic today to begin Induction treatment cycle 1. Upon arrival, research blood samples were collected via venipuncture in the lab. As patient's clinical condition is unchanged since screening, per Dr. Benay Spice, lab results obtained for registration are acceptable and do not need to be repeated today. Based on history and physical exam by Dr. Benay Spice today, patient condition is acceptable for treatment initiation.  Sign for infusion was given to [place].  Cindy S. Brigitte Pulse BSN, RN, [place] 10/07/2013 11:06 AM

## 2013-10-07 NOTE — Telephone Encounter (Signed)
gve the pt's husband the may 2015 appt calendar. Sent michelle a staff message to add the chemo appt on 10/29/2013.

## 2013-10-07 NOTE — Patient Instructions (Addendum)
[facility] Discharge Instructions for Patients Receiving Chemotherapy  Today you received the following chemotherapy agents Taxol, Carboplatin, Avastin  To help prevent nausea and vomiting after your treatment, we encourage you to take your nausea medication as needed.   If you develop nausea and vomiting that is not controlled by your nausea medication, call the clinic.   BELOW ARE SYMPTOMS THAT SHOULD BE REPORTED IMMEDIATELY:  *FEVER GREATER THAN 100.5 F  *CHILLS WITH OR WITHOUT FEVER  NAUSEA AND VOMITING THAT IS NOT CONTROLLED WITH YOUR NAUSEA MEDICATION  *UNUSUAL SHORTNESS OF BREATH  *UNUSUAL BRUISING OR BLEEDING  TENDERNESS IN MOUTH AND THROAT WITH OR WITHOUT PRESENCE OF ULCERS  *URINARY PROBLEMS  *BOWEL PROBLEMS  UNUSUAL RASH Items with * indicate a potential emergency and should be followed up as soon as possible.  Feel free to call the clinic should you have any questions or concerns. The clinic phone number is [phone].

## 2013-10-08 ENCOUNTER — Telehealth: Payer: Self-pay | Admitting: *Deleted

## 2013-10-08 ENCOUNTER — Encounter: Payer: Self-pay | Admitting: Specialist

## 2013-10-08 ENCOUNTER — Telehealth: Payer: Self-pay

## 2013-10-08 NOTE — Telephone Encounter (Signed)
Spoke with Christina Hale and she stes that she is doing well. Denies n/v, moth sores.  She is a little constipated today.  She took a Senokot S last night.  Suggested that she take 2 tabs this evening and in am.  Each time with 8 oz of water.  If no good evacuation by midday tomorrow suggested she use a capful of Miralax.  Christina Hale verbalized understanding. She knows to call [phone] if she has any questions or concerns.

## 2013-10-08 NOTE — Progress Notes (Signed)
Spoke with patient on the phone. She and her husband are currently cancer patients. Provided information about chaplaincy services. Offered emotional and spiritual support. Explored spiritual and emotional resources for coping. Patient and her spouse are people of strong faith. They recently began going to a new church, [employer]. They have good support there, but they are still new and, therefore, appreciate the extra support.  Will continue to follow.  Epifania Gore, [facility], PhD Chaplain

## 2013-10-08 NOTE — Telephone Encounter (Signed)
Received telephone call from patient today regarding additional appointments not appearing on schedule. Patient states she is missing appointments for labwork on 10/17/2013 (patient prefers appointment around 10 or 11, if possible) and for chemotherapy on 10/29/13. Told patient that I would route her request to the schedulers for follow up. Cindy S. Brigitte Pulse BSN, RN, [place] 10/08/2013 9:13 AM

## 2013-10-08 NOTE — Telephone Encounter (Signed)
I have called and gave the patient her lab appt for 5/14. Told her that we are working on the treatment for 5/26

## 2013-10-08 NOTE — Telephone Encounter (Signed)
Message copied by Baruch Merl on Tue Oct 08, 2013  2:58 PM ------      Message from: Sharlynn Oliphant A      Created: Mon Oct 07, 2013 11:07 AM      Regarding: chemo follow-up call      Contact: [phone]       [phone]  Taxol carbo avastin ------

## 2013-10-08 NOTE — Telephone Encounter (Signed)
Per POF and staff message I have tried to schedule appt for 5/26. No available. [place]

## 2013-10-09 ENCOUNTER — Telehealth: Payer: Self-pay | Admitting: Oncology

## 2013-10-09 NOTE — Telephone Encounter (Signed)
S/w the pt and she is aware of her lab and md appt on 10/25/2013 and her chemo being on 10/29/2013 as scheduled.

## 2013-10-10 ENCOUNTER — Ambulatory Visit: Payer: Self-pay | Admitting: Physician Assistant

## 2013-10-10 ENCOUNTER — Telehealth: Payer: Self-pay | Admitting: *Deleted

## 2013-10-10 ENCOUNTER — Other Ambulatory Visit: Payer: Self-pay | Admitting: *Deleted

## 2013-10-10 NOTE — Telephone Encounter (Signed)
   Provider input needed: Muscle and joint pain/aching   Reason for call: Pain  Musculoskeletal:positive for arthralgias and myalgias   ALLERGIES:  is allergic to codeine; iodine; and prednisolone.  Patient last received chemotherapy/ treatment on 10/07/13-taxol/carboplatin/avastin---1st tx  Patient was last seen in the office on 10/07/13  Next appt is 10/25/13  Is patient having fevers greater than 100.5?   No   Is patient having uncontrolled pain, or new pain? yes, generalized joint & muscle  Is patient having new back pain that changes with position (worsens or eases when laying down?)  no   Is patient able to eat and drink? yes    Is patient able to pass stool without difficulty?   yes     Is patient having uncontrolled nausea?  no    patient calls 10/10/2013 with complaint of  Musculoskeletal:positive for arthralgias   Summary Based on the above information advised patient to  Take Ibuprofen 200-400 mg tid prn pain. Call office tomorrow if this does not help and Dr. Benay Spice will call in some Tramadol for her. Patient able to repeat instructions to nurse and will take with food. Instructions given to nurse by Dr. Benay Spice.  Tania Ade  10/10/2013, 11:18 AM   Background Info  Elleigh Cassetta   DOB: 12-28-1950   MR#: [id number]   CSN#   [id number] 10/10/2013

## 2013-10-11 ENCOUNTER — Telehealth: Payer: Self-pay | Admitting: *Deleted

## 2013-10-11 MED ORDER — FLUCONAZOLE 150 MG PO TABS
150.0000 mg | ORAL_TABLET | Freq: Every day | ORAL | Status: DC
Start: 2013-10-11 — End: 2013-10-29

## 2013-10-11 MED ORDER — TRAMADOL HCL 50 MG PO TABS: 50.0000 mg | ORAL_TABLET | Freq: Four times a day (QID) | ORAL | Status: DC | PRN

## 2013-10-11 NOTE — Telephone Encounter (Signed)
Message from pt reporting joint pain persists. Taking Ibuprofen with little relief. Requesting something stronger for pain. Also reports she seems to be developing a vaginal yeast infection. Has had them in the past. Tried OTC cream which has not helped. Reviewed with Ned Card, NP. Orders received. Pt instructed to discontinue Lipitor in the setting of metastatic cancer. Tramadol Rx left in prescription book for pick up.

## 2013-10-14 ENCOUNTER — Telehealth: Payer: Self-pay | Admitting: *Deleted

## 2013-10-14 NOTE — Telephone Encounter (Signed)
Called pt to follow up on [facility] fax. Pt did not take Fluconazole, stated she was instructed not to. Stopped Lipitor. Pt stated she thought Tramadol was going to be called in to her pharmacy. Per C-A-N,  Tramadol was called in by Dr. Marin Olp (on call) with instructions to take 1-2 tabs Q6 hours PRN pain.  Pt reports she is still having symptoms of vaginal yeast infection. Informed her that it is OK to take Fluconazole as prescribed. She reports Tramadol was effective. Appetite is coming back, she feels better. Understands to call office as needed.

## 2013-10-16 ENCOUNTER — Telehealth: Payer: Self-pay | Admitting: *Deleted

## 2013-10-16 NOTE — Telephone Encounter (Signed)
Message from pt reporting pin point red spot on buttock and above knee. Has a "tender spot" on side of foot. Returned call, spot on buttocks is raised and red "like a pimple". Place above knee is "a rough patch that's flat and red." Instructed pt to come in for lab as scheduled 5/14. Wait in lobby for RN. She voiced understanding. Dr. Benay Spice made aware, no new orders.  Pt will be rescheduled to see Dr. Benay Spice 0800 morning of treatment 10/29/13. Will draw labs during office visit.

## 2013-10-17 ENCOUNTER — Other Ambulatory Visit: Payer: Self-pay | Admitting: *Deleted

## 2013-10-17 ENCOUNTER — Telehealth: Payer: Self-pay | Admitting: Oncology

## 2013-10-17 ENCOUNTER — Other Ambulatory Visit (HOSPITAL_BASED_OUTPATIENT_CLINIC_OR_DEPARTMENT_OTHER): Payer: BC Managed Care – PPO

## 2013-10-17 ENCOUNTER — Telehealth: Payer: Self-pay | Admitting: *Deleted

## 2013-10-17 DIAGNOSIS — C3410 Malignant neoplasm of upper lobe, unspecified bronchus or lung: Secondary | ICD-10-CM

## 2013-10-17 DIAGNOSIS — C3490 Malignant neoplasm of unspecified part of unspecified bronchus or lung: Secondary | ICD-10-CM

## 2013-10-17 LAB — CBC WITH DIFFERENTIAL/PLATELET
BASO%: 1.9 % (ref 0.0–2.0)
BASOS ABS: 0 10*3/uL (ref 0.0–0.1)
EOS ABS: 0.1 10*3/uL (ref 0.0–0.5)
EOS%: 4.4 % (ref 0.0–7.0)
HCT: 37.9 % (ref 34.8–46.6)
HEMOGLOBIN: 12.9 g/dL (ref 11.6–15.9)
LYMPH%: 60 % — AB (ref 14.0–49.7)
MCH: 30.3 pg (ref 25.1–34.0)
MCHC: 34 g/dL (ref 31.5–36.0)
MCV: 89 fL (ref 79.5–101.0)
MONO#: 0.3 10*3/uL (ref 0.1–0.9)
MONO%: 18.1 % — ABNORMAL HIGH (ref 0.0–14.0)
NEUT%: 15.6 % — ABNORMAL LOW (ref 38.4–76.8)
NEUTROS ABS: 0.3 10*3/uL — AB (ref 1.5–6.5)
PLATELETS: 183 10*3/uL (ref 145–400)
RBC: 4.26 10*6/uL (ref 3.70–5.45)
RDW: 13 % (ref 11.2–14.5)
WBC: 1.6 10*3/uL — ABNORMAL LOW (ref 3.9–10.3)
lymph#: 1 10*3/uL (ref 0.9–3.3)
nRBC: 0 % (ref 0–0)

## 2013-10-17 MED ORDER — CIPROFLOXACIN HCL 500 MG PO TABS: 500.0000 mg | ORAL_TABLET | Freq: Two times a day (BID) | ORAL | Status: DC

## 2013-10-17 NOTE — Telephone Encounter (Addendum)
CBC reviewed by Dr. Benay Spice, critical [place] noted. Order received to begin Cipro 500 BID x 7 days, repeat CBC 5/19. Spoke with pt in lobby, she denies any signs of infection. Neutropenic precautions reviewed. She voiced understanding. Pt has few sporadic, flat red splotchy areas on legs. Will monitor for now.

## 2013-10-17 NOTE — Telephone Encounter (Signed)
pt aware of d.t. for 5.19.15

## 2013-10-17 NOTE — Telephone Encounter (Signed)
s.w. pt husband and advised on cx appt on 5.22 and moved to 5.26 b4 tx...Marland Kitchenok and aware

## 2013-10-18 ENCOUNTER — Telehealth: Payer: Self-pay | Admitting: Oncology

## 2013-10-18 ENCOUNTER — Other Ambulatory Visit: Payer: Self-pay | Admitting: Oncology

## 2013-10-18 DIAGNOSIS — M719 Bursopathy, unspecified: Secondary | ICD-10-CM

## 2013-10-18 NOTE — Progress Notes (Signed)
10/07/2013 Patient reports that her weight 6 months ago in early November 2014 was approximately 181 to 183 pounds. With a current weight of 179.5 pounds, she has lost less than 5% of her body weight over the past six months.  Cindy S. Brigitte Pulse BSN, RN, [place] 10/07/2013 11:06 AM

## 2013-10-18 NOTE — Telephone Encounter (Signed)
pt called to r/s lab to earlier time on 5.19 done per pt request....pt aware of new  time

## 2013-10-21 ENCOUNTER — Other Ambulatory Visit: Payer: Self-pay | Admitting: Nurse Practitioner

## 2013-10-21 ENCOUNTER — Ambulatory Visit (HOSPITAL_BASED_OUTPATIENT_CLINIC_OR_DEPARTMENT_OTHER): Payer: BC Managed Care – PPO

## 2013-10-21 ENCOUNTER — Telehealth: Payer: Self-pay | Admitting: Oncology

## 2013-10-21 ENCOUNTER — Encounter: Payer: Self-pay | Admitting: Oncology

## 2013-10-21 DIAGNOSIS — C3490 Malignant neoplasm of unspecified part of unspecified bronchus or lung: Secondary | ICD-10-CM

## 2013-10-21 LAB — CBC WITH DIFFERENTIAL/PLATELET
BASO%: 1.6 % (ref 0.0–2.0)
BASOS ABS: 0 10*3/uL (ref 0.0–0.1)
EOS%: 2 % (ref 0.0–7.0)
Eosinophils Absolute: 0.1 10*3/uL (ref 0.0–0.5)
HEMATOCRIT: 39.3 % (ref 34.8–46.6)
HGB: 13.3 g/dL (ref 11.6–15.9)
LYMPH%: 48 % (ref 14.0–49.7)
MCH: 30.2 pg (ref 25.1–34.0)
MCHC: 33.8 g/dL (ref 31.5–36.0)
MCV: 89.1 fL (ref 79.5–101.0)
MONO#: 0.6 10*3/uL (ref 0.1–0.9)
MONO%: 24.6 % — ABNORMAL HIGH (ref 0.0–14.0)
NEUT#: 0.6 10*3/uL — ABNORMAL LOW (ref 1.5–6.5)
NEUT%: 23.8 % — AB (ref 38.4–76.8)
PLATELETS: 171 10*3/uL (ref 145–400)
RBC: 4.41 10*6/uL (ref 3.70–5.45)
RDW: 13.2 % (ref 11.2–14.5)
WBC: 2.6 10*3/uL — ABNORMAL LOW (ref 3.9–10.3)
lymph#: 1.2 10*3/uL (ref 0.9–3.3)

## 2013-10-21 NOTE — Telephone Encounter (Signed)
Called [facility] talked to [place] they want paper fax to them and they  will call pt , gave referral to HIM

## 2013-10-21 NOTE — Progress Notes (Signed)
Patient called and left a message about a cancer policy??? I called and no answer. Not sure if she needs bills.

## 2013-10-22 ENCOUNTER — Other Ambulatory Visit: Payer: Self-pay

## 2013-10-22 ENCOUNTER — Telehealth: Payer: Self-pay

## 2013-10-22 ENCOUNTER — Other Ambulatory Visit: Payer: BC Managed Care – PPO

## 2013-10-22 ENCOUNTER — Telehealth: Payer: Self-pay | Admitting: Oncology

## 2013-10-22 DIAGNOSIS — C3490 Malignant neoplasm of unspecified part of unspecified bronchus or lung: Secondary | ICD-10-CM

## 2013-10-22 NOTE — Telephone Encounter (Signed)
Message copied by Bevelyn Ngo on Tue Oct 22, 2013  9:38 AM ------      Message from: [place], [place] K      Created: Tue Oct 22, 2013  9:05 AM       Please let her know white count is better but still low. Continue antibiotic. We should repeat a CBC on 10/25/2013.      ----- Message -----         From: [facility]         Sent: 10/21/2013  11:46 AM           To: Owens Shark, NP                   ------

## 2013-10-22 NOTE — Telephone Encounter (Signed)
MEDICAL RECORDS FAXED TO DR. Nelva Bush OFFICE @ [phone]

## 2013-10-22 NOTE — Telephone Encounter (Signed)
Called and informed patient of lab results and to continue abx as prescribed. Informed patient Christina Hale would like to do a repeat CBC on 5/22 if patient was available. Patient stated she could come in on the 22nd for labs. Cleared with Christina Hale. Orders for CBC and POF sent to scheduling completed. Called patient back and informed her scheduling to be calling her with an appt. Patient denied any further questions at this time. Informed patient to call back with any questions or concerns.

## 2013-10-24 ENCOUNTER — Telehealth: Payer: Self-pay | Admitting: Oncology

## 2013-10-24 NOTE — Telephone Encounter (Signed)
pt cld to state she was to come in for a lab-per pof to sch 5/22-adv pt appt @10 :30. Pt understood

## 2013-10-25 ENCOUNTER — Ambulatory Visit: Payer: BC Managed Care – PPO | Admitting: Nurse Practitioner

## 2013-10-25 ENCOUNTER — Other Ambulatory Visit: Payer: BC Managed Care – PPO

## 2013-10-25 ENCOUNTER — Other Ambulatory Visit (HOSPITAL_BASED_OUTPATIENT_CLINIC_OR_DEPARTMENT_OTHER): Payer: BC Managed Care – PPO

## 2013-10-25 ENCOUNTER — Telehealth: Payer: Self-pay

## 2013-10-25 DIAGNOSIS — C3410 Malignant neoplasm of upper lobe, unspecified bronchus or lung: Secondary | ICD-10-CM

## 2013-10-25 DIAGNOSIS — C787 Secondary malignant neoplasm of liver and intrahepatic bile duct: Secondary | ICD-10-CM

## 2013-10-25 DIAGNOSIS — C3490 Malignant neoplasm of unspecified part of unspecified bronchus or lung: Secondary | ICD-10-CM

## 2013-10-25 LAB — CBC WITH DIFFERENTIAL/PLATELET
BASO%: 1.2 % (ref 0.0–2.0)
BASOS ABS: 0.1 10*3/uL (ref 0.0–0.1)
EOS%: 0.6 % (ref 0.0–7.0)
Eosinophils Absolute: 0 10*3/uL (ref 0.0–0.5)
HEMATOCRIT: 41.4 % (ref 34.8–46.6)
HEMOGLOBIN: 13.8 g/dL (ref 11.6–15.9)
LYMPH#: 1.4 10*3/uL (ref 0.9–3.3)
LYMPH%: 18.8 % (ref 14.0–49.7)
MCH: 29.9 pg (ref 25.1–34.0)
MCHC: 33.2 g/dL (ref 31.5–36.0)
MCV: 90.1 fL (ref 79.5–101.0)
MONO#: 0.7 10*3/uL (ref 0.1–0.9)
MONO%: 9.5 % (ref 0.0–14.0)
NEUT#: 5.4 10*3/uL (ref 1.5–6.5)
NEUT%: 69.9 % (ref 38.4–76.8)
PLATELETS: 153 10*3/uL (ref 145–400)
RBC: 4.6 10*6/uL (ref 3.70–5.45)
RDW: 13.6 % (ref 11.2–14.5)
WBC: 7.7 10*3/uL (ref 3.9–10.3)

## 2013-10-25 NOTE — Telephone Encounter (Signed)
Message copied by Bevelyn Ngo on Fri Oct 25, 2013  2:04 PM ------      Message from: [place], [place] K      Created: Fri Oct 25, 2013  1:12 PM       Please let her know white count is now normal. She can stop antibiotic.       ----- Message -----         From: [facility]         Sent: 10/25/2013  10:44 AM           To: Owens Shark, NP                   ------

## 2013-10-25 NOTE — Telephone Encounter (Signed)
Called and informed patient WBC was in normal range at 7.7 and that she cans top abx. Patient stated she took her last one this morning, Patient denies any problems, questions or concerns at this time. Informed patient to call back with any further questions or concerns. Patient confirmed Tuesday appt at 0830 with Dr.Sherrill and lab at 0800.

## 2013-10-28 ENCOUNTER — Other Ambulatory Visit: Payer: Self-pay | Admitting: Oncology

## 2013-10-29 ENCOUNTER — Encounter: Payer: BC Managed Care – PPO | Admitting: *Deleted

## 2013-10-29 ENCOUNTER — Ambulatory Visit (HOSPITAL_BASED_OUTPATIENT_CLINIC_OR_DEPARTMENT_OTHER): Payer: BC Managed Care – PPO

## 2013-10-29 ENCOUNTER — Ambulatory Visit: Payer: BC Managed Care – PPO | Admitting: Nurse Practitioner

## 2013-10-29 ENCOUNTER — Ambulatory Visit (HOSPITAL_BASED_OUTPATIENT_CLINIC_OR_DEPARTMENT_OTHER): Payer: BC Managed Care – PPO | Admitting: Oncology

## 2013-10-29 ENCOUNTER — Other Ambulatory Visit (HOSPITAL_BASED_OUTPATIENT_CLINIC_OR_DEPARTMENT_OTHER): Payer: BC Managed Care – PPO

## 2013-10-29 ENCOUNTER — Other Ambulatory Visit: Payer: BC Managed Care – PPO

## 2013-10-29 VITALS — BP 127/77 | HR 85 | Resp 18

## 2013-10-29 VITALS — BP 129/69 | HR 95 | Temp 98.1°F | Resp 18 | Ht 70.0 in | Wt 177.6 lb

## 2013-10-29 DIAGNOSIS — C3410 Malignant neoplasm of upper lobe, unspecified bronchus or lung: Secondary | ICD-10-CM

## 2013-10-29 DIAGNOSIS — C3490 Malignant neoplasm of unspecified part of unspecified bronchus or lung: Secondary | ICD-10-CM

## 2013-10-29 DIAGNOSIS — R112 Nausea with vomiting, unspecified: Secondary | ICD-10-CM

## 2013-10-29 DIAGNOSIS — D702 Other drug-induced agranulocytosis: Secondary | ICD-10-CM

## 2013-10-29 DIAGNOSIS — Z5112 Encounter for antineoplastic immunotherapy: Secondary | ICD-10-CM

## 2013-10-29 DIAGNOSIS — C787 Secondary malignant neoplasm of liver and intrahepatic bile duct: Secondary | ICD-10-CM

## 2013-10-29 DIAGNOSIS — A0472 Enterocolitis due to Clostridium difficile, not specified as recurrent: Secondary | ICD-10-CM

## 2013-10-29 DIAGNOSIS — Z5111 Encounter for antineoplastic chemotherapy: Secondary | ICD-10-CM

## 2013-10-29 LAB — CBC WITH DIFFERENTIAL/PLATELET
BASO%: 0.8 % (ref 0.0–2.0)
BASOS ABS: 0.1 10*3/uL (ref 0.0–0.1)
EOS%: 1.2 % (ref 0.0–7.0)
Eosinophils Absolute: 0.1 10*3/uL (ref 0.0–0.5)
HEMATOCRIT: 41.6 % (ref 34.8–46.6)
HGB: 14 g/dL (ref 11.6–15.9)
LYMPH%: 22.2 % (ref 14.0–49.7)
MCH: 30.1 pg (ref 25.1–34.0)
MCHC: 33.7 g/dL (ref 31.5–36.0)
MCV: 89.5 fL (ref 79.5–101.0)
MONO#: 0.5 10*3/uL (ref 0.1–0.9)
MONO%: 7 % (ref 0.0–14.0)
NEUT#: 4.4 10*3/uL (ref 1.5–6.5)
NEUT%: 68.8 % (ref 38.4–76.8)
Platelets: 135 10*3/uL — ABNORMAL LOW (ref 145–400)
RBC: 4.65 10*6/uL (ref 3.70–5.45)
RDW: 13.6 % (ref 11.2–14.5)
WBC: 6.4 10*3/uL (ref 3.9–10.3)
lymph#: 1.4 10*3/uL (ref 0.9–3.3)

## 2013-10-29 LAB — COMPREHENSIVE METABOLIC PANEL (CC13)
ALK PHOS: 106 U/L (ref 40–150)
ALT: 17 U/L (ref 0–55)
AST: 17 U/L (ref 5–34)
Albumin: 3.3 g/dL — ABNORMAL LOW (ref 3.5–5.0)
Anion Gap: 11 mEq/L (ref 3–11)
BUN: 10.3 mg/dL (ref 7.0–26.0)
CALCIUM: 9.7 mg/dL (ref 8.4–10.4)
CO2: 25 mEq/L (ref 22–29)
CREATININE: 0.7 mg/dL (ref 0.6–1.1)
Chloride: 104 mEq/L (ref 98–109)
Glucose: 97 mg/dl (ref 70–140)
Potassium: 4.4 mEq/L (ref 3.5–5.1)
Sodium: 140 mEq/L (ref 136–145)
Total Bilirubin: 0.21 mg/dL (ref 0.20–1.20)
Total Protein: 7.6 g/dL (ref 6.4–8.3)

## 2013-10-29 LAB — LACTATE DEHYDROGENASE (CC13): LDH: 187 U/L (ref 125–245)

## 2013-10-29 LAB — UA PROTEIN, DIPSTICK - CHCC: Protein, ur: NEGATIVE mg/dL

## 2013-10-29 LAB — MAGNESIUM (CC13): MAGNESIUM: 2 mg/dL (ref 1.5–2.5)

## 2013-10-29 MED ORDER — PALONOSETRON HCL INJECTION 0.25 MG/5ML
0.2500 mg | Freq: Once | INTRAVENOUS | Status: AC
  Administered 2013-10-29: 0.25 mg via INTRAVENOUS

## 2013-10-29 MED ORDER — DEXAMETHASONE SODIUM PHOSPHATE 20 MG/5ML IJ SOLN
INTRAMUSCULAR | Status: AC
  Filled 2013-10-29: qty 5

## 2013-10-29 MED ORDER — SODIUM CHLORIDE 0.9 % IJ SOLN
10.0000 mL | INTRAMUSCULAR | Status: DC | PRN
  Administered 2013-10-29: 10 mL
  Filled 2013-10-29: qty 10

## 2013-10-29 MED ORDER — DIPHENHYDRAMINE HCL 50 MG/ML IJ SOLN
INTRAMUSCULAR | Status: AC
  Filled 2013-10-29: qty 1

## 2013-10-29 MED ORDER — FAMOTIDINE IN NACL 20-0.9 MG/50ML-% IV SOLN
20.0000 mg | Freq: Once | INTRAVENOUS | Status: AC
  Administered 2013-10-29: 20 mg via INTRAVENOUS

## 2013-10-29 MED ORDER — SODIUM CHLORIDE 0.9 % IV SOLN
790.0000 mg | Freq: Once | INTRAVENOUS | Status: AC
  Administered 2013-10-29: 790 mg via INTRAVENOUS
  Filled 2013-10-29: qty 79

## 2013-10-29 MED ORDER — FAMOTIDINE IN NACL 20-0.9 MG/50ML-% IV SOLN
INTRAVENOUS | Status: AC
  Filled 2013-10-29: qty 50

## 2013-10-29 MED ORDER — FOSAPREPITANT DIMEGLUMINE INJECTION 150 MG
150.0000 mg | Freq: Once | INTRAVENOUS | Status: AC
  Administered 2013-10-29: 150 mg via INTRAVENOUS
  Filled 2013-10-29: qty 5

## 2013-10-29 MED ORDER — SODIUM CHLORIDE 0.9 % IV SOLN
Freq: Once | INTRAVENOUS | Status: AC
  Administered 2013-10-29: 09:00:00 via INTRAVENOUS

## 2013-10-29 MED ORDER — DEXAMETHASONE SODIUM PHOSPHATE 20 MG/5ML IJ SOLN
12.0000 mg | Freq: Once | INTRAMUSCULAR | Status: AC
  Administered 2013-10-29: 12 mg via INTRAVENOUS

## 2013-10-29 MED ORDER — DEXTROSE 5 % IV SOLN
200.0000 mg/m2 | Freq: Once | INTRAVENOUS | Status: AC
  Administered 2013-10-29: 402 mg via INTRAVENOUS
  Filled 2013-10-29: qty 67

## 2013-10-29 MED ORDER — SODIUM CHLORIDE 0.9 % IV SOLN
Freq: Once | INTRAVENOUS | Status: AC
  Administered 2013-10-29: 16:00:00 via INTRAVENOUS

## 2013-10-29 MED ORDER — PALONOSETRON HCL INJECTION 0.25 MG/5ML
INTRAVENOUS | Status: AC
  Filled 2013-10-29: qty 5

## 2013-10-29 MED ORDER — SODIUM CHLORIDE 0.9 % IV SOLN
15.0000 mg/kg | Freq: Once | INTRAVENOUS | Status: AC
  Administered 2013-10-29: 1225 mg via INTRAVENOUS
  Filled 2013-10-29: qty 49

## 2013-10-29 MED ORDER — DIPHENHYDRAMINE HCL 50 MG/ML IJ SOLN
50.0000 mg | Freq: Once | INTRAMUSCULAR | Status: AC
  Administered 2013-10-29: 50 mg via INTRAVENOUS

## 2013-10-29 MED ORDER — HEPARIN SOD (PORK) LOCK FLUSH 100 UNIT/ML IV SOLN
500.0000 [IU] | Freq: Once | INTRAVENOUS | Status: AC | PRN
  Administered 2013-10-29: 500 [IU]
  Filled 2013-10-29: qty 5

## 2013-10-29 NOTE — Patient Instructions (Signed)
[facility] Discharge Instructions for Patients Receiving Chemotherapy  Today you received the following chemotherapy agents Taxol, Carboplatin, and Avastin.  To help prevent nausea and vomiting after your treatment, we encourage you to take your nausea medication as prescribed.   If you develop nausea and vomiting that is not controlled by your nausea medication, call the clinic.   BELOW ARE SYMPTOMS THAT SHOULD BE REPORTED IMMEDIATELY:  *FEVER GREATER THAN 100.5 F  *CHILLS WITH OR WITHOUT FEVER  NAUSEA AND VOMITING THAT IS NOT CONTROLLED WITH YOUR NAUSEA MEDICATION  *UNUSUAL SHORTNESS OF BREATH  *UNUSUAL BRUISING OR BLEEDING  TENDERNESS IN MOUTH AND THROAT WITH OR WITHOUT PRESENCE OF ULCERS  *URINARY PROBLEMS  *BOWEL PROBLEMS  UNUSUAL RASH Items with * indicate a potential emergency and should be followed up as soon as possible.  Feel free to call the clinic you have any questions or concerns. The clinic phone number is [phone].

## 2013-10-29 NOTE — Progress Notes (Signed)
10/29/2013 Patient in to clinic today for evaluation prior to receiving treatment cycle 2. Patient reports significant hair loss during the past cycle, and is currently wearing a head covering. Based on review of toxicities reported during the past cycle, as well as lab results review, including interim hematology values, patient has not experienced any toxicities requiring dose modification, as confirmed by Dr. Benay Spice. Due to significant neutropenia in cycle 1, patient will receive G-CSF per institution standard on day 2 of remaining induction therapy cycles. Based on history and physical and lab results review by Dr. Benay Spice, patient condition is acceptable for continued treatment. Sign for infusion given to Elray Buba RN. Cindy S. Brigitte Pulse BSN, RN, CCRP 10/29/2013 11:40 AM

## 2013-10-29 NOTE — Progress Notes (Signed)
Pt saw Dr. Benay Spice today prior to chemo.  Dr. Benay Spice and Tyrell Antonio, research nurse aware of all lab results.

## 2013-10-29 NOTE — Progress Notes (Signed)
  [place] OFFICE PROGRESS NOTE   Diagnosis: Non-small cell lung cancer  INTERVAL HISTORY:   She completed a first cycle of Taxol/carboplatin 10/07/2013. She developed bone pain following chemotherapy. She took tramadol for 2 days and the pain was relieved. She had nausea beginning 3-4 days after chemotherapy and lasting for several days. She had a few episodes of emesis. Mild numbness in the hands and feet. She had some discomfort when opening jars with her hands. Mild nonproductive cough. The mouth was sore following chemotherapy, but there were no discrete ulcers. Good appetite.  She developed severe neutropenia following chemotherapy. She was placed on prophylactic ciprofloxacin. A vaginal yeast infection was treated with Diflucan.  Objective:  Vital signs in last 24 hours:  Blood pressure 129/69, pulse 95, temperature 98.1 F (36.7 C), temperature source Oral, resp. rate 18, height $RemoveBe'5\' 10"'RDSCrskEn$  (1.778 m), weight 177 lb 9.6 oz (80.559 kg).    HEENT: No thrush or ulcers Resp: Lungs clear bilaterally Cardio: Regular rate and rhythm GI: No hepatosplenomegaly, nontender Vascular: No leg edema  Skin: Scattered moles over the trunk and extremities.   Portacath/PICC-without erythema  Lab Results:  Lab Results  Component Value Date   WBC 6.4 10/29/2013   HGB 14.0 10/29/2013   HCT 41.6 10/29/2013   MCV 89.5 10/29/2013   PLT 135* 10/29/2013   NEUTROABS 4.4 10/29/2013    Medications: I have reviewed the patient's current medications.  Assessment/Plan: 1. Stage IV adenocarcinoma of the lung, dominant left upper lobe mass and spiculated right upper lobe mass Status post a biopsy of a left hepatic mass confirming metastatic adenocarcinoma consistent with a lung primary, ALK mutation negative, EGFR amplification suboptimal-repeat testing recommend  Staging CT scans 24 2015 confirmed a dominant left upper lung mass, a smaller right upper lung mass, left hepatic mass, and left  external iliac node Cycle 1 Taxol/carboplatin per the  CTSU A0045 study 10/09/2013 2. C. difficile colitis March 2015 3. Hyponatremia/seizure on hospital admission 08/29/2013 4. Chronic right hip "bursitis " 5. Nausea and vomiting following cycle 1 Taxol/carboplatin, the anti-emetic regimen will be adjusted with cycle 2 6. Severe neutropenia following cycle 1 Taxol/carboplatin-Neulasta added with cycle 2   Disposition:  She has completed one cycle of Taxol/carboplatin. The chemotherapy was constipated by delayed nausea and neutropenia. Neulasta will be added with cycle 2 and the antiemetic regimen will be adjusted.  She will return for a nadir CBC 11/08/2013. Cycle 3 Taxol/carboplatin will be given on 11/18/2013.  Her ECOG performance status is measured at 0-1 today.  Ladell Pier, MD  10/29/2013  8:38 AM

## 2013-10-30 ENCOUNTER — Ambulatory Visit (HOSPITAL_BASED_OUTPATIENT_CLINIC_OR_DEPARTMENT_OTHER): Payer: BC Managed Care – PPO

## 2013-10-30 ENCOUNTER — Ambulatory Visit: Payer: BC Managed Care – PPO

## 2013-10-30 VITALS — BP 135/68 | HR 92 | Temp 98.0°F

## 2013-10-30 DIAGNOSIS — Z5189 Encounter for other specified aftercare: Secondary | ICD-10-CM

## 2013-10-30 DIAGNOSIS — C3490 Malignant neoplasm of unspecified part of unspecified bronchus or lung: Secondary | ICD-10-CM

## 2013-10-30 DIAGNOSIS — C787 Secondary malignant neoplasm of liver and intrahepatic bile duct: Secondary | ICD-10-CM

## 2013-10-30 DIAGNOSIS — C3410 Malignant neoplasm of upper lobe, unspecified bronchus or lung: Secondary | ICD-10-CM

## 2013-10-30 MED ORDER — PEGFILGRASTIM INJECTION 6 MG/0.6ML
6.0000 mg | Freq: Once | SUBCUTANEOUS | Status: AC
  Administered 2013-10-30: 6 mg via SUBCUTANEOUS
  Filled 2013-10-30: qty 0.6

## 2013-10-30 NOTE — Patient Instructions (Signed)

## 2013-10-31 ENCOUNTER — Other Ambulatory Visit: Payer: Self-pay | Admitting: *Deleted

## 2013-10-31 ENCOUNTER — Telehealth: Payer: Self-pay | Admitting: Oncology

## 2013-10-31 NOTE — Telephone Encounter (Signed)
Sent michelle a staff message to r/s the chemo appts from 6/15 to 6/16/and from 7/6 to 12/16/2013 per protocal.

## 2013-11-01 ENCOUNTER — Telehealth: Payer: Self-pay | Admitting: *Deleted

## 2013-11-01 NOTE — Telephone Encounter (Signed)
Per POF and staff message I have moved appt

## 2013-11-12 ENCOUNTER — Encounter: Payer: Self-pay | Admitting: Oncology

## 2013-11-12 NOTE — Progress Notes (Signed)
Insurance is paying 100% for Avastin- [id number]

## 2013-11-14 ENCOUNTER — Encounter (HOSPITAL_COMMUNITY): Payer: Self-pay

## 2013-11-14 ENCOUNTER — Ambulatory Visit (HOSPITAL_COMMUNITY)
Admission: RE | Admit: 2013-11-14 | Discharge: 2013-11-14 | Disposition: A | Discharge status: Home / Self Care | Payer: BC Managed Care – PPO | Source: Ambulatory Visit | Attending: Oncology | Admitting: Oncology

## 2013-11-14 ENCOUNTER — Other Ambulatory Visit: Payer: Self-pay | Admitting: Oncology

## 2013-11-14 DIAGNOSIS — C3490 Malignant neoplasm of unspecified part of unspecified bronchus or lung: Secondary | ICD-10-CM

## 2013-11-14 DIAGNOSIS — C3410 Malignant neoplasm of upper lobe, unspecified bronchus or lung: Secondary | ICD-10-CM | POA: Insufficient documentation

## 2013-11-14 DIAGNOSIS — C787 Secondary malignant neoplasm of liver and intrahepatic bile duct: Secondary | ICD-10-CM | POA: Insufficient documentation

## 2013-11-14 MED ORDER — IOHEXOL 300 MG/ML  SOLN: 100.0000 mL | Freq: Once | INTRAMUSCULAR | Status: DC | PRN

## 2013-11-15 ENCOUNTER — Telehealth: Payer: Self-pay | Admitting: *Deleted

## 2013-11-15 NOTE — Telephone Encounter (Signed)
Notified of CT results. Was very happy and appreciative for call.

## 2013-11-15 NOTE — Telephone Encounter (Signed)
Message copied by Tania Ade on Fri Nov 15, 2013  5:20 PM ------      Message from: [place] B      Created: Thu Nov 14, 2013  9:57 PM       Please call patient, cts show improvement in lung mass and liver leison,f/u as scheduled ------

## 2013-11-17 ENCOUNTER — Other Ambulatory Visit: Payer: Self-pay | Admitting: Oncology

## 2013-11-18 ENCOUNTER — Other Ambulatory Visit: Payer: BC Managed Care – PPO

## 2013-11-18 ENCOUNTER — Ambulatory Visit: Payer: BC Managed Care – PPO

## 2013-11-18 ENCOUNTER — Ambulatory Visit: Payer: BC Managed Care – PPO | Admitting: Nurse Practitioner

## 2013-11-19 ENCOUNTER — Other Ambulatory Visit (HOSPITAL_BASED_OUTPATIENT_CLINIC_OR_DEPARTMENT_OTHER): Payer: BC Managed Care – PPO

## 2013-11-19 ENCOUNTER — Ambulatory Visit (HOSPITAL_BASED_OUTPATIENT_CLINIC_OR_DEPARTMENT_OTHER): Payer: BC Managed Care – PPO | Admitting: Nurse Practitioner

## 2013-11-19 ENCOUNTER — Ambulatory Visit: Payer: BC Managed Care – PPO

## 2013-11-19 ENCOUNTER — Encounter: Payer: BC Managed Care – PPO | Admitting: *Deleted

## 2013-11-19 ENCOUNTER — Telehealth: Payer: Self-pay | Admitting: Oncology

## 2013-11-19 ENCOUNTER — Telehealth: Payer: Self-pay | Admitting: *Deleted

## 2013-11-19 VITALS — BP 140/64 | HR 93 | Temp 97.5°F | Resp 18 | Ht 70.0 in | Wt 175.5 lb

## 2013-11-19 DIAGNOSIS — C3490 Malignant neoplasm of unspecified part of unspecified bronchus or lung: Secondary | ICD-10-CM

## 2013-11-19 DIAGNOSIS — C787 Secondary malignant neoplasm of liver and intrahepatic bile duct: Secondary | ICD-10-CM

## 2013-11-19 DIAGNOSIS — C3410 Malignant neoplasm of upper lobe, unspecified bronchus or lung: Secondary | ICD-10-CM

## 2013-11-19 DIAGNOSIS — D702 Other drug-induced agranulocytosis: Secondary | ICD-10-CM

## 2013-11-19 DIAGNOSIS — G620 Drug-induced polyneuropathy: Secondary | ICD-10-CM

## 2013-11-19 DIAGNOSIS — D649 Anemia, unspecified: Secondary | ICD-10-CM

## 2013-11-19 LAB — COMPREHENSIVE METABOLIC PANEL (CC13)
ALT: 13 U/L (ref 0–55)
ANION GAP: 8 meq/L (ref 3–11)
AST: 14 U/L (ref 5–34)
Albumin: 2.5 g/dL — ABNORMAL LOW (ref 3.5–5.0)
Alkaline Phosphatase: 104 U/L (ref 40–150)
BUN: 11 mg/dL (ref 7.0–26.0)
CO2: 24 meq/L (ref 22–29)
Calcium: 9.8 mg/dL (ref 8.4–10.4)
Chloride: 104 mEq/L (ref 98–109)
Creatinine: 0.7 mg/dL (ref 0.6–1.1)
GLUCOSE: 98 mg/dL (ref 70–140)
POTASSIUM: 4.7 meq/L (ref 3.5–5.1)
SODIUM: 136 meq/L (ref 136–145)
TOTAL PROTEIN: 7.9 g/dL (ref 6.4–8.3)
Total Bilirubin: 0.35 mg/dL (ref 0.20–1.20)

## 2013-11-19 LAB — CBC WITH DIFFERENTIAL/PLATELET
BASO%: 0.5 % (ref 0.0–2.0)
Basophils Absolute: 0 10*3/uL (ref 0.0–0.1)
EOS ABS: 0 10*3/uL (ref 0.0–0.5)
EOS%: 0.4 % (ref 0.0–7.0)
HCT: 32.5 % — ABNORMAL LOW (ref 34.8–46.6)
HGB: 10.8 g/dL — ABNORMAL LOW (ref 11.6–15.9)
LYMPH%: 11.5 % — AB (ref 14.0–49.7)
MCH: 29.3 pg (ref 25.1–34.0)
MCHC: 33.2 g/dL (ref 31.5–36.0)
MCV: 88.3 fL (ref 79.5–101.0)
MONO#: 0.6 10*3/uL (ref 0.1–0.9)
MONO%: 7.3 % (ref 0.0–14.0)
NEUT%: 80.3 % — ABNORMAL HIGH (ref 38.4–76.8)
NEUTROS ABS: 6.6 10*3/uL — AB (ref 1.5–6.5)
PLATELETS: 250 10*3/uL (ref 145–400)
RBC: 3.68 10*6/uL — ABNORMAL LOW (ref 3.70–5.45)
RDW: 14.3 % (ref 11.2–14.5)
WBC: 8.2 10*3/uL (ref 3.9–10.3)
lymph#: 1 10*3/uL (ref 0.9–3.3)

## 2013-11-19 LAB — UA PROTEIN, DIPSTICK - CHCC: Protein, ur: NEGATIVE mg/dL

## 2013-11-19 LAB — MAGNESIUM (CC13): Magnesium: 2 mg/dl (ref 1.5–2.5)

## 2013-11-19 LAB — LACTATE DEHYDROGENASE (CC13): LDH: 148 U/L (ref 125–245)

## 2013-11-19 NOTE — Progress Notes (Addendum)
[place] OFFICE PROGRESS NOTE   Diagnosis:  Non-small cell lung cancer.  INTERVAL HISTORY:   Ms. Nay returns as scheduled. She completed cycle 2 Taxol/carboplatin and Avastin on 10/29/2013. She denies nausea/vomiting. She had a few mouth sores which have resolved. She was able to eat and drink without difficulty. She had some constipation last week. She takes a laxative as needed. She has intermittent tingling in the feet. She feels like she is walking on "bubble wrap". The feet became red following chemotherapy. No hand or foot pain. She has noted difficulty opening jars due to grip strength. She denies bleeding. No bloody or black stools. Abdominal pain. She denies shortness breath, cough and fever. No chest pain. No leg swelling or calf pain  Objective:  Vital signs in last 24 hours:  Blood pressure 140/64, pulse 93, temperature 97.5 F (36.4 C), temperature source Oral, resp. rate 18, height 5' 10"  (1.778 m), weight 175 lb 8 oz (79.606 kg), SpO2 100.00%.    HEENT: No thrush or ulcerations. Resp: Lungs clear. Cardio: Regular cardiac rhythm. GI: Abdomen soft and nontender. No organomegaly. Vascular: No leg edema. Neuro: Vibratory sense moderately decreased over the fingertips per tuning fork exam.  Skin: Toes and soles of feet with erythema. No skin breakdown. Mild erythema over the palms. No skin breakdown.  Port-A-Cath site without erythema.    Lab Results:  Lab Results  Component Value Date   WBC 8.2 11/19/2013   HGB 10.8 Repeated and Verified* 11/19/2013   HCT 32.5* 11/19/2013   MCV 88.3 11/19/2013   PLT 250 11/19/2013   NEUTROABS 6.6* 11/19/2013    Imaging:  No results found.  Medications: I have reviewed the patient's current medications.  Assessment/Plan: 1. Stage IV adenocarcinoma of the lung, dominant left upper lobe mass and spiculated right upper lobe mass Status post a biopsy of a left hepatic mass confirming metastatic adenocarcinoma  consistent with a lung primary, ALK mutation negative, EGFR amplification suboptimal-repeat testing recommend  Staging CT scans 09/27/2013 confirmed a dominant left upper lung mass, a smaller right upper lung mass, left hepatic mass, and left external iliac node.  Cycle 1 Taxol/carboplatin and Avastin per the CTSU A6301 study 10/09/2013. Cycle 2 Taxol/carboplatin and Avastin 10/29/2013. Restaging CT evaluation 11/14/2013 with improvement in the lungs and liver. Left external iliac lymph node was stable. 2. C. difficile colitis March 2015 3. Hyponatremia/seizure on hospital admission 08/29/2013 4. Chronic right hip "bursitis". 5. Nausea and vomiting following cycle 1 Taxol/carboplatin, the anti-emetic regimenwas adjusted with cycle 2. 6. Severe neutropenia following cycle 1 Taxol/carboplatin-Neulasta added with cycle 2. 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 9. Anemia. Likely secondary to chemotherapy. No clinical evidence of bleeding.   Disposition: She has completed 2 cycles of Taxol/carboplatin and Avastin. Restaging CT evaluation shows improvement in the lungs and liver.   She developed hand-foot syndrome and neuropathy following cycle 2. Plan to hold treatment today and reschedule to 12/02/2013 (per patient request due to a vacation). The Taxol will be dose reduced per study guidelines.  We will see her in followup on 12/02/2013 prior to treatment. She will contact the office in the interim with any problems.  ECoG performance status measured at 1.   Patient seen with Dr. Benay Spice. 25 minutes were spent face-to-face at today's visit that the majority of that time involved in counseling/coordination of care.    Ned Card ANP/GNP-BC   11/19/2013  9:37 AM  This was a shared visit  with Ned Card. Ms. Metzinger has completed 2 treatments with Taxol/carboplatin/Avastin. The restaging CT is consistent with a clinical response. Chemotherapy will be  held today secondary to neutropenia. The chemotherapy will be dose reduced secondary to neuropathy and hand/foot syndrome.  Julieanne Manson, M.D.

## 2013-11-19 NOTE — Progress Notes (Signed)
11/19/2013 Patient in to clinic today for evaluation prior to receiving induction treatment Cycle 3. Review of CT scans obtained on 11/14/2013 confirmed presence of partial response to treatment, as confirmed by Dr. Benay Spice. Patient has evidence of grade 2 peripheral neuropathy; as a result, treatment will be held today, with planned dose reduction of paclitaxel with the next treatment cycle. Due to patient's upcoming beach trip, June 20-27, treatment is scheduled to resume on Monday, June 29th. Patient is in agreement with this plan. Cindy S. Brigitte Pulse BSN, RN, [facility] 11/19/2013 3:32 PM

## 2013-11-19 NOTE — Telephone Encounter (Signed)
gv adn printed appt sched and avs for pt for June and July...sed added tx....emailed MW to add tx.

## 2013-11-19 NOTE — Telephone Encounter (Signed)
Per staff message and POF I have scheduled appts. Advised scheduler of appts. JMW  

## 2013-11-20 ENCOUNTER — Ambulatory Visit: Payer: BC Managed Care – PPO

## 2013-11-20 ENCOUNTER — Encounter: Payer: Self-pay | Admitting: Oncology

## 2013-11-20 NOTE — Progress Notes (Signed)
Insurance is paying Avastin [id number] 100%

## 2013-11-21 ENCOUNTER — Ambulatory Visit: Payer: Self-pay | Admitting: Emergency Medicine

## 2013-12-01 ENCOUNTER — Other Ambulatory Visit: Payer: Self-pay | Admitting: Oncology

## 2013-12-02 ENCOUNTER — Encounter: Payer: BC Managed Care – PPO | Admitting: *Deleted

## 2013-12-02 ENCOUNTER — Telehealth: Payer: Self-pay | Admitting: Oncology

## 2013-12-02 ENCOUNTER — Telehealth: Payer: Self-pay | Admitting: *Deleted

## 2013-12-02 ENCOUNTER — Ambulatory Visit (HOSPITAL_BASED_OUTPATIENT_CLINIC_OR_DEPARTMENT_OTHER): Payer: BC Managed Care – PPO | Admitting: Nurse Practitioner

## 2013-12-02 ENCOUNTER — Other Ambulatory Visit (HOSPITAL_BASED_OUTPATIENT_CLINIC_OR_DEPARTMENT_OTHER): Payer: BC Managed Care – PPO

## 2013-12-02 ENCOUNTER — Ambulatory Visit (HOSPITAL_BASED_OUTPATIENT_CLINIC_OR_DEPARTMENT_OTHER): Payer: BC Managed Care – PPO

## 2013-12-02 VITALS — BP 150/72 | HR 88 | Temp 97.0°F | Resp 20 | Ht 70.0 in | Wt 172.0 lb

## 2013-12-02 DIAGNOSIS — C3490 Malignant neoplasm of unspecified part of unspecified bronchus or lung: Secondary | ICD-10-CM

## 2013-12-02 DIAGNOSIS — G620 Drug-induced polyneuropathy: Secondary | ICD-10-CM

## 2013-12-02 DIAGNOSIS — C3412 Malignant neoplasm of upper lobe, left bronchus or lung: Secondary | ICD-10-CM

## 2013-12-02 DIAGNOSIS — C787 Secondary malignant neoplasm of liver and intrahepatic bile duct: Secondary | ICD-10-CM

## 2013-12-02 DIAGNOSIS — Z5112 Encounter for antineoplastic immunotherapy: Secondary | ICD-10-CM

## 2013-12-02 DIAGNOSIS — T451X5A Adverse effect of antineoplastic and immunosuppressive drugs, initial encounter: Secondary | ICD-10-CM

## 2013-12-02 DIAGNOSIS — Z5111 Encounter for antineoplastic chemotherapy: Secondary | ICD-10-CM

## 2013-12-02 DIAGNOSIS — R609 Edema, unspecified: Secondary | ICD-10-CM

## 2013-12-02 LAB — COMPREHENSIVE METABOLIC PANEL (CC13)
ALBUMIN: 3.2 g/dL — AB (ref 3.5–5.0)
ALK PHOS: 93 U/L (ref 40–150)
ALT: 10 U/L (ref 0–55)
AST: 13 U/L (ref 5–34)
Anion Gap: 9 mEq/L (ref 3–11)
BUN: 11.8 mg/dL (ref 7.0–26.0)
CALCIUM: 9.6 mg/dL (ref 8.4–10.4)
CHLORIDE: 103 meq/L (ref 98–109)
CO2: 25 mEq/L (ref 22–29)
Creatinine: 0.8 mg/dL (ref 0.6–1.1)
GLUCOSE: 99 mg/dL (ref 70–140)
Potassium: 4.2 mEq/L (ref 3.5–5.1)
SODIUM: 137 meq/L (ref 136–145)
Total Bilirubin: 0.37 mg/dL (ref 0.20–1.20)
Total Protein: 7.9 g/dL (ref 6.4–8.3)

## 2013-12-02 LAB — CBC WITH DIFFERENTIAL/PLATELET
BASO%: 1.3 % (ref 0.0–2.0)
BASOS ABS: 0.1 10*3/uL (ref 0.0–0.1)
EOS ABS: 0.1 10*3/uL (ref 0.0–0.5)
EOS%: 1.3 % (ref 0.0–7.0)
HCT: 36.3 % (ref 34.8–46.6)
HEMOGLOBIN: 12 g/dL (ref 11.6–15.9)
LYMPH#: 1.3 10*3/uL (ref 0.9–3.3)
LYMPH%: 18.1 % (ref 14.0–49.7)
MCH: 30 pg (ref 25.1–34.0)
MCHC: 33.2 g/dL (ref 31.5–36.0)
MCV: 90.3 fL (ref 79.5–101.0)
MONO#: 0.5 10*3/uL (ref 0.1–0.9)
MONO%: 7.5 % (ref 0.0–14.0)
NEUT#: 5 10*3/uL (ref 1.5–6.5)
NEUT%: 71.8 % (ref 38.4–76.8)
Platelets: 216 10*3/uL (ref 145–400)
RBC: 4.02 10*6/uL (ref 3.70–5.45)
RDW: 15.1 % — ABNORMAL HIGH (ref 11.2–14.5)
WBC: 6.9 10*3/uL (ref 3.9–10.3)

## 2013-12-02 LAB — UA PROTEIN, DIPSTICK - CHCC: PROTEIN: NEGATIVE mg/dL

## 2013-12-02 LAB — MAGNESIUM (CC13): MAGNESIUM: 1.8 mg/dL (ref 1.5–2.5)

## 2013-12-02 LAB — LACTATE DEHYDROGENASE (CC13): LDH: 163 U/L (ref 125–245)

## 2013-12-02 MED ORDER — DEXAMETHASONE SODIUM PHOSPHATE 20 MG/5ML IJ SOLN
INTRAMUSCULAR | Status: AC
  Filled 2013-12-02: qty 5

## 2013-12-02 MED ORDER — FOSAPREPITANT DIMEGLUMINE INJECTION 150 MG
150.0000 mg | Freq: Once | INTRAVENOUS | Status: AC
  Administered 2013-12-02: 150 mg via INTRAVENOUS
  Filled 2013-12-02: qty 5

## 2013-12-02 MED ORDER — HEPARIN SOD (PORK) LOCK FLUSH 100 UNIT/ML IV SOLN
500.0000 [IU] | Freq: Once | INTRAVENOUS | Status: AC | PRN
  Administered 2013-12-02: 500 [IU]
  Filled 2013-12-02: qty 5

## 2013-12-02 MED ORDER — SODIUM CHLORIDE 0.9 % IJ SOLN
10.0000 mL | INTRAMUSCULAR | Status: DC | PRN
  Administered 2013-12-02: 10 mL
  Filled 2013-12-02: qty 10

## 2013-12-02 MED ORDER — PALONOSETRON HCL INJECTION 0.25 MG/5ML
INTRAVENOUS | Status: AC
  Filled 2013-12-02: qty 5

## 2013-12-02 MED ORDER — TRAMADOL HCL 50 MG PO TABS: 50.0000 mg | ORAL_TABLET | Freq: Four times a day (QID) | ORAL | Status: DC | PRN

## 2013-12-02 MED ORDER — PALONOSETRON HCL INJECTION 0.25 MG/5ML
0.2500 mg | Freq: Once | INTRAVENOUS | Status: AC
  Administered 2013-12-02: 0.25 mg via INTRAVENOUS

## 2013-12-02 MED ORDER — DIPHENHYDRAMINE HCL 50 MG/ML IJ SOLN
INTRAMUSCULAR | Status: AC
  Filled 2013-12-02: qty 1

## 2013-12-02 MED ORDER — SODIUM CHLORIDE 0.9 % IV SOLN
Freq: Once | INTRAVENOUS | Status: AC
  Administered 2013-12-02: 12:00:00 via INTRAVENOUS

## 2013-12-02 MED ORDER — FAMOTIDINE IN NACL 20-0.9 MG/50ML-% IV SOLN
INTRAVENOUS | Status: AC
  Filled 2013-12-02: qty 50

## 2013-12-02 MED ORDER — PACLITAXEL CHEMO INJECTION 300 MG/50ML
160.0000 mg/m2 | Freq: Once | INTRAVENOUS | Status: AC
  Administered 2013-12-02: 318 mg via INTRAVENOUS
  Filled 2013-12-02: qty 53

## 2013-12-02 MED ORDER — FAMOTIDINE IN NACL 20-0.9 MG/50ML-% IV SOLN
20.0000 mg | Freq: Once | INTRAVENOUS | Status: AC
  Administered 2013-12-02: 20 mg via INTRAVENOUS

## 2013-12-02 MED ORDER — DIPHENHYDRAMINE HCL 50 MG/ML IJ SOLN
50.0000 mg | Freq: Once | INTRAMUSCULAR | Status: AC
  Administered 2013-12-02: 50 mg via INTRAVENOUS

## 2013-12-02 MED ORDER — SODIUM CHLORIDE 0.9 % IV SOLN
690.0000 mg | Freq: Once | INTRAVENOUS | Status: AC
  Administered 2013-12-02: 690 mg via INTRAVENOUS
  Filled 2013-12-02: qty 69

## 2013-12-02 MED ORDER — SODIUM CHLORIDE 0.9 % IV SOLN
15.0000 mg/kg | Freq: Once | INTRAVENOUS | Status: AC
  Administered 2013-12-02: 1225 mg via INTRAVENOUS
  Filled 2013-12-02: qty 49

## 2013-12-02 MED ORDER — SODIUM CHLORIDE 0.9 % IV SOLN
Freq: Once | INTRAVENOUS | Status: AC
  Administered 2013-12-02: 18:00:00 via INTRAVENOUS

## 2013-12-02 MED ORDER — DEXAMETHASONE SODIUM PHOSPHATE 20 MG/5ML IJ SOLN
12.0000 mg | Freq: Once | INTRAMUSCULAR | Status: AC
  Administered 2013-12-02: 12 mg via INTRAVENOUS

## 2013-12-02 NOTE — Telephone Encounter (Signed)
Gave pt appt f or lab,md and chemo for July and August 2015

## 2013-12-02 NOTE — Progress Notes (Addendum)
[place] OFFICE PROGRESS NOTE   Diagnosis:  Non-small cell lung cancer.  INTERVAL HISTORY:   Ms. Schirmer returns as scheduled. She completed cycle 2 Taxol/carboplatin and Avastin on 10/29/2013. Cycle 3 was held on 11/19/2013 due to neutropenia.  She notes improvement in the neuropathy symptoms in her hands and feet but she continues to feel like she is "walking on something" when barefoot. She has persistent numbness in the fingertips. No numbness or tingling in the feet. She continues to note some difficulty opening jars. She is able to button her clothes without difficulty. The redness over the palms and soles has improved. No nausea/vomiting. Bowels moving. She reports a good appetite over the past week. No shortness of breath or chest pain. No leg swelling or calf pain. She denies any bleeding.  Objective:  Vital signs in last 24 hours:  Blood pressure 150/72, pulse 88, temperature 97 F (36.1 C), temperature source Oral, resp. rate 20, height 5' 10"  (1.778 m), weight 172 lb (78.019 kg).    HEENT: No thrush or ulcerations. Resp: Lungs clear. Cardio: Regular cardiac rhythm. GI: Abdomen soft and nontender. No hepatomegaly. Vascular: No leg edema. Calves nontender. Neuro: Vibratory sense moderately decreased over the fingertips per tuning fork exam.  Skin: Mild erythema over the soles. No palm erythema. No skin breakdown.   Port-A-Cath site without erythema.   Lab Results:  Lab Results  Component Value Date   WBC 6.9 12/02/2013   HGB 12.0 12/02/2013   HCT 36.3 12/02/2013   MCV 90.3 12/02/2013   PLT 216 12/02/2013   NEUTROABS 5.0 12/02/2013    Imaging:  No results found.  Medications: I have reviewed the patient's current medications.  Assessment/Plan: 1. Stage IV adenocarcinoma of the lung, dominant left upper lobe mass and spiculated right upper lobe mass Status post a biopsy of a left hepatic mass confirming metastatic adenocarcinoma consistent with a  lung primary, ALK mutation negative, EGFR amplification suboptimal-repeat testing recommend  Staging CT scans 09/27/2013 confirmed a dominant left upper lung mass, a smaller right upper lung mass, left hepatic mass, and left external iliac node.  Cycle 1 Taxol/carboplatin and Avastin per the CTSU Y8144 study 10/09/2013.  Cycle 2 Taxol/carboplatin and Avastin 10/29/2013.  Restaging CT evaluation 11/14/2013 with improvement in the lungs and liver. Left external iliac lymph node was stable. 2. C. difficile colitis March 2015 3. Hyponatremia/seizure on hospital admission 08/29/2013 4. Chronic right hip "bursitis". 5. Nausea and vomiting following cycle 1 Taxol/carboplatin, the anti-emetic regimenwas adjusted with cycle 2. 6. Severe neutropenia following cycle 1 Taxol/carboplatin-Neulasta added with cycle 2. 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 9. Anemia. Likely secondary to chemotherapy. Improved.   Disposition: Ms. Rader appears stable. She has completed 2 cycles of Taxol/carboplatin and Avastin. Cycle 3 was held on 11/19/2013. Plan to proceed with cycle 3 today as scheduled. She will receive Neulasta on 12/03/2013.   Symptoms of hand-foot syndrome and neuropathy are better. The Taxol has been dose reduced per study guidelines due to hand foot syndrome and neuropathy following cycle 2.  She will return for a followup visit and cycle 4 on 12/23/2013. She will contact the office in the interim with any problems.  Patient seen with Dr. Benay Spice.    Ned Card ANP/GNP-BC   12/02/2013  11:53 AM  This was a shared visit with Ned Card. The hand/foot skin changes have improved. The neuropathy symptoms are better. The plan is to proceed with cycle 3 Taxol/carboplatin  today. Chemotherapy was dose reduced per protocol.  Julieanne Manson, M.D.

## 2013-12-02 NOTE — Patient Instructions (Signed)
[facility] Discharge Instructions for Patients Receiving Chemotherapy  Today you received the following chemotherapy agents: Taxol, Carboplatin, Avastin  To help prevent nausea and vomiting after your treatment, we encourage you to take your nausea medication as prescribed by your physician.    If you develop nausea and vomiting that is not controlled by your nausea medication, call the clinic.   BELOW ARE SYMPTOMS THAT SHOULD BE REPORTED IMMEDIATELY:  *FEVER GREATER THAN 100.5 F  *CHILLS WITH OR WITHOUT FEVER  NAUSEA AND VOMITING THAT IS NOT CONTROLLED WITH YOUR NAUSEA MEDICATION  *UNUSUAL SHORTNESS OF BREATH  *UNUSUAL BRUISING OR BLEEDING  TENDERNESS IN MOUTH AND THROAT WITH OR WITHOUT PRESENCE OF ULCERS  *URINARY PROBLEMS  *BOWEL PROBLEMS  UNUSUAL RASH Items with * indicate a potential emergency and should be followed up as soon as possible.  Feel free to call the clinic you have any questions or concerns. The clinic phone number is [phone].

## 2013-12-02 NOTE — Telephone Encounter (Signed)
Per POF scheduled appt.

## 2013-12-03 ENCOUNTER — Ambulatory Visit (HOSPITAL_BASED_OUTPATIENT_CLINIC_OR_DEPARTMENT_OTHER): Payer: BC Managed Care – PPO

## 2013-12-03 VITALS — BP 141/71 | HR 96 | Temp 98.4°F

## 2013-12-03 DIAGNOSIS — C3490 Malignant neoplasm of unspecified part of unspecified bronchus or lung: Secondary | ICD-10-CM

## 2013-12-03 DIAGNOSIS — D709 Neutropenia, unspecified: Secondary | ICD-10-CM

## 2013-12-03 MED ORDER — PEGFILGRASTIM INJECTION 6 MG/0.6ML
6.0000 mg | Freq: Once | SUBCUTANEOUS | Status: AC
  Administered 2013-12-03: 6 mg via SUBCUTANEOUS
  Filled 2013-12-03: qty 0.6

## 2013-12-09 ENCOUNTER — Ambulatory Visit: Payer: BC Managed Care – PPO | Admitting: Oncology

## 2013-12-09 ENCOUNTER — Ambulatory Visit: Payer: BC Managed Care – PPO

## 2013-12-09 ENCOUNTER — Other Ambulatory Visit: Payer: BC Managed Care – PPO

## 2013-12-10 ENCOUNTER — Ambulatory Visit: Payer: BC Managed Care – PPO

## 2013-12-12 ENCOUNTER — Other Ambulatory Visit: Payer: Self-pay | Admitting: *Deleted

## 2013-12-12 ENCOUNTER — Other Ambulatory Visit (HOSPITAL_BASED_OUTPATIENT_CLINIC_OR_DEPARTMENT_OTHER): Payer: BC Managed Care – PPO

## 2013-12-12 ENCOUNTER — Telehealth: Payer: Self-pay | Admitting: *Deleted

## 2013-12-12 DIAGNOSIS — C3491 Malignant neoplasm of unspecified part of right bronchus or lung: Secondary | ICD-10-CM

## 2013-12-12 DIAGNOSIS — C787 Secondary malignant neoplasm of liver and intrahepatic bile duct: Secondary | ICD-10-CM

## 2013-12-12 DIAGNOSIS — D702 Other drug-induced agranulocytosis: Secondary | ICD-10-CM

## 2013-12-12 DIAGNOSIS — C3490 Malignant neoplasm of unspecified part of unspecified bronchus or lung: Secondary | ICD-10-CM

## 2013-12-12 DIAGNOSIS — C3410 Malignant neoplasm of upper lobe, unspecified bronchus or lung: Secondary | ICD-10-CM

## 2013-12-12 DIAGNOSIS — D649 Anemia, unspecified: Secondary | ICD-10-CM

## 2013-12-12 LAB — URINALYSIS, MICROSCOPIC - CHCC
Bilirubin (Urine): NEGATIVE
GLUCOSE UR CHCC: NEGATIVE mg/dL
Ketones: NEGATIVE mg/dL
NITRITE: POSITIVE
Protein: 30 mg/dL
Specific Gravity, Urine: 1.01 (ref 1.003–1.035)
UROBILINOGEN UR: 0.2 mg/dL (ref 0.2–1)
pH: 6.5 (ref 4.6–8.0)

## 2013-12-12 LAB — CBC WITH DIFFERENTIAL/PLATELET
BASO%: 0.3 % (ref 0.0–2.0)
Basophils Absolute: 0 10e3/uL (ref 0.0–0.1)
EOS%: 0.3 % (ref 0.0–7.0)
Eosinophils Absolute: 0 10e3/uL (ref 0.0–0.5)
HCT: 29.3 % — ABNORMAL LOW (ref 34.8–46.6)
HGB: 9.8 g/dL — ABNORMAL LOW (ref 11.6–15.9)
LYMPH%: 8.9 % — ABNORMAL LOW (ref 14.0–49.7)
MCH: 30.1 pg (ref 25.1–34.0)
MCHC: 33.3 g/dL (ref 31.5–36.0)
MCV: 90.2 fL (ref 79.5–101.0)
MONO#: 1 10e3/uL — ABNORMAL HIGH (ref 0.1–0.9)
MONO%: 11.7 % (ref 0.0–14.0)
NEUT#: 6.9 10e3/uL — ABNORMAL HIGH (ref 1.5–6.5)
NEUT%: 78.8 % — ABNORMAL HIGH (ref 38.4–76.8)
Platelets: 123 10e3/uL — ABNORMAL LOW (ref 145–400)
RBC: 3.25 10e6/uL — ABNORMAL LOW (ref 3.70–5.45)
RDW: 15.7 % — ABNORMAL HIGH (ref 11.2–14.5)
WBC: 8.8 10e3/uL (ref 3.9–10.3)
lymph#: 0.8 10e3/uL — ABNORMAL LOW (ref 0.9–3.3)

## 2013-12-12 MED ORDER — MAGIC MOUTHWASH: 5.0000 mL | Freq: Four times a day (QID) | ORAL | Status: DC | PRN

## 2013-12-12 MED ORDER — CIPROFLOXACIN HCL 500 MG PO TABS: 500.0000 mg | ORAL_TABLET | Freq: Two times a day (BID) | ORAL | Status: DC

## 2013-12-12 MED ORDER — MAGIC MOUTHWASH W/LIDOCAINE: 5.0000 mL | Freq: Four times a day (QID) | ORAL | Status: DC | PRN

## 2013-12-12 NOTE — Telephone Encounter (Signed)
Made patient aware that she most likely has UTI-Cipro called in to take bid X 5 days and to push fluids. Also made her aware we had to make adjustment in her MMW, so they can fill it now, but she can't swallow this preparation. Rinse and gargle, then spit. Suggested trying baking soda warm water rinses and Chlorseptic spray/lozenge. Regardless, call for fever or dyspnea.

## 2013-12-12 NOTE — Telephone Encounter (Signed)
Per Dr. Benay Spice : Labs as ordered and try MMW qid prn. Use baking soda/water rinses and gargles. Push po fluids. Call for high fever or chills.

## 2013-12-12 NOTE — Telephone Encounter (Signed)
   Provider input needed: Malaise, mouth tender,sore throat,dysuria   Reason for call: Mouth,throat sore,malaise,dysuria  Constitutional: positive for malaise Tongue and mouth sore and burn. Throat sore and hurts to cough. Burning with urination     ALLERGIES:  is allergic to codeine and prednisolone.  Patient last received chemotherapy/ treatment on 12/02/13-Avastin, Taxol, Carbo #3 with Neulasta  Patient was last seen in the office on 12/02/13  Next appt is 12/23/13  Is patient having fevers greater than 100.5?  no, temp is normal   Is patient having uncontrolled pain, or new pain? yes, mouth,throat,burns to void   Is patient having new back pain that changes with position (worsens or eases when laying down?)  no   Is patient able to eat and drink? yes    Is patient able to pass stool without difficulty?   yes     Is patient having uncontrolled nausea?  no     Summary Based on the above information advised patient to  Come in for u/a, culture and cbc. Will call in magic mouthwash for prn use.   Tania Ade  12/12/2013, 3:27 PM   Background Info  [facility]   DOB: 1950/12/21   MR#: [id number]   CSN#   [id number] 12/12/2013

## 2013-12-14 LAB — URINE CULTURE

## 2013-12-15 ENCOUNTER — Emergency Department (HOSPITAL_COMMUNITY)
Admission: EM | Admit: 2013-12-15 | Discharge: 2013-12-15 | Disposition: A | Discharge status: Home / Self Care | Payer: BC Managed Care – PPO | Attending: Emergency Medicine | Admitting: Emergency Medicine

## 2013-12-15 ENCOUNTER — Emergency Department (HOSPITAL_COMMUNITY): Payer: BC Managed Care – PPO

## 2013-12-15 ENCOUNTER — Encounter (HOSPITAL_COMMUNITY): Payer: Self-pay | Admitting: Emergency Medicine

## 2013-12-15 DIAGNOSIS — E860 Dehydration: Secondary | ICD-10-CM

## 2013-12-15 DIAGNOSIS — Z792 Long term (current) use of antibiotics: Secondary | ICD-10-CM | POA: Insufficient documentation

## 2013-12-15 DIAGNOSIS — Z87891 Personal history of nicotine dependence: Secondary | ICD-10-CM | POA: Insufficient documentation

## 2013-12-15 DIAGNOSIS — Z7982 Long term (current) use of aspirin: Secondary | ICD-10-CM | POA: Insufficient documentation

## 2013-12-15 DIAGNOSIS — R498 Other voice and resonance disorders: Secondary | ICD-10-CM | POA: Insufficient documentation

## 2013-12-15 DIAGNOSIS — E559 Vitamin D deficiency, unspecified: Secondary | ICD-10-CM | POA: Insufficient documentation

## 2013-12-15 DIAGNOSIS — Z79899 Other long term (current) drug therapy: Secondary | ICD-10-CM | POA: Insufficient documentation

## 2013-12-15 DIAGNOSIS — I10 Essential (primary) hypertension: Secondary | ICD-10-CM | POA: Insufficient documentation

## 2013-12-15 DIAGNOSIS — R059 Cough, unspecified: Secondary | ICD-10-CM

## 2013-12-15 DIAGNOSIS — R05 Cough: Secondary | ICD-10-CM | POA: Insufficient documentation

## 2013-12-15 DIAGNOSIS — C3490 Malignant neoplasm of unspecified part of unspecified bronchus or lung: Secondary | ICD-10-CM | POA: Insufficient documentation

## 2013-12-15 DIAGNOSIS — R490 Dysphonia: Secondary | ICD-10-CM

## 2013-12-15 DIAGNOSIS — C3491 Malignant neoplasm of unspecified part of right bronchus or lung: Secondary | ICD-10-CM

## 2013-12-15 DIAGNOSIS — F411 Generalized anxiety disorder: Secondary | ICD-10-CM | POA: Insufficient documentation

## 2013-12-15 LAB — CBC WITH DIFFERENTIAL/PLATELET
BASOS PCT: 0 % (ref 0–1)
Basophils Absolute: 0 10*3/uL (ref 0.0–0.1)
EOS ABS: 0 10*3/uL (ref 0.0–0.7)
Eosinophils Relative: 0 % (ref 0–5)
HCT: 25 % — ABNORMAL LOW (ref 36.0–46.0)
HEMOGLOBIN: 8.5 g/dL — AB (ref 12.0–15.0)
Lymphocytes Relative: 11 % — ABNORMAL LOW (ref 12–46)
Lymphs Abs: 1.1 10*3/uL (ref 0.7–4.0)
MCH: 29.8 pg (ref 26.0–34.0)
MCHC: 34 g/dL (ref 30.0–36.0)
MCV: 87.7 fL (ref 78.0–100.0)
MONO ABS: 0.9 10*3/uL (ref 0.1–1.0)
MONOS PCT: 9 % (ref 3–12)
Neutro Abs: 8 10*3/uL — ABNORMAL HIGH (ref 1.7–7.7)
Neutrophils Relative %: 80 % — ABNORMAL HIGH (ref 43–77)
Platelets: 139 10*3/uL — ABNORMAL LOW (ref 150–400)
RBC: 2.85 MIL/uL — ABNORMAL LOW (ref 3.87–5.11)
RDW: 16 % — ABNORMAL HIGH (ref 11.5–15.5)
WBC: 10 10*3/uL (ref 4.0–10.5)

## 2013-12-15 LAB — COMPREHENSIVE METABOLIC PANEL
ALBUMIN: 2.2 g/dL — AB (ref 3.5–5.2)
ALT: 14 U/L (ref 0–35)
ANION GAP: 16 — AB (ref 5–15)
AST: 15 U/L (ref 0–37)
Alkaline Phosphatase: 142 U/L — ABNORMAL HIGH (ref 39–117)
BILIRUBIN TOTAL: 0.5 mg/dL (ref 0.3–1.2)
BUN: 12 mg/dL (ref 6–23)
CO2: 22 mEq/L (ref 19–32)
CREATININE: 0.55 mg/dL (ref 0.50–1.10)
Calcium: 9.3 mg/dL (ref 8.4–10.5)
Chloride: 92 mEq/L — ABNORMAL LOW (ref 96–112)
GFR calc Af Amer: >90 mL/min (ref >90)
GFR calc non Af Amer: >90 mL/min (ref >90)
Glucose, Bld: 101 mg/dL — ABNORMAL HIGH (ref 70–99)
Potassium: 3.7 mEq/L (ref 3.7–5.3)
Sodium: 130 mEq/L — ABNORMAL LOW (ref 137–147)
Total Protein: 7.1 g/dL (ref 6.0–8.3)

## 2013-12-15 LAB — I-STAT CG4 LACTIC ACID, ED: Lactic Acid, Venous: 0.57 mmol/L (ref 0.5–2.2)

## 2013-12-15 LAB — URINALYSIS, ROUTINE W REFLEX MICROSCOPIC
Bilirubin Urine: NEGATIVE
Glucose, UA: NEGATIVE mg/dL
Hgb urine dipstick: NEGATIVE
KETONES UR: NEGATIVE mg/dL
LEUKOCYTES UA: NEGATIVE
NITRITE: NEGATIVE
PROTEIN: NEGATIVE mg/dL
Specific Gravity, Urine: 1.016 (ref 1.005–1.030)
UROBILINOGEN UA: 1 mg/dL (ref 0.0–1.0)
pH: 6 (ref 5.0–8.0)

## 2013-12-15 MED ORDER — HEPARIN SOD (PORK) LOCK FLUSH 100 UNIT/ML IV SOLN
500.0000 [IU] | Freq: Once | INTRAVENOUS | Status: AC
  Administered 2013-12-15: 500 [IU]
  Filled 2013-12-15: qty 5

## 2013-12-15 MED ORDER — SODIUM CHLORIDE 0.9 % IV BOLUS (SEPSIS)
1000.0000 mL | Freq: Once | INTRAVENOUS | Status: AC
  Administered 2013-12-15: 1000 mL via INTRAVENOUS

## 2013-12-15 MED ORDER — ALBUTEROL SULFATE (2.5 MG/3ML) 0.083% IN NEBU
5.0000 mg | INHALATION_SOLUTION | Freq: Once | RESPIRATORY_TRACT | Status: AC
  Administered 2013-12-15: 5 mg via RESPIRATORY_TRACT
  Filled 2013-12-15: qty 6

## 2013-12-15 MED ORDER — HYDROCODONE-ACETAMINOPHEN 7.5-325 MG/15ML PO SOLN: 15.0000 mL | Freq: Four times a day (QID) | ORAL | Status: DC | PRN

## 2013-12-15 MED ORDER — HYDROCODONE-ACETAMINOPHEN 7.5-325 MG/15ML PO SOLN
10.0000 mL | Freq: Once | ORAL | Status: AC
  Administered 2013-12-15: 10 mL via ORAL
  Filled 2013-12-15: qty 15

## 2013-12-15 NOTE — Discharge Instructions (Signed)
Return to the emergency room for any worsening or concerning symptoms including fast breathing, heart racing, confusion, vomiting.  Rest, cover your mouth when you cough and wash your hands frequently.   Push fluids: water or Gatorade, do not drink any soda, juice or caffeinated beverages.  For pain control you can take Motrin (ibuprofen) as follows: 400 mg (this is normally 2 over the counter pills) every 4 hours with food.  Take Hycet  for cough and pain control, do not drink alcohol, drive, care for children or do other critical tasks while taking Hycet      Please follow with your primary care doctor in the next 2 days for a check-up. They must obtain records for further management.   Do not hesitate to return to the Emergency Department for any new, worsening or concerning symptoms.    Dehydration, Adult Dehydration means your body does not have as much fluid as it needs. Your kidneys, brain, and heart will not work properly without the right amount of fluids and salt.  HOME CARE  Ask your doctor how to replace body fluid losses (rehydrate).  Drink enough fluids to keep your pee (urine) clear or pale yellow.  Drink small amounts of fluids often if you feel sick to your stomach (nauseous) or throw up (vomit).  Eat like you normally do.  Avoid:  Foods or drinks high in sugar.  Bubbly (carbonated) drinks.  Juice.  Very hot or cold fluids.  Drinks with caffeine.  Fatty, greasy foods.  Alcohol.  Tobacco.  Eating too much.  Gelatin desserts.  Wash your hands to avoid spreading germs (bacteria, viruses).  Only take medicine as told by your doctor.  Keep all doctor visits as told. GET HELP RIGHT AWAY IF:   You cannot drink something without throwing up.  You get worse even with treatment.  Your vomit has blood in it or looks greenish.  Your poop (stool) has blood in it or looks black and tarry.  You have not peed in 6 to 8 hours.  You pee a small amount  of very dark pee.  You have a fever.  You pass out (faint).  You have belly (abdominal) pain that gets worse or stays in one spot (localizes).  You have a rash, stiff neck, or bad headache.  You get easily annoyed, sleepy, or are hard to wake up.  You feel weak, dizzy, or very thirsty. MAKE SURE YOU:   Understand these instructions.  Will watch your condition.  Will get help right away if you are not doing well or get worse. Document Released: 03/19/2009 Document Revised: 08/15/2011 Document Reviewed: 01/10/2011 [facility] Patient Information 2015 [place], [place]. This information is not intended to replace advice given to you by your health care provider. Make sure you discuss any questions you have with your health care provider.

## 2013-12-15 NOTE — ED Notes (Signed)
Pt informed of need for urine specimen pt states she can not void at this time

## 2013-12-15 NOTE — ED Provider Notes (Signed)
Medical screening examination/treatment/procedure(s) were performed by non-physician practitioner and as supervising physician I was immediately available for consultation/collaboration.    Dorie Rank, MD 12/15/13 [phone]

## 2013-12-15 NOTE — ED Provider Notes (Signed)
CSN: [id number]     Arrival date & time 12/15/13  1644 History   First MD Initiated Contact with Patient 12/15/13 1739     Chief Complaint  Patient presents with  . Cough  . ca pt      (Consider location/radiation/quality/duration/timing/severity/associated sxs/prior Treatment) HPI  Takita Riecke is a 63 y.o. female past medical history significant for lung cancer, on third round of chemotherapy 2 weeks ago, received Neulasta shot complaining of fatigue, intermittently productive cough and hoarse voice onset 5 days ago. Patient has reduced by mouth intake. She is taking Cipro for UTI. Patient denies fever (checking temperature periodically), chills, nausea, vomiting, chest pain, shortness of breath, abdominal pain. States she is having slightly loose stools which is typical for her after chemotherapy. States that the dysuria is improving after she started taking Cipro 4 days ago. Patient has not had any history of DVT or PE, she is not taking any anticoagulation. She denies any calf pain or swelling.  Past Medical History  Diagnosis Date  . Hyperlipidemia   . Hypertension   . Prediabetes   . Vitamin D deficiency   . Anxiety   . Cancer     lung ca dx'd 08/2013   Past Surgical History  Procedure Laterality Date  . Lasik Bilateral 2002  . Carpal tunnel release Right 2000  . Abdominal hysterectomy  1999  . Cesarean section      x2   Family History  Problem Relation Age of Onset  . Heart disease Mother   . Hypertension Mother   . Heart attack Mother   . Pneumonia Father    History  Substance Use Topics  . Smoking status: Former Smoker -- 1.00 packs/day for 40 years    Types: Cigarettes    Quit date: 08/30/2013  . Smokeless tobacco: Not on file  . Alcohol Use: No   OB History   Grav Para Term Preterm Abortions TAB SAB Ect Mult Living                 Review of Systems  10 systems reviewed and found to be negative, except as noted in the HPI.   Allergies  Codeine and  Prednisolone  Home Medications   Prior to Admission medications   Medication Sig Start Date End Date Taking? Authorizing Provider  ALPRAZolam Duanne Moron) 1 MG tablet Take 1 mg by mouth at bedtime.    Yes Historical Provider, MD  Alum & Mag Hydroxide-Simeth (MAGIC MOUTHWASH W/LIDOCAINE) SOLN Take 5 mLs by mouth 4 (four) times daily as needed for mouth pain (swish for 1 minute, then spit). 12/12/13  Yes Ladell Pier, MD  amLODipine (NORVASC) 5 MG tablet Take 5 mg by mouth daily.   Yes Historical Provider, MD  aspirin 81 MG tablet Take 81 mg by mouth daily.   Yes Historical Provider, MD  Cholecalciferol (VITAMIN D) 2000 UNITS CAPS Take 4,000 Units by mouth daily.   Yes Historical Provider, MD  ciprofloxacin (CIPRO) 500 MG tablet Take 1 tablet (500 mg total) by mouth 2 (two) times daily. X 5 days 12/12/13  Yes Ladell Pier, MD  loratadine (CLARITIN) 10 MG tablet Take 10 mg by mouth daily.   Yes Historical Provider, MD  Misc Natural Products (GLUCOS-CHONDROIT-MSM COMPLEX PO) Take 30 mLs by mouth daily.   Yes Historical Provider, MD  Multiple Vitamin (MULTIVITAMIN WITH MINERALS) TABS tablet Take 1 tablet by mouth daily.   Yes Historical Provider, MD  nicotine (NICODERM CQ - DOSED IN  MG/24 HR) 7 mg/24hr patch Place 7 mg onto the skin daily.   Yes Historical Provider, MD  traMADol (ULTRAM) 50 MG tablet Take 1 tablet (50 mg total) by mouth every 6 (six) hours as needed. 12/02/13  Yes Owens Shark, NP  HYDROcodone-acetaminophen (HYCET) 7.5-325 mg/15 ml solution Take 15 mLs by mouth every 6 (six) hours as needed. 12/15/13   Amariona Rathje, PA-C   BP 110/47  Pulse 114  Temp(Src) 98.1 F (36.7 C) (Oral)  Resp 17  SpO2 99% Physical Exam  Nursing note and vitals reviewed. Constitutional: She is oriented to person, place, and time. She appears well-developed and well-nourished. No distress.  Hoarse voice  HENT:  Head: Normocephalic and atraumatic.  Mildly dry mucous membranes  Eyes: Conjunctivae and  EOM are normal.  Neck: Normal range of motion.  Cardiovascular: Regular rhythm and intact distal pulses.   Mild tachycardia  Pulmonary/Chest: Effort normal and breath sounds normal. No stridor. No respiratory distress. She has no wheezes. She has no rales. She exhibits no tenderness.  Abdominal: Soft. Bowel sounds are normal. She exhibits no distension and no mass. There is no tenderness. There is no rebound and no guarding.  Musculoskeletal: Normal range of motion. She exhibits no edema and no tenderness.  No calf asymmetry, superficial collaterals, palpable cords, edema, Homans sign negative bilaterally.    Neurological: She is alert and oriented to person, place, and time.  Psychiatric: She has a normal mood and affect.    ED Course  Procedures (including critical care time) Labs Review Labs Reviewed  CBC WITH DIFFERENTIAL - Abnormal; Notable for the following:    RBC 2.85 (*)    Hemoglobin 8.5 (*)    HCT 25.0 (*)    RDW 16.0 (*)    Platelets 139 (*)    Neutrophils Relative % 80 (*)    Neutro Abs 8.0 (*)    Lymphocytes Relative 11 (*)    All other components within normal limits  COMPREHENSIVE METABOLIC PANEL - Abnormal; Notable for the following:    Sodium 130 (*)    Chloride 92 (*)    Glucose, Bld 101 (*)    Albumin 2.2 (*)    Alkaline Phosphatase 142 (*)    Anion gap 16 (*)    All other components within normal limits  URINALYSIS, ROUTINE W REFLEX MICROSCOPIC - Abnormal; Notable for the following:    Color, Urine AMBER (*)    All other components within normal limits  I-STAT CG4 LACTIC ACID, ED    Imaging Review Dg Chest 2 View  12/15/2013   CLINICAL DATA:  Cough, fever, and weakness.  Metastatic lung cancer.  EXAM: CHEST  2 VIEW  COMPARISON:  CT scan dated 11/14/2013 and chest x-ray dated 08/31/2013  FINDINGS: Power port is in place and appears in good position. Heart size and pulmonary vascularity are normal. There is increased cavitation of the mass in the left  upper lobe. Ill-defined mass in the right upper lobe appears essentially unchanged since the prior CT scan of 11/14/2013.  There is slight new linear atelectasis at the left lung base. No acute osseous abnormality.  IMPRESSION: No significant change in the appearance of the chest since the prior CT scan of 11/14/2013 other than slight increased cavitation of the mass in the left upper lobe.   Electronically Signed   By: Rozetta Nunnery M.D.   On: 12/15/2013 17:43     EKG Interpretation None      MDM  Final diagnoses:  Dehydration  Cough  Hoarse voice quality  Malignant neoplasm of right lung, unspecified part of lung    Filed Vitals:   12/15/13 1651 12/15/13 1814 12/15/13 1840 12/15/13 2021  BP: 137/56 110/47  119/53  Pulse: 114   91  Temp: 98.1 F (36.7 C)     TempSrc: Oral     Resp: 25 17  20   SpO2: 96% 99% 99% 96%    Medications  heparin lock flush 100 unit/mL (not administered)  HYDROcodone-acetaminophen (HYCET) 7.5-325 mg/15 ml solution 10 mL (10 mLs Oral Given 12/15/13 1817)  sodium chloride 0.9 % bolus 1,000 mL (1,000 mLs Intravenous New Bag/Given 12/15/13 1817)  albuterol (PROVENTIL) (2.5 MG/3ML) 0.083% nebulizer solution 5 mg (5 mg Nebulization Given 12/15/13 1840)    Krystalynn Ridgeway is a 63 y.o. female presenting with intermittently productive cough, fatigue worsening voice onset 3 days ago. Patient is undergoing chemotherapy for lung cancer, receiving Neulasta shot. Patient saturating well on room air, she is mildly tachycardic. She appears clinically dehydrated. Chest x-ray shows no signs of infiltrate. Plan is to obtain blood work, hydrate and give nebs and cough medication. Patient does not have signs and symptoms consistent with DVT  Tachycardia has resolved after hydration. Patient reports significant subjective improvement after cough medication. Urinalysis shows improving urinary tract infection. Encouraged patient to continue with antibiotics.  Evaluation does not  show pathology that would require ongoing emergent intervention or inpatient treatment. Pt is hemodynamically stable and mentating appropriately. Discussed findings and plan with patient/guardian, who agrees with care plan. All questions answered. Return precautions discussed and outpatient follow up given.   New Prescriptions   HYDROCODONE-ACETAMINOPHEN (HYCET) 7.5-325 MG/15 ML SOLUTION    Take 15 mLs by mouth every 6 (six) hours as needed.         Monico Blitz, PA-C 12/15/13 2219

## 2013-12-15 NOTE — ED Notes (Signed)
Pt has lung cancer, received chemo 2 weeks ago. Was seen in Cancer center on 7/9 and dx with UTI, started on abx. On Friday cough started. Sometimes productive. Pt reports more increased weakness than normal after chemo. Reports pain in her left sided/back area when she coughs. 5/10 pain. Pt has lost voice as well.

## 2013-12-16 ENCOUNTER — Ambulatory Visit: Payer: BC Managed Care – PPO | Admitting: Oncology

## 2013-12-16 ENCOUNTER — Encounter: Payer: Self-pay | Admitting: Nurse Practitioner

## 2013-12-16 ENCOUNTER — Ambulatory Visit (HOSPITAL_BASED_OUTPATIENT_CLINIC_OR_DEPARTMENT_OTHER): Payer: BC Managed Care – PPO | Admitting: Nurse Practitioner

## 2013-12-16 ENCOUNTER — Other Ambulatory Visit: Payer: Self-pay | Admitting: *Deleted

## 2013-12-16 ENCOUNTER — Other Ambulatory Visit: Payer: BC Managed Care – PPO

## 2013-12-16 ENCOUNTER — Ambulatory Visit: Payer: BC Managed Care – PPO

## 2013-12-16 ENCOUNTER — Telehealth: Payer: Self-pay | Admitting: *Deleted

## 2013-12-16 ENCOUNTER — Ambulatory Visit (HOSPITAL_BASED_OUTPATIENT_CLINIC_OR_DEPARTMENT_OTHER): Payer: BC Managed Care – PPO

## 2013-12-16 VITALS — BP 117/55 | HR 86 | Temp 97.9°F | Resp 18

## 2013-12-16 DIAGNOSIS — C3490 Malignant neoplasm of unspecified part of unspecified bronchus or lung: Secondary | ICD-10-CM

## 2013-12-16 DIAGNOSIS — E860 Dehydration: Secondary | ICD-10-CM

## 2013-12-16 DIAGNOSIS — R059 Cough, unspecified: Secondary | ICD-10-CM

## 2013-12-16 DIAGNOSIS — K1230 Oral mucositis (ulcerative), unspecified: Secondary | ICD-10-CM

## 2013-12-16 DIAGNOSIS — K1379 Other lesions of oral mucosa: Secondary | ICD-10-CM

## 2013-12-16 DIAGNOSIS — K121 Other forms of stomatitis: Secondary | ICD-10-CM

## 2013-12-16 DIAGNOSIS — C3410 Malignant neoplasm of upper lobe, unspecified bronchus or lung: Secondary | ICD-10-CM

## 2013-12-16 DIAGNOSIS — R05 Cough: Secondary | ICD-10-CM

## 2013-12-16 DIAGNOSIS — C3492 Malignant neoplasm of unspecified part of left bronchus or lung: Secondary | ICD-10-CM

## 2013-12-16 DIAGNOSIS — N390 Urinary tract infection, site not specified: Secondary | ICD-10-CM

## 2013-12-16 MED ORDER — SODIUM CHLORIDE 0.9 % IJ SOLN
10.0000 mL | INTRAMUSCULAR | Status: DC | PRN
  Administered 2013-12-16: 10 mL via INTRAVENOUS
  Filled 2013-12-16: qty 10

## 2013-12-16 MED ORDER — HYDROCODONE-ACETAMINOPHEN 7.5-325 MG/15ML PO SOLN: 15.0000 mL | Freq: Four times a day (QID) | ORAL | Status: DC | PRN

## 2013-12-16 MED ORDER — SODIUM CHLORIDE 0.9 % IV SOLN
Freq: Once | INTRAVENOUS | Status: AC
  Administered 2013-12-16: 16:00:00 via INTRAVENOUS

## 2013-12-16 MED ORDER — HEPARIN SOD (PORK) LOCK FLUSH 100 UNIT/ML IV SOLN
500.0000 [IU] | Freq: Once | INTRAVENOUS | Status: AC
  Administered 2013-12-16: 500 [IU] via INTRAVENOUS
  Filled 2013-12-16: qty 5

## 2013-12-16 MED ORDER — MAGIC MOUTHWASH W/LIDOCAINE: 5.0000 mL | Freq: Four times a day (QID) | ORAL | Status: DC | PRN

## 2013-12-16 MED ORDER — SUCRALFATE 1 GM/10ML PO SUSP: 1.0000 g | Freq: Three times a day (TID) | ORAL | Status: DC

## 2013-12-16 NOTE — Telephone Encounter (Signed)
   Provider input needed: Weakness, Dehydration, Hoarse   Reason for call: Weakness, Dehydration, Hoarse-cough  Constitutional: positive for weakness, lightheaded Respiratory: positive for cough and hoarseness of voice Only has taken 24 ounces fluids in past 24 hours, throat sore   ALLERGIES:  is allergic to codeine and prednisolone.  Patient last received chemotherapy/ treatment on Avastin, Taxol, Carboplatin 12/02/13 with Neulasta 6/30  Patient was last seen in the office on 12/02/13. Was seen in ER 12/15/13 and feeling no better today  Next appt is 12/23/13  Is patient having fevers greater than 100.5?  no   Is patient having uncontrolled pain, or new pain? no   Is patient having new back pain that changes with position (worsens or eases when laying down?)  no   Is patient able to eat and drink? yes, minimal intake    Is patient able to pass stool without difficulty?   yes     Is patient having uncontrolled nausea?  no   Noted Hgb on 7/12 had decreased to 8.5 and then she received IV fluids. Patient reports her urinary symptoms are improved with the Cipro that was started last week. Daughter is expressing extreme concern about her condition and is requesting patient be seen.    Summary Based on the above information advised family to await return call    Tania Ade  12/16/2013, 1:51 PM   Background Info  Christina Hale   DOB: [birth year]   MR#: [id number]   CSN#   [id number] 12/16/2013

## 2013-12-16 NOTE — Patient Instructions (Signed)
Dehydration, Adult Dehydration is when you lose more fluids from the body than you take in. Vital organs like the kidneys, brain, and heart cannot function without a proper amount of fluids and salt. Any loss of fluids from the body can cause dehydration.  CAUSES   Vomiting.  Diarrhea.  Excessive sweating.  Excessive urine output.  Fever. SYMPTOMS  Mild dehydration  Thirst.  Dry lips.  Slightly dry mouth. Moderate dehydration  Very dry mouth.  Sunken eyes.  Skin does not bounce back quickly when lightly pinched and released.  Dark urine and decreased urine production.  Decreased tear production.  Headache. Severe dehydration  Very dry mouth.  Extreme thirst.  Rapid, weak pulse (more than 100 beats per minute at rest).  Cold hands and feet.  Not able to sweat in spite of heat and temperature.  Rapid breathing.  Blue lips.  Confusion and lethargy.  Difficulty being awakened.  Minimal urine production.  No tears. DIAGNOSIS  Your caregiver will diagnose dehydration based on your symptoms and your exam. Blood and urine tests will help confirm the diagnosis. The diagnostic evaluation should also identify the cause of dehydration. TREATMENT  Treatment of mild or moderate dehydration can often be done at home by increasing the amount of fluids that you drink. It is best to drink small amounts of fluid more often. Drinking too much at one time can make vomiting worse. Refer to the home care instructions below. Severe dehydration needs to be treated at the hospital where you will probably be given intravenous (IV) fluids that contain water and electrolytes. HOME CARE INSTRUCTIONS   Ask your caregiver about specific rehydration instructions.  Drink enough fluids to keep your urine clear or pale yellow.  Drink small amounts frequently if you have nausea and vomiting.  Eat as you normally do.  Avoid:  Foods or drinks high in sugar.  Carbonated  drinks.  Juice.  Extremely hot or cold fluids.  Drinks with caffeine.  Fatty, greasy foods.  Alcohol.  Tobacco.  Overeating.  Gelatin desserts.  Wash your hands well to avoid spreading bacteria and viruses.  Only take over-the-counter or prescription medicines for pain, discomfort, or fever as directed by your caregiver.  Ask your caregiver if you should continue all prescribed and over-the-counter medicines.  Keep all follow-up appointments with your caregiver. SEEK MEDICAL CARE IF:  You have abdominal pain and it increases or stays in one area (localizes).  You have a rash, stiff neck, or severe headache.  You are irritable, sleepy, or difficult to awaken.  You are weak, dizzy, or extremely thirsty. SEEK IMMEDIATE MEDICAL CARE IF:   You are unable to keep fluids down or you get worse despite treatment.  You have frequent episodes of vomiting or diarrhea.  You have blood or green matter (bile) in your vomit.  You have blood in your stool or your stool looks black and tarry.  You have not urinated in 6 to 8 hours, or you have only urinated a small amount of very dark urine.  You have a fever.  You faint. MAKE SURE YOU:   Understand these instructions.  Will watch your condition.  Will get help right away if you are not doing well or get worse. Document Released: 05/23/2005 Document Revised: 08/15/2011 Document Reviewed: 01/10/2011 Exit[employer] Patient Information 2015 [employer]. This information is not intended to replace advice given to you by your health care provider. Make sure you discuss any questions you have with your health care   provider.  

## 2013-12-16 NOTE — Assessment & Plan Note (Signed)
Pt continues to take Cipro as previously prescribed. UA obtained yesterday reveals that infection has resolved.

## 2013-12-16 NOTE — Assessment & Plan Note (Signed)
No evidence of mucositis or thrush on exam today. Patient managing all secretions and drinking soda with no difficulty. Prescribed both Magic Mouthwash and carafate to try for possible espohagitis pain. Also refilled hydrocodone syrup per pt request.

## 2013-12-16 NOTE — Assessment & Plan Note (Signed)
Patient is experiencing worsening cough and hoarseness- which may be a chemo side effect; or could possibly be cancer related. CXR obtained in ED last nite revealed no pneumonia. CXR did reveal some slight increased cavitation of mass in left upper lobe however.

## 2013-12-16 NOTE — Progress Notes (Signed)
[place]   Chief Complaint  Patient presents with  . Cough  . Mucositis  . Dehydration  . Urinary Tract Infection    HPI: Christina Hale 63 y.o. female diagnosed with lung cancer. Last received avastin/carboplatin/taxol 12/02/2013.  Has developed worsening esophageal pain and worsening hoarseness over last week. Has had cough that is progressive as well; but states that cough is nonproductive.   Also developed an UTI; and initiated Cipro BID on Thursday 12/12/13. States that all UTI symptoms have now cleared.  States that she has had little appetite; and taken in little fluids since having some discomfort in esophageal area. Denies any specific oral lesions. Denies any recent fever or chills.   Patient presented to ED last night with same complaints.  Labs were essentially normal; and CXR revealed only slight changes no pneumonia noted. UA obtained revealed that infection had cleared.  Patient was given a liter of NS IV fluid as well.   CURRENT THERAPY: Upcoming Treatment Dates - LUNG Research ECOG [phone] Induction ARM 1 Days with orders from any treatment category:  12/23/2013      [facility] COMMUNICATION      fosaprepitant (EMEND) 150 mg in sodium chloride 0.9 % 145 mL IVPB      palonosetron (ALOXI) injection 0.25 mg      diphenhydrAMINE (BENADRYL) injection 50 mg      Dexamethasone Sodium Phosphate (DECADRON) injection 12 mg      famotidine (PEPCID) IVPB 20 mg      PACLitaxel (TAXOL) 318 mg in dextrose 5 % 500 mL chemo infusion (> 80mg /m2)      CARBOplatin (PARAPLATIN) 710 mg in sodium chloride 0.9 % 250 mL chemo infusion      bevacizumab (AVASTIN) 1,225 mg in sodium chloride 0.9 % 100 mL chemo infusion      0.9 %  sodium chloride infusion      sodium chloride 0.9 % injection 10 mL      heparin lock flush 100 unit/mL      heparin lock flush 100 unit/mL      alteplase (CATHFLO ACTIVASE) injection 2 mg      sodium chloride 0.9 % injection 3 mL      Cold Pack 1  packet      diphenhydrAMINE (BENADRYL) injection 25 mg      famotidine (PEPCID) IVPB 20 mg      0.9 %  sodium chloride infusion      methylPREDNISolone sodium succinate (SOLU-MEDROL) 125 mg/2 mL injection 125 mg      EPINEPHrine (ADRENALIN) 0.1 MG/ML injection 0.25 mg      EPINEPHrine (ADRENALIN) 0.1 MG/ML injection 0.25 mg      EPINEPHrine (ADRENALIN) injection 0.5 mg      EPINEPHrine (ADRENALIN) injection 0.5 mg      diphenhydrAMINE (BENADRYL) injection 50 mg      albuterol (PROVENTIL) (2.5 MG/3ML) 0.083% nebulizer solution 2.5 mg      0.9 %  sodium chloride infusion      RESEARCH TREATMENT CONDITIONS 12/24/2013      pegfilgrastim (NEULASTA) injection 6 mg    Past Medical History  Diagnosis Date  . Hyperlipidemia   . Hypertension   . Prediabetes   . Vitamin D deficiency   . Anxiety   . Cancer     lung ca dx'd 08/2013    Past Surgical History  Procedure Laterality Date  . Lasik Bilateral 2002  . Carpal tunnel release Right 2000  . Abdominal hysterectomy  1999  . Cesarean section      x2    has Hyperlipidemia; Hypertension; Prediabetes; Vitamin D deficiency; Anxiety; Hyponatremia; Lung mass; Seizures; Smoker; Enteritis due to Clostridium difficile; Liver mass; Lung cancer; Cough; Mucositis; Urinary tract infection; and Dehydration on her problem list.     is allergic to codeine and prednisolone.    Medication List       This list is accurate as of: 12/16/13  5:20 PM.  Always use your most recent med list.               ALPRAZolam 1 MG tablet  Commonly known as:  XANAX  Take 1 mg by mouth at bedtime.     amLODipine 5 MG tablet  Commonly known as:  NORVASC  Take 5 mg by mouth daily.     aspirin 81 MG tablet  Take 81 mg by mouth daily.     ciprofloxacin 500 MG tablet  Commonly known as:  CIPRO  Take 1 tablet (500 mg total) by mouth 2 (two) times daily. X 5 days     GLUCOS-CHONDROIT-MSM COMPLEX PO  Take 30 mLs by mouth daily.     HYDROcodone-acetaminophen  7.5-325 mg/15 ml solution  Commonly known as:  HYCET  Take 15 mLs by mouth every 6 (six) hours as needed.     loratadine 10 MG tablet  Commonly known as:  CLARITIN  Take 10 mg by mouth daily.     magic mouthwash w/lidocaine Soln  Take 5 mLs by mouth 4 (four) times daily as needed for mouth pain (swish for 1 minute, then spit).     multivitamin with minerals Tabs tablet  Take 1 tablet by mouth daily.     nicotine 7 mg/24hr patch  Commonly known as:  NICODERM CQ - dosed in mg/24 hr  Place 7 mg onto the skin daily.     sucralfate 1 GM/10ML suspension  Commonly known as:  CARAFATE  Take 10 mLs (1 g total) by mouth 4 (four) times daily -  with meals and at bedtime.     traMADol 50 MG tablet  Commonly known as:  ULTRAM  Take 1 tablet (50 mg total) by mouth every 6 (six) hours as needed.     Vitamin D 2000 UNITS Caps  Take 4,000 Units by mouth daily.          PHYSICAL EXAMINATION  Vitals; BP 130/51, HR 98, temp 98.7, O2 sats 100%  GENERAL:alert, healthy, no distress, well nourished and well developed SKIN: skin color, texture, turgor are normal, no rashes or significant lesions.  Slight peeling of bilat feet; but does appear that hand/foot syndrome symptoms is improving.  HEAD: Normocephalic, No masses, lesions, tenderness or abnormalities EYES: PERRLA, Conjunctiva are pink and non-injected OROPHARYNX:no exudate, no erythema and lips, buccal mucosa, and tongue normal  NECK: supple, no adenopathy, no bruits, no JVD LYMPH:  no palpable lymphadenopathy BREAST:not examined LUNGS: negative findings:  no chest deformities noted, normal respiratory rate and rhythm, lungs clear to auscultation.  Patient noted to occ have congested cough; but no distress.  HEART: regular rate & rhythm, no murmurs and no gallops ABDOMEN:abdomen soft, non-tender, normal bowel sounds and no masses or organomegaly BACK: No CVA tenderness, Range of motion is normal EXTREMITIES:no edema, no clubbing, no  cyanosis  NEURO: alert & oriented x 3 with fluent speech, gait normal  LABORATORY DATA:. CBC  Lab Results  Component Value Date   WBC 10.0 12/15/2013   RBC 2.85* 12/15/2013  HGB 8.5* 12/15/2013   HCT 25.0* 12/15/2013   PLT 139* 12/15/2013   MCV 87.7 12/15/2013   MCH 29.8 12/15/2013   MCHC 34.0 12/15/2013   RDW 16.0* 12/15/2013   LYMPHSABS 1.1 12/15/2013   MONOABS 0.9 12/15/2013   EOSABS 0.0 12/15/2013   BASOSABS 0.0 12/15/2013     CMET  Lab Results  Component Value Date   NA 130* 12/15/2013   K 3.7 12/15/2013   CL 92* 12/15/2013   CO2 22 12/15/2013   GLUCOSE 101* 12/15/2013   BUN 12 12/15/2013   CREATININE 0.55 12/15/2013   CALCIUM 9.3 12/15/2013   PROT 7.1 12/15/2013   ALBUMIN 2.2* 12/15/2013   AST 15 12/15/2013   ALT 14 12/15/2013   ALKPHOS 142* 12/15/2013   BILITOT 0.5 12/15/2013   GFRNONAA >90 12/15/2013   GFRAA >90 12/15/2013   UA obtained 12/15/2013 in ED reveals that infection has resolved.   RADIOGRAPHIC STUDIES:  DG Chest 2 View Status: Final result         PACS Images    Show images for DG Chest 2 View         Study Result    CLINICAL DATA: Cough, fever, and weakness. Metastatic lung cancer.  EXAM:  CHEST 2 VIEW  COMPARISON: CT scan dated 11/14/2013 and chest x-ray dated  08/31/2013  FINDINGS:  Power port is in place and appears in good position. Heart size and  pulmonary vascularity are normal. There is increased cavitation of  the mass in the left upper lobe. Ill-defined mass in the right upper  lobe appears essentially unchanged since the prior CT scan of  11/14/2013.  There is slight new linear atelectasis at the left lung base. No  acute osseous abnormality.  IMPRESSION:  No significant change in the appearance of the chest since the prior  CT scan of 11/14/2013 other than slight increased cavitation of the  mass in the left upper lobe.  Electronically Signed  By: Rozetta Nunnery M.D.  On: 12/15/2013 17:43    ASSESSMENT/PLAN:    Lung  cancer  Assessment & Plan Patient has received 3 cycles of carboplatin/taxol/avastin; with one dose reduction due to worsening hand/foot syndrome. Does appear that hand/foot issues are much improved now. Next due for cycle 4 of chemo 12/23/2013.    Cough  Assessment & Plan Patient is experiencing worsening cough and hoarseness- which may be a chemo side effect; or could possibly be cancer related. CXR obtained in ED last nite revealed no pneumonia. CXR did reveal some slight increased cavitation of mass in left upper lobe however.    Mucositis  Assessment & Plan No evidence of mucositis or thrush on exam today. Patient managing all secretions and drinking soda with no difficulty. Prescribed both Magic Mouthwash and carafate to try for possible espohagitis pain. Also refilled hydrocodone syrup per pt request.    Urinary tract infection  Assessment & Plan Pt continues to take Cipro as previously prescribed. UA obtained yesterday reveals that infection has resolved.    Dehydration  Assessment & Plan Chemo-related side effect; and most likely due to esophageal discomfort. Patient received 1 liter NS IV fluid hydration while in [place] today. Advised patient to call if she feels need for further IV fluid hydration this week.     Patient stated understanding of all instructions; and was in agreement with this plan of care. The patient knows to call the clinic with any problems, questions or concerns.   Total time spent with patient  was 40 minutes;  with greater than 75 percent of that time spent in face to face counseling regarding her ED visit findings from last nite, her continued symptoms,  and coordination of care and followup.  Drue Second, NP 12/16/2013

## 2013-12-16 NOTE — Telephone Encounter (Signed)
Notified daughter to bring patient to office now for IV fluids and NP, Drue Second will see her.

## 2013-12-16 NOTE — Assessment & Plan Note (Signed)
Chemo-related side effect; and most likely due to esophageal discomfort. Patient received 1 liter NS IV fluid hydration while in [place] today. Advised patient to call if she feels need for further IV fluid hydration this week.

## 2013-12-16 NOTE — Assessment & Plan Note (Signed)
Patient has received 3 cycles of carboplatin/taxol/avastin; with one dose reduction due to worsening hand/foot syndrome. Does appear that hand/foot issues are much improved now. Next due for cycle 4 of chemo 12/23/2013.

## 2013-12-17 ENCOUNTER — Ambulatory Visit: Payer: BC Managed Care – PPO

## 2013-12-19 ENCOUNTER — Other Ambulatory Visit: Payer: Self-pay | Admitting: Nurse Practitioner

## 2013-12-19 ENCOUNTER — Telehealth: Payer: Self-pay | Admitting: *Deleted

## 2013-12-19 MED ORDER — HYDROCODONE-ACETAMINOPHEN 7.5-325 MG/15ML PO SOLN: 10.0000 mL | Freq: Four times a day (QID) | ORAL | Status: DC | PRN

## 2013-12-19 NOTE — Telephone Encounter (Signed)
Husband wants her to come to clinic for more IV fluids. She is not drinking enough and not eating at all. Voice is still very hoarse. Continues to have violent coughing spells and throat painful while coughing. Does not hurt to swallow. She is taking the Hycocet and alternates with Mucinex-cough sounds moist with clear sputum. Her feet are swelling. She is getting weaker and weaker. No fever or acute pain or dyspnea.  Informed husband to take her to ER if he feels she needs hydration today. Otherwise will get her in for IV fluids tomorrow. Spoke with patient and told her she needs to drink water or gatoraid every 15 minutes-add flavor to water if necessary and stop drinking carbonated drinks. Swelling in feet is likely related to her low albumin and lack of activity. Appointment request on scheduler desk and will call patient in the morning with her IV fluid treatment time.

## 2013-12-20 ENCOUNTER — Other Ambulatory Visit: Payer: Self-pay | Admitting: Nurse Practitioner

## 2013-12-20 ENCOUNTER — Ambulatory Visit (HOSPITAL_BASED_OUTPATIENT_CLINIC_OR_DEPARTMENT_OTHER): Payer: BC Managed Care – PPO | Admitting: Nurse Practitioner

## 2013-12-20 ENCOUNTER — Ambulatory Visit: Payer: BC Managed Care – PPO

## 2013-12-20 ENCOUNTER — Other Ambulatory Visit (HOSPITAL_BASED_OUTPATIENT_CLINIC_OR_DEPARTMENT_OTHER): Payer: BC Managed Care – PPO

## 2013-12-20 ENCOUNTER — Ambulatory Visit (HOSPITAL_BASED_OUTPATIENT_CLINIC_OR_DEPARTMENT_OTHER): Payer: BC Managed Care – PPO

## 2013-12-20 ENCOUNTER — Other Ambulatory Visit: Payer: Self-pay | Admitting: Oncology

## 2013-12-20 ENCOUNTER — Ambulatory Visit (HOSPITAL_COMMUNITY)
Admission: RE | Admit: 2013-12-20 | Discharge: 2013-12-20 | Disposition: A | Discharge status: Home / Self Care | Payer: BC Managed Care – PPO | Source: Ambulatory Visit | Attending: Oncology | Admitting: Oncology

## 2013-12-20 ENCOUNTER — Other Ambulatory Visit: Payer: Self-pay | Admitting: *Deleted

## 2013-12-20 VITALS — BP 143/68 | HR 84 | Temp 98.0°F | Resp 18

## 2013-12-20 DIAGNOSIS — R059 Cough, unspecified: Secondary | ICD-10-CM

## 2013-12-20 DIAGNOSIS — C3490 Malignant neoplasm of unspecified part of unspecified bronchus or lung: Secondary | ICD-10-CM | POA: Diagnosis not present

## 2013-12-20 DIAGNOSIS — D649 Anemia, unspecified: Secondary | ICD-10-CM

## 2013-12-20 DIAGNOSIS — R300 Dysuria: Secondary | ICD-10-CM

## 2013-12-20 DIAGNOSIS — C3410 Malignant neoplasm of upper lobe, unspecified bronchus or lung: Secondary | ICD-10-CM

## 2013-12-20 DIAGNOSIS — R05 Cough: Secondary | ICD-10-CM

## 2013-12-20 DIAGNOSIS — G620 Drug-induced polyneuropathy: Secondary | ICD-10-CM

## 2013-12-20 DIAGNOSIS — Z95828 Presence of other vascular implants and grafts: Secondary | ICD-10-CM

## 2013-12-20 DIAGNOSIS — C787 Secondary malignant neoplasm of liver and intrahepatic bile duct: Secondary | ICD-10-CM

## 2013-12-20 LAB — COMPREHENSIVE METABOLIC PANEL (CC13)
ALBUMIN: 2 g/dL — AB (ref 3.5–5.0)
ALT: 10 U/L (ref 0–55)
AST: 13 U/L (ref 5–34)
Alkaline Phosphatase: 104 U/L (ref 40–150)
Anion Gap: 8 mEq/L (ref 3–11)
BUN: 9.8 mg/dL (ref 7.0–26.0)
CO2: 25 mEq/L (ref 22–29)
Calcium: 9.2 mg/dL (ref 8.4–10.4)
Chloride: 102 mEq/L (ref 98–109)
Creatinine: 0.6 mg/dL (ref 0.6–1.1)
Glucose: 121 mg/dl (ref 70–140)
POTASSIUM: 3.8 meq/L (ref 3.5–5.1)
SODIUM: 135 meq/L — AB (ref 136–145)
TOTAL PROTEIN: 7.3 g/dL (ref 6.4–8.3)
Total Bilirubin: 0.39 mg/dL (ref 0.20–1.20)

## 2013-12-20 LAB — CBC WITH DIFFERENTIAL/PLATELET
BASO%: 0.4 % (ref 0.0–2.0)
Basophils Absolute: 0 10*3/uL (ref 0.0–0.1)
EOS%: 0.1 % (ref 0.0–7.0)
Eosinophils Absolute: 0 10*3/uL (ref 0.0–0.5)
HCT: 22.9 % — ABNORMAL LOW (ref 34.8–46.6)
HGB: 7.4 g/dL — ABNORMAL LOW (ref 11.6–15.9)
LYMPH%: 11 % — ABNORMAL LOW (ref 14.0–49.7)
MCH: 29.1 pg (ref 25.1–34.0)
MCHC: 32.5 g/dL (ref 31.5–36.0)
MCV: 89.5 fL (ref 79.5–101.0)
MONO#: 0.5 10*3/uL (ref 0.1–0.9)
MONO%: 6.8 % (ref 0.0–14.0)
NEUT#: 6.4 10*3/uL (ref 1.5–6.5)
NEUT%: 81.7 % — ABNORMAL HIGH (ref 38.4–76.8)
Platelets: 123 10*3/uL — ABNORMAL LOW (ref 145–400)
RBC: 2.56 10*6/uL — AB (ref 3.70–5.45)
RDW: 16.4 % — AB (ref 11.2–14.5)
WBC: 7.9 10*3/uL (ref 3.9–10.3)
lymph#: 0.9 10*3/uL (ref 0.9–3.3)

## 2013-12-20 LAB — ABO/RH: ABO/RH(D): A POS

## 2013-12-20 LAB — URINALYSIS, MICROSCOPIC - CHCC
Bilirubin (Urine): NEGATIVE
Blood: NEGATIVE
Glucose: NEGATIVE mg/dL
Ketones: NEGATIVE mg/dL
LEUKOCYTE ESTERASE: NEGATIVE
NITRITE: NEGATIVE
PROTEIN: NEGATIVE mg/dL
Specific Gravity, Urine: 1.01 (ref 1.003–1.035)
Urobilinogen, UR: 0.2 mg/dL (ref 0.2–1)
pH: 7.5 (ref 4.6–8.0)

## 2013-12-20 LAB — PREPARE RBC (CROSSMATCH)

## 2013-12-20 LAB — HOLD TUBE, BLOOD BANK

## 2013-12-20 MED ORDER — SODIUM CHLORIDE 0.9 % IJ SOLN
10.0000 mL | INTRAMUSCULAR | Status: AC | PRN
  Administered 2013-12-20: 10 mL
  Filled 2013-12-20: qty 10

## 2013-12-20 MED ORDER — SODIUM CHLORIDE 0.9 % IJ SOLN
10.0000 mL | INTRAMUSCULAR | Status: DC | PRN
  Administered 2013-12-20: 10 mL via INTRAVENOUS
  Filled 2013-12-20: qty 10

## 2013-12-20 MED ORDER — SODIUM CHLORIDE 0.9 % IV SOLN
250.0000 mL | Freq: Once | INTRAVENOUS | Status: AC
  Administered 2013-12-20: 250 mL via INTRAVENOUS

## 2013-12-20 MED ORDER — HEPARIN SOD (PORK) LOCK FLUSH 100 UNIT/ML IV SOLN
500.0000 [IU] | Freq: Every day | INTRAVENOUS | Status: AC | PRN
  Administered 2013-12-20: 500 [IU]
  Filled 2013-12-20: qty 5

## 2013-12-20 NOTE — Patient Instructions (Signed)

## 2013-12-20 NOTE — Progress Notes (Signed)
[facility] OFFICE PROGRESS NOTE   Diagnosis:  Non-small cell lung cancer.  INTERVAL HISTORY:   Christina Hale returns prior to scheduled followup. To review, she is a 63 year old woman with metastatic non-small cell lung cancer. She is on active treatment with Taxol/carboplatin and Avastin. Restaging CT evaluation after 2 cycles showed improvement. She completed cycle 3 on 12/02/2013.  She was seen in the emergency Department on 12/15/2013 with fatigue, productive cough and hoarseness. Chest x-ray showed no significant change in the appearance of the chest since the prior CT scan 11/14/2013 other than slight increase cavitation of the mass in the left upper lobe.  She was seen in the symptom management clinic at the [facility] on 12/16/2013 with same symptoms. She was prescribed Magic mouthwash and Carafate for mucositis. She also received IV fluids.  Her family contacted the office yesterday afternoon to request more IV fluids. She is seen today while in the infusion area.  She reports good fluid intake. She estimates drinking 4 bottles of water so far today. She is swallowing without difficulty. She has progressive hoarseness. She had mild hoarseness with cycles 1 and 2 and much more significant hoarseness following cycle 3. She has had a cough for the past 6 days. The cough is intermittently productive and seems to be worse at nighttime. She has a codeine cough medication which helps. She denies any fever or chills. She has mild dyspnea on exertion. She denies chest pain. No nausea or vomiting. She denies any bleeding. She no longer has any mouth sores. No diarrhea. She notes improvement in the numbness in her hands and feet. She denies hand or foot pain. No unusual headaches. No diplopia. She is having mild intermittent dysuria and wonders if she may have another urinary tract infection.  Objective:  Vital signs in last 24 hours:  Temperature 98.0, heart rate 105,  respirations 18, blood pressure 150/66.   She has marked hoarseness. HEENT: No thrush or ulcerations. Mucous membranes are moist. Resp: Lungs are clear. No wheezes or rales. No respiratory distress. Cardio: Regular cardiac rhythm. GI: Abdomen soft and nontender. No hepatomegaly. Vascular: Trace lower leg pitting edema bilaterally. Neuro: Alert and oriented. Pupils equal round and reactive to light. Extraocular movements intact. Vibratory sense for a mildly decreased over the fingertips per tuning fork exam. Moves all extremities. Skin: Skin turgor is mildly decreased. Soles with mild erythema. Nontender. Palms without erythema.  Port-A-Cath site is without erythema.    Lab Results:  Lab Results  Component Value Date   WBC 7.9 12/20/2013   HGB 7.4* 12/20/2013   HCT 22.9* 12/20/2013   MCV 89.5 12/20/2013   PLT 123* 12/20/2013   NEUTROABS 6.4 12/20/2013  Sodium 135, potassium 3.8, glucose 121, BUN 9.8, creatinine 0.6, bilirubin 0.39, total protein 7.3, albumin 2.0, calcium 9.2.  Imaging:  No results found.  Medications: I have reviewed the patient's current medications.  Assessment/Plan: 1. Stage IV adenocarcinoma of the lung, dominant left upper lobe mass and spiculated right upper lobe mass Status post a biopsy of a left hepatic mass confirming metastatic adenocarcinoma consistent with a lung primary, ALK mutation negative, EGFR amplification suboptimal-repeat testing recommend  Staging CT scans 09/27/2013 confirmed a dominant left upper lung mass, a smaller right upper lung mass, left hepatic mass, and left external iliac node.  Cycle 1 Taxol/carboplatin and Avastin per the CTSU P3295 study 10/09/2013.  Cycle 2 Taxol/carboplatin and Avastin 10/29/2013.  Restaging CT evaluation 11/14/2013 with improvement in the lungs and  liver. Left external iliac lymph node was stable. Cycle 3 Taxol/carboplatin and Avastin 12/02/2013. 2. C. difficile colitis March 2015 3. Hyponatremia/seizure on  hospital admission 08/29/2013 4. Chronic right hip "bursitis". 5. Nausea and vomiting following cycle 1 Taxol/carboplatin, the anti-emetic regimenwas adjusted with cycle 2. 6. Severe neutropenia following cycle 1 Taxol/carboplatin-Neulasta added with cycle 2. 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 9. Anemia. Likely secondary to chemotherapy. Progressive. 10. Hoarseness/cough. 11. Escherichia coli urinary tract infection 12/12/2013. She completed a course of Cipro.  Disposition: Christina Hale appear stable. She has a persistent cough and marked hoarseness. The etiology is unclear. Question vocal cord dysfunction related to cancer or treatment (reported with both Taxol and bevacizumab). If the hoarseness persists we will make a referral to ENT.  She also has progressive anemia most likely related to chemotherapy. She will receive one unit of packed red cells today and one tomorrow.  She has mild dysuria. She recently completed a course of Cipro for an Escherichia coli urinary tract infection. We will obtain a repeat urinalysis and culture today.  She is scheduled to return for a followup visit on 12/23/2013. She understands to contact the office in the interim with any change in her condition.   I discussed Christina Hale case with Dr. Alen Blew with his agreement. 35 minutes were spent face-to-face at today's visit with the majority of that time involved in counseling/coordination of care.   Ned Card ANP/GNP-BC   12/20/2013  2:59 PM

## 2013-12-21 ENCOUNTER — Ambulatory Visit (HOSPITAL_BASED_OUTPATIENT_CLINIC_OR_DEPARTMENT_OTHER): Payer: BC Managed Care – PPO

## 2013-12-21 VITALS — BP 131/55 | HR 82 | Temp 97.0°F | Resp 18

## 2013-12-21 DIAGNOSIS — D6481 Anemia due to antineoplastic chemotherapy: Secondary | ICD-10-CM

## 2013-12-21 DIAGNOSIS — D649 Anemia, unspecified: Secondary | ICD-10-CM | POA: Diagnosis not present

## 2013-12-21 DIAGNOSIS — T451X5A Adverse effect of antineoplastic and immunosuppressive drugs, initial encounter: Principal | ICD-10-CM

## 2013-12-21 LAB — URINE CULTURE

## 2013-12-21 MED ORDER — HEPARIN SOD (PORK) LOCK FLUSH 100 UNIT/ML IV SOLN
500.0000 [IU] | Freq: Every day | INTRAVENOUS | Status: AC | PRN
  Administered 2013-12-21: 500 [IU]
  Filled 2013-12-21: qty 5

## 2013-12-21 MED ORDER — SODIUM CHLORIDE 0.9 % IV SOLN
250.0000 mL | Freq: Once | INTRAVENOUS | Status: AC
  Administered 2013-12-21: 250 mL via INTRAVENOUS

## 2013-12-21 MED ORDER — SODIUM CHLORIDE 0.9 % IJ SOLN
10.0000 mL | INTRAMUSCULAR | Status: AC | PRN
  Administered 2013-12-21: 10 mL
  Filled 2013-12-21: qty 10

## 2013-12-21 NOTE — Patient Instructions (Signed)

## 2013-12-22 LAB — TYPE AND SCREEN
ABO/RH(D): A POS
ANTIBODY SCREEN: NEGATIVE
Unit division: 0
Unit division: 0

## 2013-12-23 ENCOUNTER — Other Ambulatory Visit (HOSPITAL_BASED_OUTPATIENT_CLINIC_OR_DEPARTMENT_OTHER): Payer: BC Managed Care – PPO

## 2013-12-23 ENCOUNTER — Ambulatory Visit (HOSPITAL_BASED_OUTPATIENT_CLINIC_OR_DEPARTMENT_OTHER): Payer: BC Managed Care – PPO | Admitting: Oncology

## 2013-12-23 ENCOUNTER — Encounter: Payer: Self-pay | Admitting: *Deleted

## 2013-12-23 ENCOUNTER — Ambulatory Visit: Payer: BC Managed Care – PPO

## 2013-12-23 ENCOUNTER — Telehealth: Payer: Self-pay | Admitting: Oncology

## 2013-12-23 ENCOUNTER — Telehealth: Payer: Self-pay | Admitting: *Deleted

## 2013-12-23 VITALS — BP 140/59 | HR 88 | Temp 98.3°F | Resp 18 | Ht 70.0 in | Wt 168.5 lb

## 2013-12-23 DIAGNOSIS — D649 Anemia, unspecified: Secondary | ICD-10-CM

## 2013-12-23 DIAGNOSIS — C787 Secondary malignant neoplasm of liver and intrahepatic bile duct: Secondary | ICD-10-CM

## 2013-12-23 DIAGNOSIS — R300 Dysuria: Secondary | ICD-10-CM

## 2013-12-23 DIAGNOSIS — C3410 Malignant neoplasm of upper lobe, unspecified bronchus or lung: Secondary | ICD-10-CM

## 2013-12-23 DIAGNOSIS — C3492 Malignant neoplasm of unspecified part of left bronchus or lung: Secondary | ICD-10-CM

## 2013-12-23 DIAGNOSIS — R498 Other voice and resonance disorders: Secondary | ICD-10-CM

## 2013-12-23 DIAGNOSIS — G620 Drug-induced polyneuropathy: Secondary | ICD-10-CM

## 2013-12-23 DIAGNOSIS — C3490 Malignant neoplasm of unspecified part of unspecified bronchus or lung: Secondary | ICD-10-CM

## 2013-12-23 LAB — CBC WITH DIFFERENTIAL/PLATELET
BASO%: 1 % (ref 0.0–2.0)
BASOS ABS: 0.1 10*3/uL (ref 0.0–0.1)
EOS%: 0.2 % (ref 0.0–7.0)
Eosinophils Absolute: 0 10*3/uL (ref 0.0–0.5)
HEMATOCRIT: 31.3 % — AB (ref 34.8–46.6)
HEMOGLOBIN: 10.3 g/dL — AB (ref 11.6–15.9)
LYMPH%: 10.9 % — ABNORMAL LOW (ref 14.0–49.7)
MCH: 29.2 pg (ref 25.1–34.0)
MCHC: 32.9 g/dL (ref 31.5–36.0)
MCV: 88.7 fL (ref 79.5–101.0)
MONO#: 0.5 10*3/uL (ref 0.1–0.9)
MONO%: 7.1 % (ref 0.0–14.0)
NEUT#: 5.7 10*3/uL (ref 1.5–6.5)
NEUT%: 80.8 % — AB (ref 38.4–76.8)
PLATELETS: 107 10*3/uL — AB (ref 145–400)
RBC: 3.53 10*6/uL — ABNORMAL LOW (ref 3.70–5.45)
RDW: 16.4 % — ABNORMAL HIGH (ref 11.2–14.5)
WBC: 7 10*3/uL (ref 3.9–10.3)
lymph#: 0.8 10*3/uL — ABNORMAL LOW (ref 0.9–3.3)

## 2013-12-23 LAB — COMPREHENSIVE METABOLIC PANEL (CC13)
ALT: 7 U/L (ref 0–55)
ANION GAP: 10 meq/L (ref 3–11)
AST: 15 U/L (ref 5–34)
Albumin: 2.2 g/dL — ABNORMAL LOW (ref 3.5–5.0)
Alkaline Phosphatase: 92 U/L (ref 40–150)
BILIRUBIN TOTAL: 0.63 mg/dL (ref 0.20–1.20)
BUN: 9 mg/dL (ref 7.0–26.0)
CALCIUM: 9.8 mg/dL (ref 8.4–10.4)
CHLORIDE: 102 meq/L (ref 98–109)
CO2: 25 mEq/L (ref 22–29)
CREATININE: 0.6 mg/dL (ref 0.6–1.1)
Glucose: 110 mg/dl (ref 70–140)
Potassium: 4.1 mEq/L (ref 3.5–5.1)
Sodium: 137 mEq/L (ref 136–145)
Total Protein: 8.1 g/dL (ref 6.4–8.3)

## 2013-12-23 LAB — MAGNESIUM (CC13): Magnesium: 1.8 mg/dl (ref 1.5–2.5)

## 2013-12-23 LAB — UA PROTEIN, DIPSTICK - CHCC: Protein, ur: <30 mg/dL

## 2013-12-23 LAB — LACTATE DEHYDROGENASE (CC13): LDH: 121 U/L — ABNORMAL LOW (ref 125–245)

## 2013-12-23 NOTE — Telephone Encounter (Signed)
gv and printed appt sched and avs for pt for July....MW added tx.Marland KitchenMarland Kitchenpt will see ENT Dr. Simeon Craft @ 2:30pm

## 2013-12-23 NOTE — Telephone Encounter (Signed)
Per staff message and POF I have scheduled appts. Advised scheduler of appts. JMW  

## 2013-12-23 NOTE — Progress Notes (Signed)
  [facility] OFFICE PROGRESS NOTE   Diagnosis: Non-small cell lung Hale  INTERVAL HISTORY:   Christina Hale returns as scheduled. She completed cycle 3 of Taxol/carboplatin/Avastin on 12/02/2013. She denies neuropathy symptoms. The hand and foot erythema has resolved. Christina Hale reports hoarseness and a cough beginning a few days following cycle 3. These symptoms have persisted. She was evaluated in the emergency room 12/15/2013 and a chest x-ray showed no acute finding. Carafate and Magic mouthwash have not helped.  She was seen in the office 12/20/2013 and noted to have severe anemia. She was transfused 2 units of packed red cells. Concern for vocal cord dysfunction related to Taxol or bevacizumab was raised.  She completed a course of ciprofloxacin for an Escherichia coli UTI and the dysuria has resolved.  Objective:  Vital signs in last 24 hours:  Blood pressure 140/59, pulse 88, temperature 98.3 F (36.8 C), temperature source Oral, resp. rate 18, height $RemoveBe'5\' 10"'HsnfhsMMk$  (1.778 m), weight 168 lb 8 oz (76.431 kg), SpO2 100.00%.    HEENT: No thrush or ulcers, pharynx without erythema or exudate Resp: Lungs clear bilaterally Cardio: Regular rate and rhythm GI: No hepatomegaly, nontender Vascular: No leg edema  Skin: Hands and feet without erythema   Portacath/PICC-without erythema  Lab Results:  Lab Results  Component Value Date   WBC 7.0 12/23/2013   HGB 10.3* 12/23/2013   HCT 31.3* 12/23/2013   MCV 88.7 12/23/2013   PLT 107* 12/23/2013   NEUTROABS 5.7 12/23/2013     Medications: I have reviewed the patient's current medications.  Assessment/Plan: 1. Stage IV adenocarcinoma of the lung, dominant left upper lobe mass and spiculated right upper lobe mass Status post a biopsy of a left hepatic mass confirming metastatic adenocarcinoma consistent with a lung primary, ALK mutation negative, EGFR amplification suboptimal-repeat testing recommend  Staging CT scans 09/27/2013  confirmed a dominant left upper lung mass, a smaller right upper lung mass, left hepatic mass, and left external iliac node.  Cycle 1 Taxol/carboplatin and Avastin per the CTSU H6579 study 10/09/2013.  Cycle 2 Taxol/carboplatin and Avastin 10/29/2013.  Restaging CT evaluation 11/14/2013 with improvement in the lungs and liver. Left external iliac lymph node was stable. Cycle 3 Taxol/carboplatin and Avastin 12/02/2013 2. C. difficile colitis March 2015 3. Hyponatremia/seizure on hospital admission 08/29/2013 4. Chronic right hip "bursitis". 5. Nausea and vomiting following cycle 1 Taxol/carboplatin, the anti-emetic regimenwas adjusted with cycle 2. 6. Severe neutropenia following cycle 1 Taxol/carboplatin-Neulasta added with cycle 2. 7. 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 9. Anemia. Likely secondary to chemotherapy. She received red cell transfusions 12/20/2013 and 12/21/2013 10. Hoarseness-this may be related to toxicity from Taxol or tumor involving the recurrent laryngeal nerve    Disposition:  She has developed grade 3 hoarseness following cycle 3 chemotherapy. Chemotherapy will be held today and we will make an ENT referral. She will return for an office visit in one week.  Her ECoG performance status is measured at 1 today.  Betsy Coder, MD  12/23/2013  9:00 AM

## 2013-12-23 NOTE — Progress Notes (Signed)
12/23/2013 Patient in to clinic today for evaluation at the end of Cycle 3. Patient has had multiple healthcare encounters during this cycle, and per Dr. Benay Spice, needs additional evaluation prior to continuing treatment. ENT referral planned for further evaluation of severe hoarseness, where patient is only able to speak in a whisper (grade 3). She complains of severely interrupted sleep due to coughing, which causes her to awaken every hour. Discussed need for six week CT scans within the next week; per Dr. Benay Spice, will need to undergo ENT evaluation first before scanning. Patient given oral contrast to take home, in the event CT scans are scheduled in the upcoming week. Patient and husband verbalized understanding with this plan. Additionally, if treatment is found to be the cause of the grade 3 hoarseness, treatment delay until improvement to grade 1, and dose modifications will occur. Cindy S. Brigitte Pulse BSN, RN, CCRP 12/23/2013 1:29 PM

## 2013-12-24 ENCOUNTER — Ambulatory Visit: Payer: BC Managed Care – PPO

## 2013-12-25 ENCOUNTER — Other Ambulatory Visit: Payer: Self-pay | Admitting: *Deleted

## 2013-12-25 DIAGNOSIS — C3490 Malignant neoplasm of unspecified part of unspecified bronchus or lung: Secondary | ICD-10-CM

## 2013-12-25 DIAGNOSIS — K1230 Oral mucositis (ulcerative), unspecified: Secondary | ICD-10-CM

## 2013-12-25 DIAGNOSIS — K1379 Other lesions of oral mucosa: Secondary | ICD-10-CM

## 2013-12-25 MED ORDER — TRAMADOL HCL 50 MG PO TABS: 50.0000 mg | ORAL_TABLET | Freq: Four times a day (QID) | ORAL | Status: DC | PRN

## 2013-12-25 MED ORDER — LORAZEPAM 1 MG PO TABS: 1.0000 mg | ORAL_TABLET | Freq: Every day | ORAL | Status: DC

## 2013-12-25 MED ORDER — MAGIC MOUTHWASH W/LIDOCAINE: 5.0000 mL | Freq: Four times a day (QID) | ORAL | Status: AC | PRN

## 2013-12-25 MED ORDER — HYDROCODONE-ACETAMINOPHEN 7.5-325 MG/15ML PO SOLN: 10.0000 mL | Freq: Four times a day (QID) | ORAL | Status: DC | PRN

## 2013-12-25 NOTE — Telephone Encounter (Signed)
Notified pt per request that re-fill pain script ready for pick-up and that MD ordered Ativan 1mg  at night for sleep but to NOT take with Xanax.  Pt verbalized understanding of instructions and expressed appreciation for call back.

## 2013-12-29 ENCOUNTER — Other Ambulatory Visit: Payer: Self-pay | Admitting: Oncology

## 2013-12-30 ENCOUNTER — Ambulatory Visit (HOSPITAL_BASED_OUTPATIENT_CLINIC_OR_DEPARTMENT_OTHER): Payer: BC Managed Care – PPO | Admitting: Oncology

## 2013-12-30 ENCOUNTER — Ambulatory Visit: Payer: BC Managed Care – PPO

## 2013-12-30 ENCOUNTER — Other Ambulatory Visit (HOSPITAL_BASED_OUTPATIENT_CLINIC_OR_DEPARTMENT_OTHER): Payer: BC Managed Care – PPO

## 2013-12-30 ENCOUNTER — Telehealth: Payer: Self-pay | Admitting: Oncology

## 2013-12-30 ENCOUNTER — Telehealth: Payer: Self-pay | Admitting: *Deleted

## 2013-12-30 ENCOUNTER — Ambulatory Visit: Payer: BC Managed Care – PPO | Admitting: Nutrition

## 2013-12-30 ENCOUNTER — Encounter: Payer: BC Managed Care – PPO | Admitting: *Deleted

## 2013-12-30 VITALS — BP 139/61 | HR 110 | Temp 97.4°F | Resp 19 | Ht 70.0 in | Wt 160.6 lb

## 2013-12-30 DIAGNOSIS — L270 Generalized skin eruption due to drugs and medicaments taken internally: Secondary | ICD-10-CM

## 2013-12-30 DIAGNOSIS — D649 Anemia, unspecified: Secondary | ICD-10-CM

## 2013-12-30 DIAGNOSIS — R059 Cough, unspecified: Secondary | ICD-10-CM

## 2013-12-30 DIAGNOSIS — R112 Nausea with vomiting, unspecified: Secondary | ICD-10-CM

## 2013-12-30 DIAGNOSIS — C3492 Malignant neoplasm of unspecified part of left bronchus or lung: Secondary | ICD-10-CM

## 2013-12-30 DIAGNOSIS — R05 Cough: Secondary | ICD-10-CM

## 2013-12-30 DIAGNOSIS — C787 Secondary malignant neoplasm of liver and intrahepatic bile duct: Secondary | ICD-10-CM

## 2013-12-30 DIAGNOSIS — R490 Dysphonia: Secondary | ICD-10-CM

## 2013-12-30 DIAGNOSIS — C3410 Malignant neoplasm of upper lobe, unspecified bronchus or lung: Secondary | ICD-10-CM

## 2013-12-30 DIAGNOSIS — A0472 Enterocolitis due to Clostridium difficile, not specified as recurrent: Secondary | ICD-10-CM

## 2013-12-30 DIAGNOSIS — D709 Neutropenia, unspecified: Secondary | ICD-10-CM

## 2013-12-30 LAB — MAGNESIUM (CC13): Magnesium: 1.9 mg/dl (ref 1.5–2.5)

## 2013-12-30 LAB — COMPREHENSIVE METABOLIC PANEL (CC13)
ALT: 9 U/L (ref 0–55)
ANION GAP: 10 meq/L (ref 3–11)
AST: 14 U/L (ref 5–34)
Albumin: 2.4 g/dL — ABNORMAL LOW (ref 3.5–5.0)
Alkaline Phosphatase: 107 U/L (ref 40–150)
BILIRUBIN TOTAL: 0.33 mg/dL (ref 0.20–1.20)
BUN: 12.6 mg/dL (ref 7.0–26.0)
CO2: 27 meq/L (ref 22–29)
Calcium: 10.2 mg/dL (ref 8.4–10.4)
Chloride: 101 mEq/L (ref 98–109)
Creatinine: 0.6 mg/dL (ref 0.6–1.1)
GLUCOSE: 103 mg/dL (ref 70–140)
Potassium: 4.2 mEq/L (ref 3.5–5.1)
Sodium: 138 mEq/L (ref 136–145)
TOTAL PROTEIN: 8.6 g/dL — AB (ref 6.4–8.3)

## 2013-12-30 LAB — CBC WITH DIFFERENTIAL/PLATELET
BASO%: 0.3 % (ref 0.0–2.0)
BASOS ABS: 0 10*3/uL (ref 0.0–0.1)
EOS ABS: 0 10*3/uL (ref 0.0–0.5)
EOS%: 0.3 % (ref 0.0–7.0)
HEMATOCRIT: 31.7 % — AB (ref 34.8–46.6)
HEMOGLOBIN: 10.2 g/dL — AB (ref 11.6–15.9)
LYMPH%: 11.2 % — AB (ref 14.0–49.7)
MCH: 28.4 pg (ref 25.1–34.0)
MCHC: 32.2 g/dL (ref 31.5–36.0)
MCV: 88.3 fL (ref 79.5–101.0)
MONO#: 0.6 10*3/uL (ref 0.1–0.9)
MONO%: 9.9 % (ref 0.0–14.0)
NEUT%: 78.3 % — AB (ref 38.4–76.8)
NEUTROS ABS: 4.7 10*3/uL (ref 1.5–6.5)
PLATELETS: 138 10*3/uL — AB (ref 145–400)
RBC: 3.59 10*6/uL — ABNORMAL LOW (ref 3.70–5.45)
RDW: 15.2 % — ABNORMAL HIGH (ref 11.2–14.5)
WBC: 6.1 10*3/uL (ref 3.9–10.3)
lymph#: 0.7 10*3/uL — ABNORMAL LOW (ref 0.9–3.3)

## 2013-12-30 LAB — LACTATE DEHYDROGENASE (CC13): LDH: 120 U/L — ABNORMAL LOW (ref 125–245)

## 2013-12-30 LAB — UA PROTEIN, DIPSTICK - CHCC: Protein, ur: 30 mg/dL

## 2013-12-30 MED ORDER — HYDROCOD POLST-CHLORPHEN POLST 10-8 MG/5ML PO LQCR: 5.0000 mL | Freq: Two times a day (BID) | ORAL | Status: DC | PRN

## 2013-12-30 NOTE — Progress Notes (Signed)
12/02/2013 Patient in to clinic today to receive induction Cycle 3 treatment, following a delay for grade 2 neuropathy, now improved to grade 1, and planned vacation the week of June 22nd. Patient also requested a return to Monday treatment, following the Memorial Day holiday, which caused treatment to be moved to Tuesday. Based on lab results review and history and physical by Ned Card NP, and reviewed by Dr. Benay Spice, patient condition is acceptable for continued treatment with reduced paclitaxel dose, per protocol section 5.4.1.1. Sign for infusion given to Ann Lions RN. Cindy S. Brigitte Pulse BSN, RN, [place] 12/03/2013 1:09 PM

## 2013-12-30 NOTE — Progress Notes (Signed)
  [facility] OFFICE PROGRESS NOTE   Diagnosis: Non-small cell lung cancer  INTERVAL HISTORY:   [name] returns as scheduled. She was evaluated by ENT and no vocal cord paralysis was noted. She continues to have hoarseness. She complains of an intermittent cough. No dyspnea. No neuropathy symptoms in the hands or feet.  Objective:  Vital signs in last 24 hours:  Blood pressure 139/61, pulse 110, temperature 97.4 F (36.3 C), temperature source Oral, resp. rate 19, height $RemoveBe'5\' 10"'ebqonVdcB$  (1.778 m), weight 160 lb 9.6 oz (72.848 kg), SpO2 99.00%.    HEENT: No thrush or ulcer Resp: Lungs clear bilaterally, no respiratory distress Cardio: Regular rate and rhythm GI: No hepatomegaly Vascular: No leg edema  Skin: Palms and hands without erythema. Mild erythema at the dorsum of the feet.   Portacath/PICC-without erythema  Lab Results:  Lab Results  Component Value Date   WBC 6.1 12/30/2013   HGB 10.2* 12/30/2013   HCT 31.7* 12/30/2013   MCV 88.3 12/30/2013   PLT 138* 12/30/2013   NEUTROABS 4.7 12/30/2013     Medications: I have reviewed the patient's current medications.  Assessment/Plan: 1. Stage IV adenocarcinoma of the lung, dominant left upper lobe mass and spiculated right upper lobe mass Status post a biopsy of a left hepatic mass confirming metastatic adenocarcinoma consistent with a lung primary, ALK mutation negative, EGFR amplification suboptimal-repeat testing recommend  Staging CT scans 09/27/2013 confirmed a dominant left upper lung mass, a smaller right upper lung mass, left hepatic mass, and left external iliac node.  Cycle 1 Taxol/carboplatin and Avastin per the CTSU Y6503 study 10/09/2013.  Cycle 2 Taxol/carboplatin and Avastin 10/29/2013.  Restaging CT evaluation 11/14/2013 with improvement in the lungs and liver. Left external iliac lymph node was stable.  Cycle 3 Taxol/carboplatin and Avastin 12/02/2013 2. C. difficile colitis March  2015 3. Hyponatremia/seizure on hospital admission 08/29/2013 4. Chronic right hip "bursitis". 5. Nausea and vomiting following cycle 1 Taxol/carboplatin, the anti-emetic regimenwas adjusted with cycle 2. 6. Severe neutropenia following cycle 1 Taxol/carboplatin-Neulasta added with cycle 2. 7. 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 9. Anemia. Likely secondary to chemotherapy. She received red cell transfusions 12/20/2013 and 12/21/2013 10. Hoarseness-this does not appear to be related to laryngeal nerve paralysis. The hoarseness may be related to toxicity from chemotherapy or the underlying lung tumor   Disposition:  She remains hoarse. The etiology is unclear. I am concerned the hoarseness is related to the lung cancer. We decided to proceed with a restaging CT evaluation. She will try Tussionex for the cough.  Her ECOG performance status is measured at 1 today.  Betsy Coder, MD  12/30/2013  11:24 AM

## 2013-12-30 NOTE — Telephone Encounter (Signed)
gv and printed appt sched and avs for pt for Aug....sed added tx. °

## 2013-12-30 NOTE — Progress Notes (Signed)
63 year old female diagnosed with lung cancer.  She is a patient of Dr. Benay Spice.  Past medical history includes hyperlipidemia, hypertension, prediabetes, vitamin D deficiency, and anxiety.  Medications include Xanax, vitamin D, multivitamin, and Carafate.  Labs include sodium 135, and albumin 2.2, July 20.  Height: 5 feet 10 inches. Weight: 160.6 pounds. Usual body weight: 180 pounds April 2015. BMI: 23.04.  Patient identified on the malnutrition risk screen secondary to poor appetite, and weight loss.    Patient complains of nausea.  She states she rarely eat solid food but she does drink oral nutrition supplements.  Patient endorses poor appetite.  Patient meets criteria for severe malnutrition in the context of chronic illness secondary to greater than 7 and a half percent weight loss in 3 months and less than 75% energy intake for greater than one month and depletion of body fat and muscle mass on physical exam.  Nutrition diagnosis: Inadequate oral intake related to poor appetite as evidenced by 20 pound weight loss in 3 months.  Intervention: Patient educated to increase oral intake of high-calorie, high-protein foods every 2-3 hours daily.  Reviewed strategies for preparing high-calorie milk shakes.  Reviewed easy to tolerate soft foods.  Recommended patient take nausea medication as needed for nausea.  Provided fact sheets and oral nutrition supplement samples.  Questions were answered.  Teach back method used.  Contact information given.  Monitoring, evaluation, goals: Patient will work to increase oral intake of soft foods and oral nutrition supplements to minimize further weight loss.  Next visit: Monday, August 10 in the infusion area.  **Disclaimer: This note was dictated with voice recognition software. Similar sounding words can inadvertently be transcribed and this note may contain transcription errors which may not have been corrected upon publication of note.**

## 2013-12-30 NOTE — Telephone Encounter (Signed)
Per [facility] staff message scheduled appts. Advised scheduler 

## 2013-12-30 NOTE — Progress Notes (Signed)
12/30/2013 Patient in to clinic today for additional evaluation following Cycle 3 treatment delay last week. Due to persistent hoarseness, possibly caused by study treatment, Cycle 4 will be delayed an additional week to allow for recovery. Will proceed with 6-week scans this week, however, patient states that she cannot schedule this until Thursday, due to keeping grandchildren on Tuesday and Wednesday. Patient will be re-evaluated on Monday 8/3 for continuation of induction therapy, if possible, or evaluation for Step 2 eligibility. Patient reports soreness on right buttock, per physical exam by Dr. Benay Spice, a small reddened area was identified. MD instructed patient to avoid pressure on that area. Cindy S. Brigitte Pulse BSN, RN, [place] 12/30/2013 1:01 PM

## 2013-12-31 ENCOUNTER — Ambulatory Visit: Payer: BC Managed Care – PPO

## 2014-01-02 ENCOUNTER — Ambulatory Visit (HOSPITAL_COMMUNITY)
Admission: RE | Admit: 2014-01-02 | Discharge: 2014-01-02 | Disposition: A | Discharge status: Home / Self Care | Payer: BC Managed Care – PPO | Source: Ambulatory Visit | Attending: Oncology | Admitting: Oncology

## 2014-01-02 ENCOUNTER — Encounter (HOSPITAL_COMMUNITY): Payer: Self-pay

## 2014-01-02 DIAGNOSIS — M1610 Unilateral primary osteoarthritis, unspecified hip: Secondary | ICD-10-CM | POA: Insufficient documentation

## 2014-01-02 DIAGNOSIS — I7090 Unspecified atherosclerosis: Secondary | ICD-10-CM | POA: Insufficient documentation

## 2014-01-02 DIAGNOSIS — R05 Cough: Secondary | ICD-10-CM | POA: Insufficient documentation

## 2014-01-02 DIAGNOSIS — E278 Other specified disorders of adrenal gland: Secondary | ICD-10-CM | POA: Insufficient documentation

## 2014-01-02 DIAGNOSIS — C3492 Malignant neoplasm of unspecified part of left bronchus or lung: Secondary | ICD-10-CM

## 2014-01-02 DIAGNOSIS — R634 Abnormal weight loss: Secondary | ICD-10-CM | POA: Insufficient documentation

## 2014-01-02 DIAGNOSIS — R490 Dysphonia: Secondary | ICD-10-CM | POA: Insufficient documentation

## 2014-01-02 DIAGNOSIS — R059 Cough, unspecified: Secondary | ICD-10-CM | POA: Insufficient documentation

## 2014-01-02 DIAGNOSIS — R918 Other nonspecific abnormal finding of lung field: Secondary | ICD-10-CM | POA: Insufficient documentation

## 2014-01-02 DIAGNOSIS — C3490 Malignant neoplasm of unspecified part of unspecified bronchus or lung: Secondary | ICD-10-CM | POA: Insufficient documentation

## 2014-01-02 DIAGNOSIS — R222 Localized swelling, mass and lump, trunk: Secondary | ICD-10-CM | POA: Insufficient documentation

## 2014-01-02 DIAGNOSIS — M169 Osteoarthritis of hip, unspecified: Secondary | ICD-10-CM | POA: Insufficient documentation

## 2014-01-02 DIAGNOSIS — IMO0002 Reserved for concepts with insufficient information to code with codable children: Secondary | ICD-10-CM | POA: Insufficient documentation

## 2014-01-06 ENCOUNTER — Other Ambulatory Visit (HOSPITAL_BASED_OUTPATIENT_CLINIC_OR_DEPARTMENT_OTHER): Payer: BC Managed Care – PPO

## 2014-01-06 ENCOUNTER — Encounter: Payer: Self-pay | Admitting: *Deleted

## 2014-01-06 ENCOUNTER — Telehealth: Payer: Self-pay | Admitting: Oncology

## 2014-01-06 ENCOUNTER — Ambulatory Visit (HOSPITAL_BASED_OUTPATIENT_CLINIC_OR_DEPARTMENT_OTHER): Payer: Self-pay

## 2014-01-06 ENCOUNTER — Ambulatory Visit (HOSPITAL_BASED_OUTPATIENT_CLINIC_OR_DEPARTMENT_OTHER): Payer: BC Managed Care – PPO | Admitting: Oncology

## 2014-01-06 VITALS — BP 159/67 | HR 124 | Temp 98.1°F | Resp 19 | Ht 70.0 in | Wt 158.9 lb

## 2014-01-06 DIAGNOSIS — C3410 Malignant neoplasm of upper lobe, unspecified bronchus or lung: Secondary | ICD-10-CM

## 2014-01-06 DIAGNOSIS — R490 Dysphonia: Secondary | ICD-10-CM

## 2014-01-06 DIAGNOSIS — C3492 Malignant neoplasm of unspecified part of left bronchus or lung: Secondary | ICD-10-CM

## 2014-01-06 DIAGNOSIS — C787 Secondary malignant neoplasm of liver and intrahepatic bile duct: Secondary | ICD-10-CM

## 2014-01-06 DIAGNOSIS — C3411 Malignant neoplasm of upper lobe, right bronchus or lung: Secondary | ICD-10-CM

## 2014-01-06 DIAGNOSIS — C3412 Malignant neoplasm of upper lobe, left bronchus or lung: Secondary | ICD-10-CM

## 2014-01-06 DIAGNOSIS — T451X5A Adverse effect of antineoplastic and immunosuppressive drugs, initial encounter: Secondary | ICD-10-CM

## 2014-01-06 DIAGNOSIS — L270 Generalized skin eruption due to drugs and medicaments taken internally: Secondary | ICD-10-CM

## 2014-01-06 DIAGNOSIS — G622 Polyneuropathy due to other toxic agents: Secondary | ICD-10-CM

## 2014-01-06 DIAGNOSIS — Z5112 Encounter for antineoplastic immunotherapy: Secondary | ICD-10-CM

## 2014-01-06 DIAGNOSIS — R110 Nausea: Secondary | ICD-10-CM

## 2014-01-06 DIAGNOSIS — D649 Anemia, unspecified: Secondary | ICD-10-CM

## 2014-01-06 DIAGNOSIS — D702 Other drug-induced agranulocytosis: Secondary | ICD-10-CM

## 2014-01-06 LAB — CBC WITH DIFFERENTIAL/PLATELET
BASO%: 0.9 % (ref 0.0–2.0)
Basophils Absolute: 0.1 10*3/uL (ref 0.0–0.1)
EOS ABS: 0 10*3/uL (ref 0.0–0.5)
EOS%: 0.3 % (ref 0.0–7.0)
HCT: 32.5 % — ABNORMAL LOW (ref 34.8–46.6)
HGB: 10.6 g/dL — ABNORMAL LOW (ref 11.6–15.9)
LYMPH%: 10.8 % — ABNORMAL LOW (ref 14.0–49.7)
MCH: 28 pg (ref 25.1–34.0)
MCHC: 32.5 g/dL (ref 31.5–36.0)
MCV: 86.2 fL (ref 79.5–101.0)
MONO#: 0.7 10*3/uL (ref 0.1–0.9)
MONO%: 7.9 % (ref 0.0–14.0)
NEUT%: 80.1 % — ABNORMAL HIGH (ref 38.4–76.8)
NEUTROS ABS: 7.3 10*3/uL — AB (ref 1.5–6.5)
Platelets: 179 10*3/uL (ref 145–400)
RBC: 3.77 10*6/uL (ref 3.70–5.45)
RDW: 16.2 % — AB (ref 11.2–14.5)
WBC: 9.1 10*3/uL (ref 3.9–10.3)
lymph#: 1 10*3/uL (ref 0.9–3.3)

## 2014-01-06 LAB — COMPREHENSIVE METABOLIC PANEL (CC13)
ALK PHOS: 113 U/L (ref 40–150)
ALT: 11 U/L (ref 0–55)
AST: 16 U/L (ref 5–34)
Albumin: 2.4 g/dL — ABNORMAL LOW (ref 3.5–5.0)
Anion Gap: 9 mEq/L (ref 3–11)
BUN: 12.8 mg/dL (ref 7.0–26.0)
CO2: 24 mEq/L (ref 22–29)
Calcium: 10.4 mg/dL (ref 8.4–10.4)
Chloride: 100 mEq/L (ref 98–109)
Creatinine: 0.7 mg/dL (ref 0.6–1.1)
Glucose: 135 mg/dl (ref 70–140)
POTASSIUM: 3.9 meq/L (ref 3.5–5.1)
Sodium: 133 mEq/L — ABNORMAL LOW (ref 136–145)
TOTAL PROTEIN: 8.4 g/dL — AB (ref 6.4–8.3)
Total Bilirubin: 0.38 mg/dL (ref 0.20–1.20)

## 2014-01-06 LAB — LACTATE DEHYDROGENASE (CC13): LDH: 119 U/L — ABNORMAL LOW (ref 125–245)

## 2014-01-06 LAB — UA PROTEIN, DIPSTICK - CHCC: Protein, ur: <30 mg/dL

## 2014-01-06 LAB — PROTHROMBIN TIME
INR: 1.27 (ref <1.50)
PROTHROMBIN TIME: 15.9 s — AB (ref 11.6–15.2)

## 2014-01-06 LAB — APTT: aPTT: 35 seconds (ref 24–37)

## 2014-01-06 LAB — MAGNESIUM (CC13): Magnesium: 1.7 mg/dl (ref 1.5–2.5)

## 2014-01-06 MED ORDER — SODIUM CHLORIDE 0.9 % IV SOLN
15.0000 mg/kg | Freq: Once | INTRAVENOUS | Status: AC
  Administered 2014-01-06: 1075 mg via INTRAVENOUS
  Filled 2014-01-06: qty 43

## 2014-01-06 MED ORDER — SODIUM CHLORIDE 0.9 % IV SOLN
Freq: Once | INTRAVENOUS | Status: AC
  Administered 2014-01-06: 10:00:00 via INTRAVENOUS

## 2014-01-06 MED ORDER — SODIUM CHLORIDE 0.9 % IV SOLN
Freq: Once | INTRAVENOUS | Status: AC
  Administered 2014-01-06: 12:00:00 via INTRAVENOUS

## 2014-01-06 MED ORDER — HEPARIN SOD (PORK) LOCK FLUSH 100 UNIT/ML IV SOLN
500.0000 [IU] | Freq: Once | INTRAVENOUS | Status: AC | PRN
Start: 2014-01-06 — End: 2014-01-06
  Administered 2014-01-06: 500 [IU]
  Filled 2014-01-06: qty 5

## 2014-01-06 MED ORDER — SODIUM CHLORIDE 0.9 % IJ SOLN
10.0000 mL | INTRAMUSCULAR | Status: DC | PRN
  Administered 2014-01-06: 10 mL
  Filled 2014-01-06: qty 10

## 2014-01-06 NOTE — Patient Instructions (Signed)
[facility] Discharge Instructions for Patients Receiving Chemotherapy  Today you received the following chemotherapy agents:  avastin  To help prevent nausea and vomiting after your treatment, we encourage you to take your nausea medication.   If you develop nausea and vomiting that is not controlled by your nausea medication, call the clinic.   BELOW ARE SYMPTOMS THAT SHOULD BE REPORTED IMMEDIATELY:  *FEVER GREATER THAN 100.5 F  *CHILLS WITH OR WITHOUT FEVER  NAUSEA AND VOMITING THAT IS NOT CONTROLLED WITH YOUR NAUSEA MEDICATION  *UNUSUAL SHORTNESS OF BREATH  *UNUSUAL BRUISING OR BLEEDING  TENDERNESS IN MOUTH AND THROAT WITH OR WITHOUT PRESENCE OF ULCERS  *URINARY PROBLEMS  *BOWEL PROBLEMS  UNUSUAL RASH Items with * indicate a potential emergency and should be followed up as soon as possible.  Feel free to call the clinic you have any questions or concerns. The clinic phone number is [phone].

## 2014-01-06 NOTE — Progress Notes (Signed)
[facility] OFFICE PROGRESS NOTE   Diagnosis: Non-small cell lung cancer  INTERVAL HISTORY:   Christina Hale returns as scheduled. The cough has improved with Tussionex. The hoarseness is unchanged. No bleeding or symptom of thrombosis. No neuropathy symptoms.  Objective:  Vital signs in last 24 hours:  Blood pressure 159/67, pulse 124, temperature 98.1 F (36.7 C), temperature source Oral, resp. rate 19, height 5' 10"  (1.778 m), weight 158 lb 14.4 oz (72.077 kg), SpO2 98.00%.    HEENT: No thrush or ulcer Resp: Lungs clear bilaterally Cardio: Regular rate and rhythm GI: No hepatomegaly, nontender, no mass Vascular: No leg edema  Skin: Mild dry desquamation at the soles. Hands without erythema   Portacath/PICC-without erythema  Lab Results:  Lab Results  Component Value Date   WBC 9.1 01/06/2014   HGB 10.6* 01/06/2014   HCT 32.5* 01/06/2014   MCV 86.2 01/06/2014   PLT 179 01/06/2014   NEUTROABS 7.3* 01/06/2014    Imaging:  Restaging CTs of the chest, abdomen, and pelvis on 01/02/2014, compared to a 11/14/2013-stable spiculated right upper lung nodule, increased cavitation of the left upper lobe mass. The prevascular lymph node measures 1.1 cm compared to 0.7 cm. The left upper lobe mass is slightly larger. Evidence of postobstructive pneumonitis in the left upper lobe. The left hepatic lobe mass is smaller. Stable nodularity of the left adrenal gland. A left external iliac node measures 1.1 cm compared to 1.5 cm.  I reviewed the CT images with Christina Hale.  Medications: I have reviewed the patient's current medications.  Assessment/Plan: 1. Stage IV adenocarcinoma of the lung, dominant left upper lobe mass and spiculated right upper lobe mass Status post a biopsy of a left hepatic mass confirming metastatic adenocarcinoma consistent with a lung primary, ALK mutation negative, EGFR amplification suboptimal-repeat testing recommend  Staging CT scans 09/27/2013 confirmed  a dominant left upper lung mass, a smaller right upper lung mass, left hepatic mass, and left external iliac node.  Cycle 1 Taxol/carboplatin and Avastin per the CTSU E2683 study 10/09/2013.  Cycle 2 Taxol/carboplatin and Avastin 10/29/2013.  Restaging CT evaluation 11/14/2013 with improvement in the lungs and liver. Left external iliac lymph node was stable.  Cycle 3 Taxol/carboplatin and Avastin 12/02/2013 Restaging CT 01/02/2014 with a mixed response-slightly larger left upper lung mass and mediastinal lymph node, slightly smaller liver mass and iliac node 2. C. difficile colitis March 2015 3. Hyponatremia/seizure on hospital admission 08/29/2013 4. Chronic right hip "bursitis". 5. Nausea and vomiting following cycle 1 Taxol/carboplatin, the anti-emetic regimenwas adjusted with cycle 2. 6. Severe neutropenia following cycle 1 Taxol/carboplatin-Neulasta added with cycle 2. 7. 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 9. Anemia. Likely secondary to chemotherapy. She received red cell transfusions 12/20/2013 and 12/21/2013       10. Hoarseness-stable-potentially related to toxicity from chemotherapy versus tumor    Disposition:  Her overall status appears unchanged. The restaging CT reveals overall stable disease. Her ECoG performance status is 1.  She continues to have hoarseness. This is potentially related to Taxol. I do not recommend further Taxol. I think it is likely she has achieved the maximum benefit from the Taxol/carboplatin regimen. We decided to refer her for the "maintenance" phase of the E5508 study.  I explained the rationale behind the randomization process. She was randomized to Avastin only. The plan is to begin maintenance Avastin today.  Christina Hale will return for an office visit and Avastin in 3 weeks.  She will be scheduled for a restaging CT per protocol.    Betsy Coder, MD  01/06/2014  1:39 PM

## 2014-01-06 NOTE — Progress Notes (Unsigned)
01/06/14 @ 10:55 am, CTSU T9774, Step 2-Randomized to ARM A, Maintenance bevacizumab:  Christina Hale into the [facility], accompanied by her daughter, for labs, to see Dr. Benay Hale and decide whether or not to proceed with Cycle 4 treatment. [name] continues to have hoarseness, grade 3.  She said it might have improved slightly, must if she speaks more than a few words it her voice is back to a whisper.  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  Dr. Benay Hale did not believe she would benefit from another treatment with Carbo/Taxol given the toxicities that she has experienced, so he would like for her to proceed to Step 2 maintenance therapy.  All of labs were appropriate for registration to Step 2.  Dr. Benay Hale did not feel that an EKG was medically necessary.  The Eligibility Checklist was completed and the patient was randomized to Arm A--bevacizumab maintenance therapy Q 3 weeks.  She decided she would like to start treatment today, therefore adjustments were made to her treatment plan to reflect the change of therapy.  The bevacizumab dose will be calculated based upon today's weight, which is 72.1 kg.  Spoke with the pharmacy regarding the change of treatment and the patient's infusion nurse, Christina Fall, RN.  Provided Christina Hale with a sign for the pump with protocol specific details of the treatment.

## 2014-01-06 NOTE — Telephone Encounter (Signed)
gv and printed appt sched and avs for pt for Aug....sed added tx. °

## 2014-01-07 ENCOUNTER — Ambulatory Visit: Payer: BC Managed Care – PPO

## 2014-01-10 ENCOUNTER — Other Ambulatory Visit: Payer: BC Managed Care – PPO

## 2014-01-10 ENCOUNTER — Ambulatory Visit: Payer: BC Managed Care – PPO | Admitting: Oncology

## 2014-01-13 ENCOUNTER — Ambulatory Visit: Payer: BC Managed Care – PPO

## 2014-01-13 ENCOUNTER — Encounter: Payer: BC Managed Care – PPO | Admitting: Nutrition

## 2014-01-14 ENCOUNTER — Telehealth: Payer: Self-pay | Admitting: *Deleted

## 2014-01-14 DIAGNOSIS — C3492 Malignant neoplasm of unspecified part of left bronchus or lung: Secondary | ICD-10-CM

## 2014-01-14 MED ORDER — MEGESTROL ACETATE 40 MG/ML PO SUSP: 200.0000 mg | Freq: Two times a day (BID) | ORAL | Status: DC

## 2014-01-14 MED ORDER — MEGESTROL ACETATE 40 MG/ML PO SUSP: 200.0000 mg | Freq: Every day | ORAL | Status: DC

## 2014-01-14 NOTE — Addendum Note (Signed)
Addended by: Tania Ade on: 01/14/2014 05:01 PM   Modules accepted: Orders

## 2014-01-14 NOTE — Telephone Encounter (Addendum)
Per Dr. Benay Spice, he suggests Megace 40mg /ml-take 5 ml bid instead. Patient agrees to try this.

## 2014-01-14 NOTE — Telephone Encounter (Signed)
Return call from research nurse : no restrictions on supportive care as long as it is appropriate.

## 2014-01-14 NOTE — Telephone Encounter (Signed)
Calling to ask what she could take for her appetite? Does not want to eat and still losing weight. Currently takes Xanax at bedtime and a friend suggested mirtazapine 15 mg nightly. Would it be OK to try that with the xanax? Is there something else she could try? Called Arther Abbott in clinical research and she will see if there are any contraindications to anything based on the research protocol she is on.

## 2014-01-16 ENCOUNTER — Telehealth: Payer: Self-pay | Admitting: *Deleted

## 2014-01-16 NOTE — Telephone Encounter (Signed)
Husband called with concerns that Christina Hale is getting very lethargic, sleeps a lot. Getting forgetful and confused at times. RN inquired about her gait and speech-he reports her speech is slurred " a little " and balance seems off-he walks with her to bathroom now. He is asking for lab work today. Instructed him to take her to emergency room now-she could have had a stroke or brain mets from her cancer or a bleed from the Avastin. This does not sound like s/s of anemia. He understands and agrees to take her to ED.

## 2014-01-19 ENCOUNTER — Emergency Department (HOSPITAL_COMMUNITY)
Admission: EM | Admit: 2014-01-19 | Discharge: 2014-01-19 | Disposition: A | Discharge status: Home / Self Care | Payer: BC Managed Care – PPO | Attending: Emergency Medicine | Admitting: Emergency Medicine

## 2014-01-19 ENCOUNTER — Encounter (HOSPITAL_COMMUNITY): Payer: Self-pay | Admitting: Emergency Medicine

## 2014-01-19 DIAGNOSIS — Z87891 Personal history of nicotine dependence: Secondary | ICD-10-CM | POA: Diagnosis not present

## 2014-01-19 DIAGNOSIS — Z7982 Long term (current) use of aspirin: Secondary | ICD-10-CM | POA: Diagnosis not present

## 2014-01-19 DIAGNOSIS — R05 Cough: Secondary | ICD-10-CM | POA: Diagnosis not present

## 2014-01-19 DIAGNOSIS — I10 Essential (primary) hypertension: Secondary | ICD-10-CM | POA: Diagnosis not present

## 2014-01-19 DIAGNOSIS — E559 Vitamin D deficiency, unspecified: Secondary | ICD-10-CM | POA: Diagnosis not present

## 2014-01-19 DIAGNOSIS — E785 Hyperlipidemia, unspecified: Secondary | ICD-10-CM | POA: Insufficient documentation

## 2014-01-19 DIAGNOSIS — F411 Generalized anxiety disorder: Secondary | ICD-10-CM | POA: Diagnosis not present

## 2014-01-19 DIAGNOSIS — Z79899 Other long term (current) drug therapy: Secondary | ICD-10-CM | POA: Diagnosis not present

## 2014-01-19 DIAGNOSIS — R059 Cough, unspecified: Secondary | ICD-10-CM | POA: Diagnosis not present

## 2014-01-19 DIAGNOSIS — R531 Weakness: Secondary | ICD-10-CM

## 2014-01-19 DIAGNOSIS — R5381 Other malaise: Secondary | ICD-10-CM | POA: Insufficient documentation

## 2014-01-19 DIAGNOSIS — E860 Dehydration: Secondary | ICD-10-CM | POA: Insufficient documentation

## 2014-01-19 DIAGNOSIS — N390 Urinary tract infection, site not specified: Secondary | ICD-10-CM | POA: Diagnosis not present

## 2014-01-19 DIAGNOSIS — Z85118 Personal history of other malignant neoplasm of bronchus and lung: Secondary | ICD-10-CM | POA: Insufficient documentation

## 2014-01-19 DIAGNOSIS — R5383 Other fatigue: Principal | ICD-10-CM

## 2014-01-19 LAB — URINALYSIS, ROUTINE W REFLEX MICROSCOPIC
BILIRUBIN URINE: NEGATIVE
GLUCOSE, UA: NEGATIVE mg/dL
HGB URINE DIPSTICK: NEGATIVE
KETONES UR: NEGATIVE mg/dL
NITRITE: NEGATIVE
PH: 6 (ref 5.0–8.0)
Protein, ur: NEGATIVE mg/dL
Specific Gravity, Urine: 1.01 (ref 1.005–1.030)
Urobilinogen, UA: 1 mg/dL (ref 0.0–1.0)

## 2014-01-19 LAB — CBC WITH DIFFERENTIAL/PLATELET
Basophils Absolute: 0 10*3/uL (ref 0.0–0.1)
Basophils Relative: 0 % (ref 0–1)
Eosinophils Absolute: 0.1 10*3/uL (ref 0.0–0.7)
Eosinophils Relative: 1 % (ref 0–5)
HCT: 29.8 % — ABNORMAL LOW (ref 36.0–46.0)
HEMOGLOBIN: 9.5 g/dL — AB (ref 12.0–15.0)
LYMPHS ABS: 1.5 10*3/uL (ref 0.7–4.0)
Lymphocytes Relative: 16 % (ref 12–46)
MCH: 26.6 pg (ref 26.0–34.0)
MCHC: 31.9 g/dL (ref 30.0–36.0)
MCV: 83.5 fL (ref 78.0–100.0)
MONOS PCT: 8 % (ref 3–12)
Monocytes Absolute: 0.8 10*3/uL (ref 0.1–1.0)
NEUTROS PCT: 75 % (ref 43–77)
Neutro Abs: 7.1 10*3/uL (ref 1.7–7.7)
Platelets: 207 10*3/uL (ref 150–400)
RBC: 3.57 MIL/uL — AB (ref 3.87–5.11)
RDW: 15.7 % — ABNORMAL HIGH (ref 11.5–15.5)
WBC: 9.4 10*3/uL (ref 4.0–10.5)

## 2014-01-19 LAB — COMPREHENSIVE METABOLIC PANEL
ALT: 9 U/L (ref 0–35)
ANION GAP: 13 (ref 5–15)
AST: 16 U/L (ref 0–37)
Albumin: 2.3 g/dL — ABNORMAL LOW (ref 3.5–5.2)
Alkaline Phosphatase: 140 U/L — ABNORMAL HIGH (ref 39–117)
BILIRUBIN TOTAL: 0.5 mg/dL (ref 0.3–1.2)
BUN: 11 mg/dL (ref 6–23)
CHLORIDE: 99 meq/L (ref 96–112)
CO2: 26 mEq/L (ref 19–32)
Calcium: 10.4 mg/dL (ref 8.4–10.5)
Creatinine, Ser: 0.54 mg/dL (ref 0.50–1.10)
GFR calc Af Amer: >90 mL/min (ref >90)
GFR calc non Af Amer: >90 mL/min (ref >90)
GLUCOSE: 113 mg/dL — AB (ref 70–99)
Potassium: 3.7 mEq/L (ref 3.7–5.3)
SODIUM: 138 meq/L (ref 137–147)
Total Protein: 8.6 g/dL — ABNORMAL HIGH (ref 6.0–8.3)

## 2014-01-19 LAB — URINE MICROSCOPIC-ADD ON

## 2014-01-19 LAB — I-STAT TROPONIN, ED: Troponin i, poc: 0.01 ng/mL (ref 0.00–0.08)

## 2014-01-19 MED ORDER — SODIUM CHLORIDE 0.9 % IV BOLUS (SEPSIS)
1000.0000 mL | Freq: Once | INTRAVENOUS | Status: AC
  Administered 2014-01-19: 1000 mL via INTRAVENOUS

## 2014-01-19 MED ORDER — CIPROFLOXACIN HCL 500 MG PO TABS: 500.0000 mg | ORAL_TABLET | Freq: Two times a day (BID) | ORAL | Status: DC

## 2014-01-19 MED ORDER — HEPARIN SOD (PORK) LOCK FLUSH 100 UNIT/ML IV SOLN
500.0000 [IU] | Freq: Once | INTRAVENOUS | Status: AC
  Administered 2014-01-19: 500 [IU]
  Filled 2014-01-19: qty 5

## 2014-01-19 NOTE — ED Notes (Signed)
Patient is still unable to urinate 

## 2014-01-19 NOTE — Discharge Instructions (Signed)
Fatigue Fatigue is a feeling of tiredness, lack of energy, lack of motivation, or feeling tired all the time. Having enough rest, good nutrition, and reducing stress will normally reduce fatigue. Consult your caregiver if it persists. The nature of your fatigue will help your caregiver to find out its cause. The treatment is based on the cause.  CAUSES  There are many causes for fatigue. Most of the time, fatigue can be traced to one or more of your habits or routines. Most causes fit into one or more of three general areas. They are: Lifestyle problems  Sleep disturbances.  Overwork.  Physical exertion.  Unhealthy habits.  Poor eating habits or eating disorders.  Alcohol and/or drug use .  Lack of proper nutrition (malnutrition). Psychological problems  Stress and/or anxiety problems.  Depression.  Grief.  Boredom. Medical Problems or Conditions  Anemia.  Pregnancy.  Thyroid gland problems.  Recovery from major surgery.  Continuous pain.  Emphysema or asthma that is not well controlled  Allergic conditions.  Diabetes.  Infections (such as mononucleosis).  Obesity.  Sleep disorders, such as sleep apnea.  Heart failure or other heart-related problems.  Cancer.  Kidney disease.  Liver disease.  Effects of certain medicines such as antihistamines, cough and cold remedies, prescription pain medicines, heart and blood pressure medicines, drugs used for treatment of cancer, and some antidepressants. SYMPTOMS  The symptoms of fatigue include:   Lack of energy.  Lack of drive (motivation).  Drowsiness.  Feeling of indifference to the surroundings. DIAGNOSIS  The details of how you feel help guide your caregiver in finding out what is causing the fatigue. You will be asked about your present and past health condition. It is important to review all medicines that you take, including prescription and non-prescription items. A thorough exam will be done.  You will be questioned about your feelings, habits, and normal lifestyle. Your caregiver may suggest blood tests, urine tests, or other tests to look for common medical causes of fatigue.  TREATMENT  Fatigue is treated by correcting the underlying cause. For example, if you have continuous pain or depression, treating these causes will improve how you feel. Similarly, adjusting the dose of certain medicines will help in reducing fatigue.  HOME CARE INSTRUCTIONS   Try to get the required amount of good sleep every night.  Eat a healthy and nutritious diet, and drink enough water throughout the day.  Practice ways of relaxing (including yoga or meditation).  Exercise regularly.  Make plans to change situations that cause stress. Act on those plans so that stresses decrease over time. Keep your work and personal routine reasonable.  Avoid street drugs and minimize use of alcohol.  Start taking a daily multivitamin after consulting your caregiver. SEEK MEDICAL CARE IF:   You have persistent tiredness, which cannot be accounted for.  You have fever.  You have unintentional weight loss.  You have headaches.  You have disturbed sleep throughout the night.  You are feeling sad.  You have constipation.  You have dry skin.  You have gained weight.  You are taking any new or different medicines that you suspect are causing fatigue.  You are unable to sleep at night.  You develop any unusual swelling of your legs or other parts of your body. SEEK IMMEDIATE MEDICAL CARE IF:   You are feeling confused.  Your vision is blurred.  You feel faint or pass out.  You develop severe headache.  You develop severe abdominal, pelvic, or  back pain.  You develop chest pain, shortness of breath, or an irregular or fast heartbeat.  You are unable to pass a normal amount of urine.  You develop abnormal bleeding such as bleeding from the rectum or you vomit blood.  You have thoughts  about harming yourself or committing suicide.  You are worried that you might harm someone else. MAKE SURE YOU:   Understand these instructions.  Will watch your condition.  Will get help right away if you are not doing well or get worse. Document Released: 03/20/2007 Document Revised: 08/15/2011 Document Reviewed: 09/24/2013 [facility] Patient Information 2015 [place], [place]. This information is not intended to replace advice given to you by your health care provider. Make sure you discuss any questions you have with your health care provider.  Urinary Tract Infection Urinary tract infections (UTIs) can develop anywhere along your urinary tract. Your urinary tract is your body's drainage system for removing wastes and extra water. Your urinary tract includes two kidneys, two ureters, a bladder, and a urethra. Your kidneys are a pair of bean-shaped organs. Each kidney is about the size of your fist. They are located below your ribs, one on each side of your spine. CAUSES Infections are caused by microbes, which are microscopic organisms, including fungi, viruses, and bacteria. These organisms are so small that they can only be seen through a microscope. Bacteria are the microbes that most commonly cause UTIs. SYMPTOMS  Symptoms of UTIs may vary by age and gender of the patient and by the location of the infection. Symptoms in young women typically include a frequent and intense urge to urinate and a painful, burning feeling in the bladder or urethra during urination. Older women and men are more likely to be tired, shaky, and weak and have muscle aches and abdominal pain. A fever may mean the infection is in your kidneys. Other symptoms of a kidney infection include pain in your back or sides below the ribs, nausea, and vomiting. DIAGNOSIS To diagnose a UTI, your caregiver will ask you about your symptoms. Your caregiver also will ask to provide a urine sample. The urine sample will be tested for  bacteria and white blood cells. White blood cells are made by your body to help fight infection. TREATMENT  Typically, UTIs can be treated with medication. Because most UTIs are caused by a bacterial infection, they usually can be treated with the use of antibiotics. The choice of antibiotic and length of treatment depend on your symptoms and the type of bacteria causing your infection. HOME CARE INSTRUCTIONS  If you were prescribed antibiotics, take them exactly as your caregiver instructs you. Finish the medication even if you feel better after you have only taken some of the medication.  Drink enough water and fluids to keep your urine clear or pale yellow.  Avoid caffeine, tea, and carbonated beverages. They tend to irritate your bladder.  Empty your bladder often. Avoid holding urine for long periods of time.  Empty your bladder before and after sexual intercourse.  After a bowel movement, women should cleanse from front to back. Use each tissue only once. SEEK MEDICAL CARE IF:   You have back pain.  You develop a fever.  Your symptoms do not begin to resolve within 3 days. SEEK IMMEDIATE MEDICAL CARE IF:   You have severe back pain or lower abdominal pain.  You develop chills.  You have nausea or vomiting.  You have continued burning or discomfort with urination. MAKE SURE YOU:  Understand these instructions.  Will watch your condition.  Will get help right away if you are not doing well or get worse. Document Released: 03/02/2005 Document Revised: 11/22/2011 Document Reviewed: 07/01/2011 [facility] Patient Information 2015 [place], [place]. This information is not intended to replace advice given to you by your health care provider. Make sure you discuss any questions you have with your health care provider.  Weakness Weakness is a lack of strength. It may be felt all over the body (generalized) or in one specific part of the body (focal). Some causes of weakness can  be serious. You may need further medical evaluation, especially if you are elderly or you have a history of immunosuppression (such as chemotherapy or HIV), kidney disease, heart disease, or diabetes. CAUSES  Weakness can be caused by many different things, including:  Infection.  Physical exhaustion.  Internal bleeding or other blood loss that results in a lack of red blood cells (anemia).  Dehydration. This cause is more common in elderly people.  Side effects or electrolyte abnormalities from medicines, such as pain medicines or sedatives.  Emotional distress, anxiety, or depression.  Circulation problems, especially severe peripheral arterial disease.  Heart disease, such as rapid atrial fibrillation, bradycardia, or heart failure.  Nervous system disorders, such as Guillain-Barr syndrome, multiple sclerosis, or stroke. DIAGNOSIS  To find the cause of your weakness, your caregiver will take your history and perform a physical exam. Lab tests or X-rays may also be ordered, if needed. TREATMENT  Treatment of weakness depends on the cause of your symptoms and can vary greatly. HOME CARE INSTRUCTIONS   Rest as needed.  Eat a well-balanced diet.  Try to get some exercise every day.  Only take over-the-counter or prescription medicines as directed by your caregiver. SEEK MEDICAL CARE IF:   Your weakness seems to be getting worse or spreads to other parts of your body.  You develop new aches or pains. SEEK IMMEDIATE MEDICAL CARE IF:   You cannot perform your normal daily activities, such as getting dressed and feeding yourself.  You cannot walk up and down stairs, or you feel exhausted when you do so.  You have shortness of breath or chest pain.  You have difficulty moving parts of your body.  You have weakness in only one area of the body or on only one side of the body.  You have a fever.  You have trouble speaking or swallowing.  You cannot control your bladder  or bowel movements.  You have black or bloody vomit or stools. MAKE SURE YOU:  Understand these instructions.  Will watch your condition.  Will get help right away if you are not doing well or get worse. Document Released: 05/23/2005 Document Revised: 11/22/2011 Document Reviewed: 07/22/2011 [facility] Patient Information 2015 [place], [place]. This information is not intended to replace advice given to you by your health care provider. Make sure you discuss any questions you have with your health care provider. Dehydration, Adult Dehydration is when you lose more fluids from the body than you take in. Vital organs like the kidneys, brain, and heart cannot function without a proper amount of fluids and salt. Any loss of fluids from the body can cause dehydration.  CAUSES   Vomiting.  Diarrhea.  Excessive sweating.  Excessive urine output.  Fever. SYMPTOMS  Mild dehydration  Thirst.  Dry lips.  Slightly dry mouth. Moderate dehydration  Very dry mouth.  Sunken eyes.  Skin does not bounce back quickly when lightly pinched  and released.  Dark urine and decreased urine production.  Decreased tear production.  Headache. Severe dehydration  Very dry mouth.  Extreme thirst.  Rapid, weak pulse (more than 100 beats per minute at rest).  Cold hands and feet.  Not able to sweat in spite of heat and temperature.  Rapid breathing.  Blue lips.  Confusion and lethargy.  Difficulty being awakened.  Minimal urine production.  No tears. DIAGNOSIS  Your caregiver will diagnose dehydration based on your symptoms and your exam. Blood and urine tests will help confirm the diagnosis. The diagnostic evaluation should also identify the cause of dehydration. TREATMENT  Treatment of mild or moderate dehydration can often be done at home by increasing the amount of fluids that you drink. It is best to drink small amounts of fluid more often. Drinking too much at one time can  make vomiting worse. Refer to the home care instructions below. Severe dehydration needs to be treated at the hospital where you will probably be given intravenous (IV) fluids that contain water and electrolytes. HOME CARE INSTRUCTIONS   Ask your caregiver about specific rehydration instructions.  Drink enough fluids to keep your urine clear or pale yellow.  Drink small amounts frequently if you have nausea and vomiting.  Eat as you normally do.  Avoid:  Foods or drinks high in sugar.  Carbonated drinks.  Juice.  Extremely hot or cold fluids.  Drinks with caffeine.  Fatty, greasy foods.  Alcohol.  Tobacco.  Overeating.  Gelatin desserts.  Wash your hands well to avoid spreading bacteria and viruses.  Only take over-the-counter or prescription medicines for pain, discomfort, or fever as directed by your caregiver.  Ask your caregiver if you should continue all prescribed and over-the-counter medicines.  Keep all follow-up appointments with your caregiver. SEEK MEDICAL CARE IF:  You have abdominal pain and it increases or stays in one area (localizes).  You have a rash, stiff neck, or severe headache.  You are irritable, sleepy, or difficult to awaken.  You are weak, dizzy, or extremely thirsty. SEEK IMMEDIATE MEDICAL CARE IF:   You are unable to keep fluids down or you get worse despite treatment.  You have frequent episodes of vomiting or diarrhea.  You have blood or green matter (bile) in your vomit.  You have blood in your stool or your stool looks black and tarry.  You have not urinated in 6 to 8 hours, or you have only urinated a small amount of very dark urine.  You have a fever.  You faint. MAKE SURE YOU:   Understand these instructions.  Will watch your condition.  Will get help right away if you are not doing well or get worse. Document Released: 05/23/2005 Document Revised: 08/15/2011 Document Reviewed: 01/10/2011 [facility] Patient  Information 2015 [place], [place]. This information is not intended to replace advice given to you by your health care provider. Make sure you discuss any questions you have with your health care provider.

## 2014-01-19 NOTE — ED Provider Notes (Signed)
TIME SEEN: 12:28 PM  CHIEF COMPLAINT: Generalized weakness, dehydration  HPI: Patient is a 63 y.o. F with history of hypertension, hyperlipidemia, lung cancer diagnosed in March 2015 his last chemotherapy was approximately 2 weeks ago who presents the emergency department generous weakness and decreased oral intake. She states that she has had a productive cough since June but this is unchanged. She has had multiple chest x-rays do not show any infiltrate she is even seen an ENT physician for further evaluation for this cough. She denies any fevers, chills, chest pain or shortness of breath, nausea, vomiting or diarrhea, melena or bloody stools. No numbness, tingling or focal weakness. No headache. She states that she has no appetite and food is not appealing to her. Have the report she has been sleeping more than normal. This is been going on for the past several days. She had similar symptoms before and required a blood transfusion.  ROS: See HPI Constitutional: no fever  Eyes: no drainage  ENT: no runny nose   Cardiovascular:  no chest pain  Resp: no SOB  GI: no vomiting GU: no dysuria Integumentary: no rash  Allergy: no hives  Musculoskeletal: no leg swelling  Neurological: no slurred speech ROS otherwise negative  PAST MEDICAL HISTORY/PAST SURGICAL HISTORY:  Past Medical History  Diagnosis Date  . Hyperlipidemia   . Hypertension   . Prediabetes   . Vitamin D deficiency   . Anxiety   . Cancer     lung ca dx'd 08/2013    MEDICATIONS:  Prior to Admission medications   Medication Sig Start Date End Date Taking? Authorizing Provider  ALPRAZolam Duanne Moron) 1 MG tablet Take 0.5-1 mg by mouth as needed for anxiety or sleep.    Yes Historical Provider, MD  Alum & Mag Hydroxide-Simeth (MAGIC MOUTHWASH W/LIDOCAINE) SOLN Take 5 mLs by mouth 4 (four) times daily as needed for mouth pain (swish for 1 minute, then spit). 12/25/13  Yes Ladell Pier, MD  amLODipine (NORVASC) 5 MG tablet Take 5  mg by mouth daily.   Yes Historical Provider, MD  aspirin 81 MG tablet Take 81 mg by mouth daily.   Yes Historical Provider, MD  chlorpheniramine-HYDROcodone (TUSSIONEX) 10-8 MG/5ML LQCR Take 5 mLs by mouth every 12 (twelve) hours as needed for cough. 12/30/13  Yes Ladell Pier, MD  Cholecalciferol (VITAMIN D) 2000 UNITS CAPS Take 4,000 Units by mouth daily.   Yes Historical Provider, MD  dextromethorphan-guaiFENesin (MUCINEX DM) 30-600 MG per 12 hr tablet Take 1 tablet by mouth 2 (two) times daily.   Yes Historical Provider, MD  megestrol (MEGACE ORAL) 40 MG/ML suspension Take 5 mLs (200 mg total) by mouth 2 (two) times daily. 01/14/14  Yes Ladell Pier, MD  Multiple Vitamin (MULTIVITAMIN WITH MINERALS) TABS tablet Take 1 tablet by mouth daily.   Yes Historical Provider, MD  nicotine (NICODERM CQ - DOSED IN MG/24 HR) 7 mg/24hr patch Place 7 mg onto the skin daily.   Yes Historical Provider, MD  traMADol (ULTRAM) 50 MG tablet Take 1 tablet (50 mg total) by mouth every 6 (six) hours as needed. 12/25/13  Yes Ladell Pier, MD    ALLERGIES:  Allergies  Allergen Reactions  . Codeine Nausea Only  . Prednisolone Nausea Only    SOCIAL HISTORY:  History  Substance Use Topics  . Smoking status: Former Smoker -- 1.00 packs/day for 40 years    Types: Cigarettes    Quit date: 08/30/2013  . Smokeless tobacco: Not  on file  . Alcohol Use: No    FAMILY HISTORY: Family History  Problem Relation Age of Onset  . Heart disease Mother   . Hypertension Mother   . Heart attack Mother   . Pneumonia Father     EXAM: BP 158/54  Pulse 105  Temp(Src) 97.5 F (36.4 C) (Oral)  Resp 16  SpO2 97% CONSTITUTIONAL: Alert and oriented and responds appropriately to questions. Well-appearing; well-nourished, in no distress, appears chronically ill HEAD: Normocephalic EYES: Conjunctivae clear, PERRL, no conjunctival pallor ENT: normal nose; no rhinorrhea; moist mucous membranes; pharynx without lesions  noted NECK: Supple, no meningismus, no LAD  CARD: RRR; S1 and S2 appreciated; no murmurs, no clicks, no rubs, no gallops RESP: Normal chest excursion without splinting or tachypnea; breath sounds clear and equal bilaterally; no wheezes, no rhonchi, no rales,  ABD/GI: Normal bowel sounds; non-distended; soft, non-tender, no rebound, no guarding RECTAL:  Patient has some erythema over her sacral area but no open lesions or drainage, no warmth BACK:  The back appears normal and is non-tender to palpation, there is no CVA tenderness EXT: Normal ROM in all joints; non-tender to palpation; no edema; normal capillary refill; no cyanosis    SKIN: Normal color for age and race; warm NEURO: Moves all extremities equally, sensation to light touch intact diffusely, cranial nerves II through XII intact PSYCH: The patient's mood and manner are appropriate. Grooming and personal hygiene are appropriate.  MEDICAL DECISION MAKING: Patient here with generalized weakness, dehydration. Likely secondary to chemotherapy and decreased appetite. She is hemodynamically stable, neurologically intact. History and exam are very nonfocal. We'll obtain screening labs, urine. Differential includes dehydration, anemia, electrolyte abnormality, ACS, infection. She is refusing a chest x-ray at this time she states her cough is chronic and unchanged and she states she's had multiple chest x-rays and does not want another today. She's not having any chest pain or shortness of breath. Her lungs are clear. We'll give IV fluids and reassess.  ED PROGRESS: Patient's labs are unremarkable. Hemoglobin is 9.5. She states she's feeling better IV fluids. She does appear to have a mild urinary tract infection. Urine cultures in the past has grown pansensitive Escherichia coli. We'll discharge with prescription for Cipro. At this time, do not feel patient meets any criteria for inpatient admission. Family is comfortable with taking the patient  home. Discussed strict return precautions and supportive care instructions. Reports understanding and is comfortable with plan.    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

## 2014-01-19 NOTE — ED Notes (Signed)
Pt from home c/o dehydration, inability to eat and cough. Pt is being tx for lung CA and last tx was 2 weeks ago. Pt oncologist requested that pt be seen in the ED due to increased weakness. Pt husband reports that pt sleeps a lot and has developed bed sores. Pt has stage 1 breakdown to sacral area. Pt is A&O and in NAD

## 2014-01-19 NOTE — ED Notes (Signed)
Patient is unable to urinate, will get urine sample after fluids have been run

## 2014-01-19 NOTE — ED Notes (Signed)
Family at bedside. 

## 2014-01-19 NOTE — ED Notes (Signed)
Bed: WA19 Expected date:  Expected time:  Means of arrival:  Comments: 

## 2014-01-20 ENCOUNTER — Other Ambulatory Visit: Payer: Self-pay | Admitting: *Deleted

## 2014-01-20 MED ORDER — PHENYLEPH-PROMETHAZINE-COD 5-6.25-10 MG/5ML PO SYRP: ORAL_SOLUTION | ORAL | Status: DC

## 2014-01-20 NOTE — Telephone Encounter (Signed)
Pt's husband came by & states pt went to the ED on Sat & received IVF & was told that she is just exhausted.  He states that she isn't sleeping due to coughing & tussionex is not helping.  Talked with Dr. Benay Spice & he states that he thinks that the cough is from her cancer & hopefully this will improve with treatment.  He suggested phenergan with codeine suspension & if this doesn't help to try tessalon pearls.  Script for phenergan/codeine susp called to pt pharmacy.

## 2014-01-21 ENCOUNTER — Telehealth: Payer: Self-pay | Admitting: *Deleted

## 2014-01-21 NOTE — Telephone Encounter (Signed)
Call from pt's husband reporting Phenergan-Codeine cough medicine caused vivid nightmares. Will go back to using Tussionex at night for sleep. Requesting to try Tessalon pearls for cough during the day. Currently using Hydrocodone elixir PRN for cough but this makes her drowsy. Asks if Mucinex is as good as Hydrocodone? Seems to help the cough some but is more expensive since OTC. Informed him it is OK to use OTC cough med. Will review with Dr. Benay Spice.

## 2014-01-23 ENCOUNTER — Other Ambulatory Visit: Payer: Self-pay | Admitting: *Deleted

## 2014-01-23 DIAGNOSIS — C3492 Malignant neoplasm of unspecified part of left bronchus or lung: Secondary | ICD-10-CM

## 2014-01-26 ENCOUNTER — Other Ambulatory Visit: Payer: Self-pay | Admitting: Oncology

## 2014-01-26 ENCOUNTER — Other Ambulatory Visit: Payer: Self-pay | Admitting: Physician Assistant

## 2014-01-27 ENCOUNTER — Encounter: Payer: Self-pay | Admitting: Internal Medicine

## 2014-01-27 ENCOUNTER — Telehealth: Payer: Self-pay | Admitting: Oncology

## 2014-01-27 ENCOUNTER — Ambulatory Visit: Payer: BC Managed Care – PPO | Admitting: Nutrition

## 2014-01-27 ENCOUNTER — Other Ambulatory Visit (HOSPITAL_BASED_OUTPATIENT_CLINIC_OR_DEPARTMENT_OTHER): Payer: BC Managed Care – PPO

## 2014-01-27 ENCOUNTER — Ambulatory Visit (HOSPITAL_BASED_OUTPATIENT_CLINIC_OR_DEPARTMENT_OTHER): Payer: BC Managed Care – PPO | Admitting: Oncology

## 2014-01-27 ENCOUNTER — Ambulatory Visit (HOSPITAL_BASED_OUTPATIENT_CLINIC_OR_DEPARTMENT_OTHER): Payer: BC Managed Care – PPO

## 2014-01-27 ENCOUNTER — Other Ambulatory Visit: Payer: Self-pay | Admitting: *Deleted

## 2014-01-27 ENCOUNTER — Encounter: Payer: BC Managed Care – PPO | Admitting: *Deleted

## 2014-01-27 VITALS — BP 130/44

## 2014-01-27 VITALS — BP 139/70 | HR 90 | Temp 97.6°F | Resp 20 | Ht 70.0 in | Wt 154.3 lb

## 2014-01-27 DIAGNOSIS — C787 Secondary malignant neoplasm of liver and intrahepatic bile duct: Secondary | ICD-10-CM

## 2014-01-27 DIAGNOSIS — C3492 Malignant neoplasm of unspecified part of left bronchus or lung: Secondary | ICD-10-CM

## 2014-01-27 DIAGNOSIS — R059 Cough, unspecified: Secondary | ICD-10-CM

## 2014-01-27 DIAGNOSIS — D649 Anemia, unspecified: Secondary | ICD-10-CM

## 2014-01-27 DIAGNOSIS — R05 Cough: Secondary | ICD-10-CM

## 2014-01-27 DIAGNOSIS — C3410 Malignant neoplasm of upper lobe, unspecified bronchus or lung: Secondary | ICD-10-CM

## 2014-01-27 DIAGNOSIS — Z5112 Encounter for antineoplastic immunotherapy: Secondary | ICD-10-CM

## 2014-01-27 DIAGNOSIS — G620 Drug-induced polyneuropathy: Secondary | ICD-10-CM

## 2014-01-27 DIAGNOSIS — C3412 Malignant neoplasm of upper lobe, left bronchus or lung: Secondary | ICD-10-CM

## 2014-01-27 LAB — RESEARCH LABS

## 2014-01-27 LAB — CBC WITH DIFFERENTIAL/PLATELET
BASO%: 0.4 % (ref 0.0–2.0)
Basophils Absolute: 0.1 10*3/uL (ref 0.0–0.1)
EOS%: 0.6 % (ref 0.0–7.0)
Eosinophils Absolute: 0.1 10*3/uL (ref 0.0–0.5)
HCT: 33 % — ABNORMAL LOW (ref 34.8–46.6)
HGB: 10.2 g/dL — ABNORMAL LOW (ref 11.6–15.9)
LYMPH#: 1.6 10*3/uL (ref 0.9–3.3)
LYMPH%: 13.5 % — AB (ref 14.0–49.7)
MCH: 25.9 pg (ref 25.1–34.0)
MCHC: 31 g/dL — AB (ref 31.5–36.0)
MCV: 83.7 fL (ref 79.5–101.0)
MONO#: 0.8 10*3/uL (ref 0.1–0.9)
MONO%: 6.7 % (ref 0.0–14.0)
NEUT#: 9.5 10*3/uL — ABNORMAL HIGH (ref 1.5–6.5)
NEUT%: 78.8 % — AB (ref 38.4–76.8)
PLATELETS: 198 10*3/uL (ref 145–400)
RBC: 3.94 10*6/uL (ref 3.70–5.45)
RDW: 17.6 % — ABNORMAL HIGH (ref 11.2–14.5)
WBC: 12 10*3/uL — ABNORMAL HIGH (ref 3.9–10.3)

## 2014-01-27 LAB — COMPREHENSIVE METABOLIC PANEL (CC13)
ALT: 6 U/L (ref 0–55)
ANION GAP: 7 meq/L (ref 3–11)
AST: 13 U/L (ref 5–34)
Albumin: 2.4 g/dL — ABNORMAL LOW (ref 3.5–5.0)
Alkaline Phosphatase: 111 U/L (ref 40–150)
BILIRUBIN TOTAL: 0.25 mg/dL (ref 0.20–1.20)
BUN: 11.9 mg/dL (ref 7.0–26.0)
CALCIUM: 9.9 mg/dL (ref 8.4–10.4)
CHLORIDE: 104 meq/L (ref 98–109)
CO2: 27 meq/L (ref 22–29)
Creatinine: 0.7 mg/dL (ref 0.6–1.1)
Glucose: 117 mg/dl (ref 70–140)
Potassium: 3.7 mEq/L (ref 3.5–5.1)
Sodium: 138 mEq/L (ref 136–145)
Total Protein: 8.2 g/dL (ref 6.4–8.3)

## 2014-01-27 LAB — MAGNESIUM (CC13)

## 2014-01-27 LAB — UA PROTEIN, DIPSTICK - CHCC: PROTEIN: NEGATIVE mg/dL

## 2014-01-27 LAB — LACTATE DEHYDROGENASE (CC13): LDH: 115 U/L — AB (ref 125–245)

## 2014-01-27 MED ORDER — SODIUM CHLORIDE 0.9 % IV SOLN: Freq: Once | INTRAVENOUS | Status: DC

## 2014-01-27 MED ORDER — SODIUM CHLORIDE 0.9 % IV SOLN
Freq: Once | INTRAVENOUS | Status: AC
  Administered 2014-01-27: 15:00:00 via INTRAVENOUS

## 2014-01-27 MED ORDER — SODIUM CHLORIDE 0.9 % IV SOLN
15.0000 mg/kg | Freq: Once | INTRAVENOUS | Status: AC
  Administered 2014-01-27: 1075 mg via INTRAVENOUS
  Filled 2014-01-27: qty 43

## 2014-01-27 MED ORDER — HEPARIN SOD (PORK) LOCK FLUSH 100 UNIT/ML IV SOLN
500.0000 [IU] | Freq: Once | INTRAVENOUS | Status: AC | PRN
  Administered 2014-01-27: 500 [IU]
  Filled 2014-01-27: qty 5

## 2014-01-27 MED ORDER — HYDROCOD POLST-CHLORPHEN POLST 10-8 MG/5ML PO LQCR: 5.0000 mL | Freq: Two times a day (BID) | ORAL | Status: DC | PRN

## 2014-01-27 MED ORDER — SODIUM CHLORIDE 0.9 % IJ SOLN
10.0000 mL | INTRAMUSCULAR | Status: DC | PRN
  Administered 2014-01-27: 10 mL
  Filled 2014-01-27: qty 10

## 2014-01-27 NOTE — Progress Notes (Signed)
  [facility] OFFICE PROGRESS NOTE   Diagnosis: Non-small cell lung cancer  INTERVAL HISTORY:   Christina Hale returns as scheduled. The hoarseness has improved. No hand or foot pain. She continues to have a cough that is worse at night. Her appetite has improved over the past week. Her activity level remains limited. No bleeding.  Objective:  Vital signs in last 24 hours:  Blood pressure 139/70, pulse 90, temperature 97.6 F (36.4 C), temperature source Oral, resp. rate 20, height $RemoveBe'5\' 10"'sXQLIsTfM$  (1.778 m), weight 154 lb 4.8 oz (69.99 kg), SpO2 99.00%.    HEENT: No thrush or ulcers Resp: Lungs clear bilaterally Cardio: Regular rate and rhythm GI: No hepatomegaly Vascular: No leg edema  Skin: Palms without erythema, dry desquamation at the soles   Portacath/PICC-without erythema  Lab Results:  Lab Results  Component Value Date   WBC 12.0* 01/27/2014   HGB 10.2* 01/27/2014   HCT 33.0* 01/27/2014   MCV 83.7 01/27/2014   PLT 198 01/27/2014   NEUTROABS 9.5* 01/27/2014     Medications: I have reviewed the patient's current medications.  Assessment/Plan: 1. Stage IV adenocarcinoma of the lung, dominant left upper lobe mass and spiculated right upper lobe mass Status post a biopsy of a left hepatic mass confirming metastatic adenocarcinoma consistent with a lung primary, ALK mutation negative, EGFR amplification suboptimal-repeat testing recommend  Staging CT scans 09/27/2013 confirmed a dominant left upper lung mass, a smaller right upper lung mass, left hepatic mass, and left external iliac node.  Cycle 1 Taxol/carboplatin and Avastin per the CTSU W8889 study 10/09/2013.  Cycle 2 Taxol/carboplatin and Avastin 10/29/2013.  Restaging CT evaluation 11/14/2013 with improvement in the lungs and liver. Left external iliac lymph node was stable.  Cycle 3 Taxol/carboplatin and Avastin 12/02/2013  Restaging CT 01/02/2014 with a mixed response-slightly larger left upper lung mass and  mediastinal lymph node, slightly smaller liver mass and iliac node Maintenance Avastin beginning 01/06/2014 2. C. difficile colitis March 2015 3. Hyponatremia/seizure on hospital admission 08/29/2013 4. Chronic right hip "bursitis". 5. Nausea and vomiting following cycle 1 Taxol/carboplatin, the anti-emetic regimenwas adjusted with cycle 2. 6. Severe neutropenia following cycle 1 Taxol/carboplatin-Neulasta added with cycle 2. 7. 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 9. Anemia. Likely secondary to chemotherapy. She received red cell transfusions 12/20/2013 and 12/21/2013     10. Hoarseness-improved, potentially related to toxicity from chemotherapy versus tumor    Disposition:  Ms. Brents will continue maintenance Avastin. She appears to be tolerating the Avastin well. Her overall performance status appears improved (ECoG 1-2). She will return for an office visit and Avastin in 3 weeks.  Ms. Korte will continue Tussionex as needed for the cough. She declined an antidepressant.  Betsy Coder, MD  01/27/2014  5:48 PM

## 2014-01-27 NOTE — Patient Instructions (Signed)
[facility] Discharge Instructions for Patients Receiving Chemotherapy  Today you received the following chemotherapy agents: Avastin  To help prevent nausea and vomiting after your treatment, we encourage you to take your nausea medication: Compazine 10 mg every 6 hours as needed.   If you develop nausea and vomiting that is not controlled by your nausea medication, call the clinic.   BELOW ARE SYMPTOMS THAT SHOULD BE REPORTED IMMEDIATELY:  *FEVER GREATER THAN 100.5 F  *CHILLS WITH OR WITHOUT FEVER  NAUSEA AND VOMITING THAT IS NOT CONTROLLED WITH YOUR NAUSEA MEDICATION  *UNUSUAL SHORTNESS OF BREATH  *UNUSUAL BRUISING OR BLEEDING  TENDERNESS IN MOUTH AND THROAT WITH OR WITHOUT PRESENCE OF ULCERS  *URINARY PROBLEMS  *BOWEL PROBLEMS  UNUSUAL RASH Items with * indicate a potential emergency and should be followed up as soon as possible.  Feel free to call the clinic you have any questions or concerns. The clinic phone number is [phone].

## 2014-01-27 NOTE — Progress Notes (Signed)
Pre and post Avastin VSS.

## 2014-01-27 NOTE — Progress Notes (Signed)
01/27/2014 Patient in to clinic this morning for evaluation prior to receiving maintenance treatment cycle 2. Research labs were collected prior treatment for Step 2, Cycle 2, Day 1 samples. Patient reports that her cough is just slightly improved, but hoarseness and appetite have greatly improved. Patient has ongoing minor skin breakdown on buttocks, although it is felt to be improving. Based on lab results review and history and physical by Dr. Benay Spice, patient condition is acceptable for continued treatment. Of note, MD felt that treatment could proceed in spite of decreased magnesium level. Sign for infusion with protocol specific instructions given to [place]. Cindy S. Brigitte Pulse BSN, RN, [place] 01/27/2014 3:55 PM

## 2014-01-27 NOTE — Telephone Encounter (Signed)
gv adn printed appt sched and avs for pt for SEpt...sed added tx.

## 2014-01-27 NOTE — Progress Notes (Signed)
Followup completed with patient and husband in chemotherapy. Weight continues to decline and was documented as 154.3 pounds August 24, down from 160.6 pounds July 27.  Patient reports she had a poor appetite.  She states her appetite "turned on like a light switch" on Thursday, this past week and she has been eating well since then.  Patient denies nutritional side effects at this time.  Nutrition diagnosis: Inadequate oral intake, improved.  Intervention: Encouraged patient to continue frequent meals with snacks utilizing high-calorie, high-protein foods. Patient to continue oral nutrition supplements as needed. Questions were answered.  Teach back method used.  Monitoring, evaluation, goals: Patient will continue to work to increase oral intake to promote weight stabilization.  Next visit: Monday, September 14, during chemotherapy.   **Disclaimer: This note was dictated with voice recognition software. Similar sounding words can inadvertently be transcribed and this note may contain transcription errors which may not have been corrected upon publication of note.**

## 2014-02-05 ENCOUNTER — Telehealth: Payer: Self-pay | Admitting: Internal Medicine

## 2014-02-05 ENCOUNTER — Telehealth: Payer: Self-pay | Admitting: *Deleted

## 2014-02-05 MED ORDER — AMLODIPINE BESYLATE 5 MG PO TABS: 5.0000 mg | ORAL_TABLET | Freq: Every day | ORAL | Status: DC

## 2014-02-05 NOTE — Telephone Encounter (Signed)
Patient called and requested refill on Amlodipine.  Patient being seen by oncologist and has not had an OV here since 08/2013.  Per Dr Melford Aase, [facility] to refill med for #30 and patient needs OV here.  I left message on her machine to call for an appointment.  Patient is n o longer taking HCTZ and pharmacist says RX will expire soon.

## 2014-02-05 NOTE — Telephone Encounter (Signed)
LEFT MESSAGE WITH PATIENT, DR MCKEOWN REQUESTS AN FOLLOW UP VISIT.  Thank you, Katrina Judeth Horn [facility] [phone] Fax [phone]

## 2014-02-10 ENCOUNTER — Other Ambulatory Visit: Payer: Self-pay | Admitting: Oncology

## 2014-02-17 ENCOUNTER — Telehealth: Payer: Self-pay | Admitting: Oncology

## 2014-02-17 ENCOUNTER — Ambulatory Visit (HOSPITAL_BASED_OUTPATIENT_CLINIC_OR_DEPARTMENT_OTHER): Payer: BC Managed Care – PPO | Admitting: Nurse Practitioner

## 2014-02-17 ENCOUNTER — Encounter: Payer: BC Managed Care – PPO | Admitting: *Deleted

## 2014-02-17 ENCOUNTER — Ambulatory Visit (HOSPITAL_BASED_OUTPATIENT_CLINIC_OR_DEPARTMENT_OTHER): Payer: BC Managed Care – PPO

## 2014-02-17 ENCOUNTER — Other Ambulatory Visit (HOSPITAL_BASED_OUTPATIENT_CLINIC_OR_DEPARTMENT_OTHER): Payer: BC Managed Care – PPO

## 2014-02-17 ENCOUNTER — Ambulatory Visit: Payer: BC Managed Care – PPO | Admitting: Nutrition

## 2014-02-17 VITALS — BP 139/68 | HR 104 | Temp 97.6°F | Resp 18 | Ht 70.0 in | Wt 154.7 lb

## 2014-02-17 DIAGNOSIS — Z23 Encounter for immunization: Secondary | ICD-10-CM

## 2014-02-17 DIAGNOSIS — D649 Anemia, unspecified: Secondary | ICD-10-CM

## 2014-02-17 DIAGNOSIS — C787 Secondary malignant neoplasm of liver and intrahepatic bile duct: Secondary | ICD-10-CM

## 2014-02-17 DIAGNOSIS — Z5112 Encounter for antineoplastic immunotherapy: Secondary | ICD-10-CM

## 2014-02-17 DIAGNOSIS — C3410 Malignant neoplasm of upper lobe, unspecified bronchus or lung: Secondary | ICD-10-CM

## 2014-02-17 DIAGNOSIS — C3490 Malignant neoplasm of unspecified part of unspecified bronchus or lung: Secondary | ICD-10-CM

## 2014-02-17 DIAGNOSIS — C3412 Malignant neoplasm of upper lobe, left bronchus or lung: Secondary | ICD-10-CM

## 2014-02-17 DIAGNOSIS — C3492 Malignant neoplasm of unspecified part of left bronchus or lung: Secondary | ICD-10-CM

## 2014-02-17 DIAGNOSIS — G620 Drug-induced polyneuropathy: Secondary | ICD-10-CM

## 2014-02-17 LAB — COMPREHENSIVE METABOLIC PANEL (CC13)
ALBUMIN: 2.6 g/dL — AB (ref 3.5–5.0)
ALK PHOS: 106 U/L (ref 40–150)
ALT: 8 U/L (ref 0–55)
AST: 15 U/L (ref 5–34)
Anion Gap: 8 mEq/L (ref 3–11)
BUN: 12.6 mg/dL (ref 7.0–26.0)
CHLORIDE: 104 meq/L (ref 98–109)
CO2: 26 mEq/L (ref 22–29)
Calcium: 10.4 mg/dL (ref 8.4–10.4)
Creatinine: 0.7 mg/dL (ref 0.6–1.1)
Glucose: 113 mg/dl (ref 70–140)
POTASSIUM: 3.7 meq/L (ref 3.5–5.1)
SODIUM: 138 meq/L (ref 136–145)
Total Bilirubin: 0.42 mg/dL (ref 0.20–1.20)
Total Protein: 7.9 g/dL (ref 6.4–8.3)

## 2014-02-17 LAB — CBC WITH DIFFERENTIAL/PLATELET
BASO%: 0.4 % (ref 0.0–2.0)
BASOS ABS: 0 10*3/uL (ref 0.0–0.1)
EOS%: 3.5 % (ref 0.0–7.0)
Eosinophils Absolute: 0.2 10*3/uL (ref 0.0–0.5)
HCT: 35 % (ref 34.8–46.6)
HGB: 11.1 g/dL — ABNORMAL LOW (ref 11.6–15.9)
LYMPH#: 1.3 10*3/uL (ref 0.9–3.3)
LYMPH%: 19.5 % (ref 14.0–49.7)
MCH: 26.2 pg (ref 25.1–34.0)
MCHC: 31.8 g/dL (ref 31.5–36.0)
MCV: 82.5 fL (ref 79.5–101.0)
MONO#: 0.6 10*3/uL (ref 0.1–0.9)
MONO%: 8.8 % (ref 0.0–14.0)
NEUT#: 4.4 10*3/uL (ref 1.5–6.5)
NEUT%: 67.8 % (ref 38.4–76.8)
Platelets: 124 10*3/uL — ABNORMAL LOW (ref 145–400)
RBC: 4.24 10*6/uL (ref 3.70–5.45)
RDW: 17.4 % — AB (ref 11.2–14.5)
WBC: 6.6 10*3/uL (ref 3.9–10.3)

## 2014-02-17 LAB — UA PROTEIN, DIPSTICK - CHCC: Protein, ur: <30 mg/dL

## 2014-02-17 LAB — MAGNESIUM (CC13): Magnesium: 1.6 mg/dl (ref 1.5–2.5)

## 2014-02-17 LAB — LACTATE DEHYDROGENASE (CC13): LDH: 143 U/L (ref 125–245)

## 2014-02-17 MED ORDER — SODIUM CHLORIDE 0.9 % IJ SOLN
10.0000 mL | INTRAMUSCULAR | Status: DC | PRN
  Administered 2014-02-17: 10 mL
  Filled 2014-02-17: qty 10

## 2014-02-17 MED ORDER — HEPARIN SOD (PORK) LOCK FLUSH 100 UNIT/ML IV SOLN
500.0000 [IU] | Freq: Once | INTRAVENOUS | Status: AC | PRN
  Administered 2014-02-17: 500 [IU]
  Filled 2014-02-17: qty 5

## 2014-02-17 MED ORDER — SODIUM CHLORIDE 0.9 % IV SOLN
15.0000 mg/kg | Freq: Once | INTRAVENOUS | Status: AC
  Administered 2014-02-17: 1075 mg via INTRAVENOUS
  Filled 2014-02-17: qty 43

## 2014-02-17 MED ORDER — SODIUM CHLORIDE 0.9 % IV SOLN
Freq: Once | INTRAVENOUS | Status: AC
  Administered 2014-02-17: 12:00:00 via INTRAVENOUS

## 2014-02-17 MED ORDER — INFLUENZA VAC SPLIT QUAD 0.5 ML IM SUSY
0.5000 mL | PREFILLED_SYRINGE | Freq: Once | INTRAMUSCULAR | Status: AC
  Administered 2014-02-17: 0.5 mL via INTRAMUSCULAR
  Filled 2014-02-17: qty 0.5

## 2014-02-17 MED ORDER — SODIUM CHLORIDE 0.9 % IV SOLN
Freq: Once | INTRAVENOUS | Status: AC
  Administered 2014-02-17: 13:00:00 via INTRAVENOUS

## 2014-02-17 NOTE — Progress Notes (Signed)
Nutrition followup completed with patient in chemotherapy.  She is being treated for non-small cell lung cancer.  Weight stable and documented as 154.7 pounds September 14.  Patient has no side effects.  Appetite continue to s to be increased.  Nutrition diagnosis: Inadequate oral intake resolved.  Recommended patient continue high-calorie, high-protein foods in frequent meals and snacks.  Patient to contact me with further questions or concerns.  **Disclaimer: This note was dictated with voice recognition software. Similar sounding words can inadvertently be transcribed and this note may contain transcription errors which may not have been corrected upon publication of note.**

## 2014-02-17 NOTE — Progress Notes (Signed)
02/17/2014 Patient in to clinic today for evaluation prior to receiving maintenance treatment cycle 3. She feels that her condition is improving quite a bit, although she was surprised that she had not gained more weight, since she believes that she has been eating quite well. Based on lab results review and history and physical by Ned Card NP, patient condition is acceptable for continued treatment. Sign for infusion given to Tonye Royalty, RN. Patient is in agreement with the current plan to undergo non-contrast CT scan at the end of the current cycle, followed by re-evaluation by Dr. Benay Spice on 03/10/2014. Cindy S. Brigitte Pulse BSN, RN, [place] 02/17/2014 1:55 PM

## 2014-02-17 NOTE — Progress Notes (Signed)
  [facility] OFFICE PROGRESS NOTE   Diagnosis:  Non-small cell lung cancer.  INTERVAL HISTORY:   Ms. Hoyos returns as scheduled. She was last treated with Avastin on 01/27/2014. Cough continues to be improved. She takes Tussionex and Mucinex. Hoarseness is better as well. She notes dyspnea with "overexertion". No fever. No chest pain. She denies leg swelling and calf pain. She denies abdominal pain. No bleeding. She notes easy bruising with mild trauma.  Objective:  Vital signs in last 24 hours:  Blood pressure 139/68, pulse 104, temperature 97.6 F (36.4 C), temperature source Oral, resp. rate 18, height $RemoveBe'5\' 10"'wyspiTKUl$  (1.778 m), weight 154 lb 11.2 oz (70.171 kg).    HEENT: No thrush or ulcers. Lymphatics: No palpable cervical or supraclavicular lymph nodes. Resp: Lungs clear bilaterally. Cardio: Regular rate and rhythm. GI: Abdomen soft and nontender. No hepatomegaly. Vascular: No leg edema. Calves soft and nontender. Neuro: Alert and oriented.  Skin: No rash. Port-A-Cath site without erythema.    Lab Results:  Lab Results  Component Value Date   WBC 6.6 02/17/2014   HGB 11.1* 02/17/2014   HCT 35.0 02/17/2014   MCV 82.5 02/17/2014   PLT 124* 02/17/2014   NEUTROABS 4.4 02/17/2014    Imaging:  No results found.  Medications: I have reviewed the patient's current medications.  Assessment/Plan: 1. Stage IV adenocarcinoma of the lung, dominant left upper lobe mass and spiculated right upper lobe mass Status post a biopsy of a left hepatic mass confirming metastatic adenocarcinoma consistent with a lung primary, ALK mutation negative, EGFR amplification suboptimal-repeat testing recommend  Staging CT scans 09/27/2013 confirmed a dominant left upper lung mass, a smaller right upper lung mass, left hepatic mass, and left external iliac node.  Cycle 1 Taxol/carboplatin and Avastin per the CTSU B9390 study 10/09/2013.  Cycle 2 Taxol/carboplatin and Avastin 10/29/2013.    Restaging CT evaluation 11/14/2013 with improvement in the lungs and liver. Left external iliac lymph node was stable.  Cycle 3 Taxol/carboplatin and Avastin 12/02/2013  Restaging CT 01/02/2014 with a mixed response-slightly larger left upper lung mass and mediastinal lymph node, slightly smaller liver mass and iliac node  Maintenance Avastin beginning 01/06/2014 2. C. difficile colitis March 2015 3. Hyponatremia/seizure on hospital admission 08/29/2013 4. Chronic right hip "bursitis". 5. Nausea and vomiting following cycle 1 Taxol/carboplatin, the anti-emetic regimen was adjusted with cycle 2. 6. Severe neutropenia following cycle 1 Taxol/carboplatin-Neulasta added with cycle 2. 7. 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 9. Anemia. Likely secondary to chemotherapy. She received red cell transfusions 12/20/2013 and 12/21/2013. 10. Hoarseness-improved, potentially related to toxicity from chemotherapy versus tumor.     Disposition: Ms. Braggs appears stable. Plan to continue maintenance Avastin. Next restaging CT evaluation planned 03/06/2014. She will return for a followup visit and Avastin in 3 weeks. She will contact the office in the interim with any problems. ECoG performance status measured at 1-2.    Ned Card ANP/GNP-BC   02/17/2014  11:04 AM

## 2014-02-17 NOTE — Telephone Encounter (Signed)
gv pt husband appt schedule for oct. central will call w/ct - pt/husband aware.

## 2014-02-26 ENCOUNTER — Other Ambulatory Visit: Payer: Self-pay | Admitting: *Deleted

## 2014-02-26 MED ORDER — HYDROCOD POLST-CHLORPHEN POLST 10-8 MG/5ML PO LQCR: 5.0000 mL | Freq: Two times a day (BID) | ORAL | Status: DC | PRN

## 2014-02-26 NOTE — Telephone Encounter (Signed)
Notified pt that re-fill script per request will be ready for pick-up this afternoon.  Pt verbalized understanding and expressed appreciation for call.

## 2014-03-03 ENCOUNTER — Encounter: Payer: Self-pay | Admitting: Internal Medicine

## 2014-03-03 ENCOUNTER — Ambulatory Visit (INDEPENDENT_AMBULATORY_CARE_PROVIDER_SITE_OTHER): Payer: BC Managed Care – PPO | Admitting: Internal Medicine

## 2014-03-03 VITALS — BP 154/76 | HR 108 | Temp 98.1°F | Resp 16 | Ht 70.0 in | Wt 155.4 lb

## 2014-03-03 DIAGNOSIS — R7309 Other abnormal glucose: Secondary | ICD-10-CM

## 2014-03-03 DIAGNOSIS — E559 Vitamin D deficiency, unspecified: Secondary | ICD-10-CM

## 2014-03-03 DIAGNOSIS — C3410 Malignant neoplasm of upper lobe, unspecified bronchus or lung: Secondary | ICD-10-CM

## 2014-03-03 DIAGNOSIS — R7303 Prediabetes: Secondary | ICD-10-CM

## 2014-03-03 DIAGNOSIS — Z79899 Other long term (current) drug therapy: Secondary | ICD-10-CM | POA: Insufficient documentation

## 2014-03-03 DIAGNOSIS — I10 Essential (primary) hypertension: Secondary | ICD-10-CM

## 2014-03-03 DIAGNOSIS — E785 Hyperlipidemia, unspecified: Secondary | ICD-10-CM

## 2014-03-03 MED ORDER — ALPRAZOLAM 1 MG PO TABS: 0.5000 mg | ORAL_TABLET | ORAL | Status: DC | PRN

## 2014-03-03 NOTE — Progress Notes (Signed)
Patient ID: Christina Hale, female   DOB: January 24, 1951, 63 y.o.   MRN: [id number]   This very nice 63 y.o.female presents for 3 month follow up with Hypertension, Hyperlipidemia, Pre-Diabetes and Vitamin D Deficiency. 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 Today's BP: 154/76 mmHg. Patient has had no complaints of any cardiac type chest pain, palpitations, dyspnea/orthopnea/PND, dizziness, claudication, or dependent edema.   Hyperlipidemia is controlled with diet & meds. Patient denies myalgias or other med SE's. Last Lipids were Total Chol  163; HDL Chol 66; LDL 84; Trig 66 on 08/26/2013.   Also, the patient has history of PreDiabetes and has had no symptoms of reactive hypoglycemia, diabetic polys, paresthesias or visual blurring.  Last A1c was 5.9% on 08/26/2013.    Further, the patient also has history of Vitamin D Deficiency (47 in 2008) and supplements vitamin D without any suspected side-effects. Last vitamin D was 75 on 08/26/2013.   Medication List   ALPRAZolam 1 MG tablet  Commonly known as:  XANAX  Take 0.5-1 mg by mouth as needed for anxiety or sleep.     amLODipine 5 MG tablet  Commonly known as:  NORVASC  Take 1 tablet (5 mg total) by mouth daily.     aspirin 81 MG tablet  Take 81 mg by mouth daily.     magic mouthwash w/lidocaine Soln  Take 5 mLs by mouth 4 (four) times daily as needed for mouth pain (swish for 1 minute, then spit).     megestrol 40 MG/ML suspension  Commonly known as:  MEGACE ORAL  Take 5 mLs (200 mg total) by mouth 2 (two) times daily.     multivitamin with minerals Tabs tablet  Take 1 tablet by mouth daily.     nicotine 7 mg/24hr patch  Commonly known as:  NICODERM CQ - dosed in mg/24 hr  Place 7 mg onto the skin daily.     traMADol 50 MG tablet  Commonly known as:  ULTRAM  Take 1 tablet (50 mg total) by mouth  every 6 (six) hours as needed.     Vitamin D 2000 UNITS Caps  Take 4,000 Units by mouth daily.     Allergies  Allergen Reactions  . Codeine Nausea Only  . Prednisolone Nausea Only   PMHx:   Past Medical History  Diagnosis Date  . Hyperlipidemia   . Hypertension   . Prediabetes   . Vitamin D deficiency   . Anxiety   . Cancer     lung ca dx'd 08/2013   FHx:    Reviewed / unchanged SHx:    Reviewed / unchanged   Systems Review:  Constitutional: Denies fever, chills, wt changes, headaches, insomnia, fatigue, night sweats, change in appetite. Eyes: Denies redness, blurred vision, diplopia, discharge, itchy, watery eyes.  ENT: Denies discharge, congestion, post nasal drip, epistaxis, sore throat, earache, hearing loss, dental pain, tinnitus, vertigo, sinus pain, snoring.  CV: Denies chest pain, palpitations, irregular heartbeat, syncope, dyspnea, diaphoresis, orthopnea, PND, claudication or edema. Respiratory: denies cough, dyspnea, DOE, pleurisy, hoarseness, laryngitis, wheezing.  Gastrointestinal: Denies dysphagia, odynophagia, heartburn, reflux, water brash, abdominal pain or cramps, nausea, vomiting, bloating, diarrhea, constipation, hematemesis, melena, hematochezia  or hemorrhoids. Genitourinary: Denies dysuria, frequency, urgency, nocturia, hesitancy, discharge, hematuria or flank pain. Musculoskeletal: Denies arthralgias, myalgias, stiffness, jt. swelling, pain, limping or  strain/sprain.  Skin: Denies pruritus, rash, hives, warts, acne, eczema or change in skin lesion(s). Neuro: No weakness, tremor, incoordination, spasms, paresthesia or pain. Psychiatric: Denies confusion, memory loss or sensory loss. Endo: Denies change in weight, skin or hair change.  Heme/Lymph: No excessive bleeding, bruising or enlarged lymph nodes.  Exam:  BP 154/76  Pulse 108  Temp(Src) 98.1 F (36.7 C) (Temporal)  Resp 16  Ht 5\' 10"  (1.778 m)  Wt 155 lb 6.4 oz (70.489 kg)  BMI 22.30  kg/m2  Appears malnourished with sunken orbits, hollow cheeks & temporal wasting and in no distress. Eyes: PERRLA, EOMs, conjunctiva no swelling or erythema. Sinuses: No frontal/maxillary tenderness ENT/Mouth: EAC's clear, TM's nl w/o erythema, bulging. Nares clear w/o erythema, swelling, exudates. Oropharynx clear without erythema or exudates. Oral hygiene is good. Tongue normal, non obstructing. Hearing intact.  Neck: Supple. Thyroid nl. Car 2+/2+ without bruits, nodes or JVD. Chest: Respirations nl with BS clear & equal w/o rales, rhonchi, wheezing or stridor.  Cor: Heart sounds normal w/ regular rate and rhythm without sig. murmurs, gallops, clicks, or rubs. Peripheral pulses normal and equal  without edema.  Abdomen: Soft & bowel sounds normal. Non-tender w/o guarding, rebound, hernias, masses, or organomegaly.  Lymphatics: Unremarkable.  Musculoskeletal: Full ROM all peripheral extremities, joint stability, 5/5 strength, and normal gait.  Skin: Warm, dry without exposed rashes, lesions or ecchymosis apparent.  Neuro: Cranial nerves intact, reflexes equal bilaterally. Sensory-motor testing grossly intact. Tendon reflexes grossly intact.  Pysch: Alert & oriented x 3.  Insight and judgement nl & appropriate. No ideations. Attitude is positive despite her prognosis.  Assessment and Plan:  1. Hypertension - Continue monitor blood pressure at home. Continue diet/meds same.  2. Hyperlipidemia - Continue diet/meds, exercise,& lifestyle modifications. Continue monitor periodic cholesterol/liver & renal functions   3.  Pre-Diabetes - Continue diet, exercise, lifestyle modifications. Monitor appropriate labs.  4. Vitamin D Deficiency - Continue supplementation.  5. RUL Lung Cancer (08/2013) metastatic to Liver -   Recommended regular exercise, BP monitoring, weight control, and discussed med and SE's. Recommended labs to assess and monitor clinical status. Further disposition pending results of  labs.]

## 2014-03-04 LAB — VITAMIN D 25 HYDROXY (VIT D DEFICIENCY, FRACTURES): Vit D, 25-Hydroxy: 59 ng/mL (ref 30–89)

## 2014-03-04 LAB — HEMOGLOBIN A1C
Hgb A1c MFr Bld: 5.3 % (ref <5.7)
Mean Plasma Glucose: 105 mg/dL (ref <117)

## 2014-03-04 LAB — INSULIN, FASTING: INSULIN FASTING, SERUM: 4.2 u[IU]/mL (ref 2.0–19.6)

## 2014-03-04 LAB — TSH: TSH: <0.008 u[IU]/mL — ABNORMAL LOW (ref 0.350–4.500)

## 2014-03-05 ENCOUNTER — Telehealth: Payer: Self-pay | Admitting: *Deleted

## 2014-03-05 NOTE — Telephone Encounter (Signed)
Call from pt requesting to have thyroid level checked inthis office. Reports her level was high at PCP. Pt instructed to have PCP send order. Will request lab to draw when here for treatment labs.

## 2014-03-06 ENCOUNTER — Encounter (HOSPITAL_COMMUNITY): Payer: Self-pay

## 2014-03-06 ENCOUNTER — Ambulatory Visit (HOSPITAL_COMMUNITY)
Admission: RE | Admit: 2014-03-06 | Discharge: 2014-03-06 | Disposition: A | Discharge status: Home / Self Care | Payer: BC Managed Care – PPO | Source: Ambulatory Visit | Attending: Nurse Practitioner | Admitting: Nurse Practitioner

## 2014-03-06 DIAGNOSIS — C3490 Malignant neoplasm of unspecified part of unspecified bronchus or lung: Secondary | ICD-10-CM | POA: Diagnosis present

## 2014-03-07 ENCOUNTER — Telehealth: Payer: Self-pay | Admitting: *Deleted

## 2014-03-07 NOTE — Telephone Encounter (Signed)
Pt notified of results below.Very pleased

## 2014-03-07 NOTE — Telephone Encounter (Signed)
Message copied by Patton Salles on Fri Mar 07, 2014 10:10 AM ------      Message from: Tania Ade      Created: Fri Mar 07, 2014  9:12 AM                   ----- Message -----         From: Ladell Pier, MD         Sent: 03/06/2014   7:18 PM           To: Tania Ade, RN, Ludwig Lean, RN, #            Please call patient, ct shows improvement in lung mass and liver lesion ------

## 2014-03-08 ENCOUNTER — Other Ambulatory Visit: Payer: Self-pay | Admitting: Oncology

## 2014-03-09 ENCOUNTER — Other Ambulatory Visit: Payer: Self-pay | Admitting: Internal Medicine

## 2014-03-10 ENCOUNTER — Encounter: Payer: Self-pay | Admitting: *Deleted

## 2014-03-10 ENCOUNTER — Ambulatory Visit (HOSPITAL_BASED_OUTPATIENT_CLINIC_OR_DEPARTMENT_OTHER): Payer: BC Managed Care – PPO | Admitting: Oncology

## 2014-03-10 ENCOUNTER — Telehealth: Payer: Self-pay | Admitting: Oncology

## 2014-03-10 ENCOUNTER — Other Ambulatory Visit (HOSPITAL_BASED_OUTPATIENT_CLINIC_OR_DEPARTMENT_OTHER): Payer: BC Managed Care – PPO

## 2014-03-10 ENCOUNTER — Ambulatory Visit (HOSPITAL_BASED_OUTPATIENT_CLINIC_OR_DEPARTMENT_OTHER): Payer: BC Managed Care – PPO

## 2014-03-10 ENCOUNTER — Other Ambulatory Visit: Payer: Self-pay | Admitting: *Deleted

## 2014-03-10 VITALS — BP 148/58 | HR 103 | Temp 98.0°F | Resp 18 | Ht 70.0 in | Wt 150.3 lb

## 2014-03-10 DIAGNOSIS — C3412 Malignant neoplasm of upper lobe, left bronchus or lung: Secondary | ICD-10-CM

## 2014-03-10 DIAGNOSIS — D649 Anemia, unspecified: Secondary | ICD-10-CM

## 2014-03-10 DIAGNOSIS — G622 Polyneuropathy due to other toxic agents: Secondary | ICD-10-CM

## 2014-03-10 DIAGNOSIS — Z5112 Encounter for antineoplastic immunotherapy: Secondary | ICD-10-CM

## 2014-03-10 DIAGNOSIS — C3490 Malignant neoplasm of unspecified part of unspecified bronchus or lung: Secondary | ICD-10-CM

## 2014-03-10 DIAGNOSIS — C787 Secondary malignant neoplasm of liver and intrahepatic bile duct: Secondary | ICD-10-CM

## 2014-03-10 LAB — CBC WITH DIFFERENTIAL/PLATELET
BASO%: 0.2 % (ref 0.0–2.0)
Basophils Absolute: 0 10*3/uL (ref 0.0–0.1)
EOS ABS: 0.3 10*3/uL (ref 0.0–0.5)
EOS%: 4.3 % (ref 0.0–7.0)
HEMATOCRIT: 38.2 % (ref 34.8–46.6)
HGB: 12.2 g/dL (ref 11.6–15.9)
LYMPH#: 1 10*3/uL (ref 0.9–3.3)
LYMPH%: 16.5 % (ref 14.0–49.7)
MCH: 26.8 pg (ref 25.1–34.0)
MCHC: 31.9 g/dL (ref 31.5–36.0)
MCV: 83.8 fL (ref 79.5–101.0)
MONO#: 0.4 10*3/uL (ref 0.1–0.9)
MONO%: 7.5 % (ref 0.0–14.0)
NEUT%: 71.5 % (ref 38.4–76.8)
NEUTROS ABS: 4.2 10*3/uL (ref 1.5–6.5)
PLATELETS: 95 10*3/uL — AB (ref 145–400)
RBC: 4.56 10*6/uL (ref 3.70–5.45)
RDW: 16.7 % — ABNORMAL HIGH (ref 11.2–14.5)
WBC: 5.8 10*3/uL (ref 3.9–10.3)
nRBC: 0 % (ref 0–0)

## 2014-03-10 LAB — COMPREHENSIVE METABOLIC PANEL (CC13)
ALBUMIN: 2.8 g/dL — AB (ref 3.5–5.0)
ALT: 14 U/L (ref 0–55)
ANION GAP: 8 meq/L (ref 3–11)
AST: 20 U/L (ref 5–34)
Alkaline Phosphatase: 107 U/L (ref 40–150)
BUN: 8.1 mg/dL (ref 7.0–26.0)
CALCIUM: 10.6 mg/dL — AB (ref 8.4–10.4)
CHLORIDE: 103 meq/L (ref 98–109)
CO2: 29 meq/L (ref 22–29)
CREATININE: 0.6 mg/dL (ref 0.6–1.1)
Glucose: 98 mg/dl (ref 70–140)
POTASSIUM: 3.2 meq/L — AB (ref 3.5–5.1)
Sodium: 140 mEq/L (ref 136–145)
Total Bilirubin: 0.46 mg/dL (ref 0.20–1.20)
Total Protein: 8.1 g/dL (ref 6.4–8.3)

## 2014-03-10 LAB — MAGNESIUM (CC13): Magnesium: 1.7 mg/dl (ref 1.5–2.5)

## 2014-03-10 LAB — LACTATE DEHYDROGENASE (CC13): LDH: 180 U/L (ref 125–245)

## 2014-03-10 LAB — UA PROTEIN, DIPSTICK - CHCC: Protein, ur: 30 mg/dL

## 2014-03-10 MED ORDER — POTASSIUM CHLORIDE 20 MEQ/15ML (10%) PO SOLN: 20.0000 meq | Freq: Every day | ORAL | Status: DC

## 2014-03-10 MED ORDER — SODIUM CHLORIDE 0.9 % IV SOLN
Freq: Once | INTRAVENOUS | Status: AC
  Administered 2014-03-10: 13:00:00 via INTRAVENOUS

## 2014-03-10 MED ORDER — BEVACIZUMAB CHEMO INJECTION 400 MG/16ML
15.0000 mg/kg | Freq: Once | INTRAVENOUS | Status: AC
  Administered 2014-03-10: 1075 mg via INTRAVENOUS
  Filled 2014-03-10: qty 43

## 2014-03-10 MED ORDER — HEPARIN SOD (PORK) LOCK FLUSH 100 UNIT/ML IV SOLN
500.0000 [IU] | Freq: Once | INTRAVENOUS | Status: AC | PRN
  Administered 2014-03-10: 500 [IU]
  Filled 2014-03-10: qty 5

## 2014-03-10 MED ORDER — SODIUM CHLORIDE 0.9 % IJ SOLN
10.0000 mL | INTRAMUSCULAR | Status: DC | PRN
  Administered 2014-03-10: 10 mL
  Filled 2014-03-10: qty 10

## 2014-03-10 MED ORDER — SODIUM CHLORIDE 0.9 % IV SOLN
Freq: Once | INTRAVENOUS | Status: AC
  Administered 2014-03-10: 11:00:00 via INTRAVENOUS

## 2014-03-10 MED ORDER — GUAIFENESIN ER 600 MG PO TB12: 600.0000 mg | ORAL_TABLET | Freq: Two times a day (BID) | ORAL | Status: DC | PRN

## 2014-03-10 NOTE — Progress Notes (Signed)
  [facility] OFFICE PROGRESS NOTE   Diagnosis: Non-small cell lung cancer  INTERVAL HISTORY:   [name] returns as scheduled. She continues to have a cough. She takes Mucinex during the day and Tussionex at night. Her appetite is poor. Her overall activity level has improved. She reports staying in bed for less than 20% of the day. No bleeding or symptom of thrombosis.  Objective:  Vital signs in last 24 hours:  Blood pressure 164/65, pulse 111, temperature 98 F (36.7 C), temperature source Oral, resp. rate 18, height _0  (1.778 m), weight 150 lb 4.8 oz (68.176 kg), SpO2 97.00%.    HEENT: No thrush or ulcers Resp: Lungs clear bilaterally Cardio: Regular rate and rhythm GI: No hepatomegaly Vascular: No leg edema   Portacath/PICC-without erythema  Lab Results:  Lab Results  Component Value Date   WBC 5.8 03/10/2014   HGB 12.2 03/10/2014   HCT 38.2 03/10/2014   MCV 83.8 03/10/2014   PLT 95* 03/10/2014   NEUTROABS 4.2 03/10/2014    Imaging: CTs of the chest, abdomen, and pelvis on 03/06/2014, compared to 01/02/2014: The left hepatic lobe lesion is slightly smaller, no new lesions. The right lung lesion and cavitary left upper lung mass are smaller. No new pulmonary lesions.  I reviewed the CT images with Ms. Calvert and her husband. Medications: I have reviewed the patient's current medications.  Assessment/Plan: 1. Stage IV adenocarcinoma of the lung, dominant left upper lobe mass and spiculated right upper lobe mass Status post a biopsy of a left hepatic mass confirming metastatic adenocarcinoma consistent with a lung primary, ALK mutation negative, EGFR amplification suboptimal-repeat testing recommend  Staging CT scans 09/27/2013 confirmed a dominant left upper lung mass, a smaller right upper lung mass, left hepatic mass, and left external iliac node.  Cycle 1 Taxol/carboplatin and Avastin per the CTSU F4142 study 10/09/2013.  Cycle 2 Taxol/carboplatin  and Avastin 10/29/2013.  Restaging CT evaluation 11/14/2013 with improvement in the lungs and liver. Left external iliac lymph node was stable.  Cycle 3 Taxol/carboplatin and Avastin 12/02/2013  Restaging CT 01/02/2014 with a mixed response-slightly larger left upper lung mass and mediastinal lymph node, slightly smaller liver mass and iliac node  Maintenance Avastin beginning 01/06/2014 Restaging CT 03/06/2014 with a decrease in the size of the lung masses and a liver lesion 2. C. difficile colitis March 2015 3. Hyponatremia/seizure on hospital admission 08/29/2013 4. Chronic right hip "bursitis". 5. Nausea and vomiting following cycle 1 Taxol/carboplatin, the anti-emetic regimen was adjusted with cycle 2. 6. Severe neutropenia following cycle 1 Taxol/carboplatin-Neulasta added with cycle 2. 7. 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 9. Anemia. Likely secondary to chemotherapy. She received red cell transfusions 12/20/2013 and 12/21/2013. 10. Hoarseness-improved, potentially related to toxicity from chemotherapy versus tumor.     Disposition:  Ms. Gardenhire appears stable. She does be tolerating the Avastin well. Her ECoG performance status is measured at 1 today. The restaging CT and indicates stable to improved disease. The plan is to continue every three-week Avastin. She will return for an office visit in 3 weeks. Betsy Coder, MD  03/10/2014  9:44 AM

## 2014-03-10 NOTE — Patient Instructions (Signed)
Christina Hale Discharge Instructions for Patients Receiving Chemotherapy  Today you received the following chemotherapy agents Avastin  To help prevent nausea and vomiting after your treatment, we encourage you to take your nausea medication as needed   If you develop nausea and vomiting that is not controlled by your nausea medication, call the clinic.   BELOW ARE SYMPTOMS THAT SHOULD BE REPORTED IMMEDIATELY:  *FEVER GREATER THAN 100.5 F  *CHILLS WITH OR WITHOUT FEVER  NAUSEA AND VOMITING THAT IS NOT CONTROLLED WITH YOUR NAUSEA MEDICATION  *UNUSUAL SHORTNESS OF BREATH  *UNUSUAL BRUISING OR BLEEDING  TENDERNESS IN MOUTH AND THROAT WITH OR WITHOUT PRESENCE OF ULCERS  *URINARY PROBLEMS  *BOWEL PROBLEMS  UNUSUAL RASH Items with * indicate a potential emergency and should be followed up as soon as possible.  Feel free to call the clinic you have any questions or concerns. The clinic phone number is [phone].

## 2014-03-10 NOTE — Telephone Encounter (Signed)
gv pt appt schedule for oct.

## 2014-03-11 ENCOUNTER — Encounter: Payer: Self-pay | Admitting: *Deleted

## 2014-03-13 NOTE — Progress Notes (Signed)
03/10/2014  0945 Cycle 4 Maintenance therapy with Bevacizumab.   Patient here with her husband for treatment.  She reports feeling well.  She complains of cough, but states it is better.  She denies having any fever/chills, bleeding, bruising, or skin rash.  Based on above, including lab results review and history and physical by Dr. Benay Spice, patient condition is acceptable for continued treatment per MD. Of note, MD felt that treatment could proceed in spite of decreased platelet and potassium level.  He felt treatment could proceed with increased Calcium level. Sign for infusion with protocol specific instructions given to [facility] RN.  Protocol criteria for treatment checked and verified with Raoul Pitch, RN.

## 2014-03-14 ENCOUNTER — Encounter: Payer: Self-pay | Admitting: Oncology

## 2014-03-14 NOTE — Progress Notes (Signed)
Per the [employer], these claims are still in process but were approved, just need to allow time for the remit to post. Per billing for [phone]

## 2014-03-20 ENCOUNTER — Other Ambulatory Visit: Payer: BC Managed Care – PPO

## 2014-03-20 DIAGNOSIS — E0590 Thyrotoxicosis, unspecified without thyrotoxic crisis or storm: Secondary | ICD-10-CM

## 2014-03-20 DIAGNOSIS — E213 Hyperparathyroidism, unspecified: Secondary | ICD-10-CM

## 2014-03-20 LAB — TSH: TSH: 0.008 u[IU]/mL — AB (ref 0.350–4.500)

## 2014-03-21 ENCOUNTER — Other Ambulatory Visit: Payer: Self-pay | Admitting: Internal Medicine

## 2014-03-21 DIAGNOSIS — E0590 Thyrotoxicosis, unspecified without thyrotoxic crisis or storm: Secondary | ICD-10-CM

## 2014-03-28 ENCOUNTER — Other Ambulatory Visit: Payer: Self-pay | Admitting: *Deleted

## 2014-03-28 DIAGNOSIS — C3412 Malignant neoplasm of upper lobe, left bronchus or lung: Secondary | ICD-10-CM

## 2014-03-30 ENCOUNTER — Other Ambulatory Visit: Payer: Self-pay | Admitting: Oncology

## 2014-03-31 ENCOUNTER — Encounter: Payer: BC Managed Care – PPO | Admitting: *Deleted

## 2014-03-31 ENCOUNTER — Other Ambulatory Visit: Payer: Self-pay | Admitting: *Deleted

## 2014-03-31 ENCOUNTER — Other Ambulatory Visit (HOSPITAL_BASED_OUTPATIENT_CLINIC_OR_DEPARTMENT_OTHER): Payer: BC Managed Care – PPO

## 2014-03-31 ENCOUNTER — Ambulatory Visit (HOSPITAL_BASED_OUTPATIENT_CLINIC_OR_DEPARTMENT_OTHER): Payer: BC Managed Care – PPO | Admitting: Oncology

## 2014-03-31 ENCOUNTER — Ambulatory Visit (HOSPITAL_BASED_OUTPATIENT_CLINIC_OR_DEPARTMENT_OTHER): Payer: BC Managed Care – PPO

## 2014-03-31 ENCOUNTER — Telehealth: Payer: Self-pay | Admitting: *Deleted

## 2014-03-31 ENCOUNTER — Telehealth: Payer: Self-pay | Admitting: Oncology

## 2014-03-31 VITALS — BP 141/61 | HR 89 | Temp 98.5°F | Resp 20 | Ht 70.0 in | Wt 151.5 lb

## 2014-03-31 DIAGNOSIS — C3412 Malignant neoplasm of upper lobe, left bronchus or lung: Secondary | ICD-10-CM

## 2014-03-31 DIAGNOSIS — C3411 Malignant neoplasm of upper lobe, right bronchus or lung: Secondary | ICD-10-CM

## 2014-03-31 DIAGNOSIS — Z5112 Encounter for antineoplastic immunotherapy: Secondary | ICD-10-CM

## 2014-03-31 DIAGNOSIS — R7989 Other specified abnormal findings of blood chemistry: Secondary | ICD-10-CM

## 2014-03-31 DIAGNOSIS — C787 Secondary malignant neoplasm of liver and intrahepatic bile duct: Secondary | ICD-10-CM

## 2014-03-31 DIAGNOSIS — M179 Osteoarthritis of knee, unspecified: Secondary | ICD-10-CM

## 2014-03-31 DIAGNOSIS — D649 Anemia, unspecified: Secondary | ICD-10-CM

## 2014-03-31 LAB — CBC WITH DIFFERENTIAL/PLATELET
BASO%: 0.3 % (ref 0.0–2.0)
Basophils Absolute: 0 10*3/uL (ref 0.0–0.1)
EOS ABS: 0.3 10*3/uL (ref 0.0–0.5)
EOS%: 2.9 % (ref 0.0–7.0)
HCT: 43.3 % (ref 34.8–46.6)
HGB: 14 g/dL (ref 11.6–15.9)
LYMPH%: 21.3 % (ref 14.0–49.7)
MCH: 27.3 pg (ref 25.1–34.0)
MCHC: 32.3 g/dL (ref 31.5–36.0)
MCV: 84.6 fL (ref 79.5–101.0)
MONO#: 0.7 10*3/uL (ref 0.1–0.9)
MONO%: 7.1 % (ref 0.0–14.0)
NEUT%: 68.4 % (ref 38.4–76.8)
NEUTROS ABS: 6.3 10*3/uL (ref 1.5–6.5)
NRBC: 0 % (ref 0–0)
Platelets: 113 10*3/uL — ABNORMAL LOW (ref 145–400)
RBC: 5.12 10*6/uL (ref 3.70–5.45)
RDW: 17 % — AB (ref 11.2–14.5)
WBC: 9.3 10*3/uL (ref 3.9–10.3)
lymph#: 2 10*3/uL (ref 0.9–3.3)

## 2014-03-31 LAB — COMPREHENSIVE METABOLIC PANEL (CC13)
ALK PHOS: 92 U/L (ref 40–150)
ALT: 20 U/L (ref 0–55)
ANION GAP: 6 meq/L (ref 3–11)
AST: 18 U/L (ref 5–34)
Albumin: 3.1 g/dL — ABNORMAL LOW (ref 3.5–5.0)
BILIRUBIN TOTAL: 0.28 mg/dL (ref 0.20–1.20)
BUN: 13.5 mg/dL (ref 7.0–26.0)
CO2: 25 meq/L (ref 22–29)
Calcium: 10.3 mg/dL (ref 8.4–10.4)
Chloride: 106 mEq/L (ref 98–109)
Creatinine: 0.6 mg/dL (ref 0.6–1.1)
Glucose: 100 mg/dl (ref 70–140)
Potassium: 4.2 mEq/L (ref 3.5–5.1)
SODIUM: 137 meq/L (ref 136–145)
Total Protein: 8.1 g/dL (ref 6.4–8.3)

## 2014-03-31 LAB — LACTATE DEHYDROGENASE (CC13): LDH: 202 U/L (ref 125–245)

## 2014-03-31 LAB — MAGNESIUM (CC13): Magnesium: 1.9 mg/dl (ref 1.5–2.5)

## 2014-03-31 LAB — T4: T4 TOTAL: 11.9 ug/dL (ref 4.5–12.0)

## 2014-03-31 LAB — TSH CHCC

## 2014-03-31 LAB — UA PROTEIN, DIPSTICK - CHCC: Protein, ur: 30 mg/dL

## 2014-03-31 MED ORDER — SODIUM CHLORIDE 0.9 % IV SOLN
15.0000 mg/kg | Freq: Once | INTRAVENOUS | Status: AC
  Administered 2014-03-31: 1075 mg via INTRAVENOUS
  Filled 2014-03-31: qty 43

## 2014-03-31 MED ORDER — SODIUM CHLORIDE 0.9 % IJ SOLN
10.0000 mL | INTRAMUSCULAR | Status: DC | PRN
  Administered 2014-03-31: 10 mL
  Filled 2014-03-31: qty 10

## 2014-03-31 MED ORDER — SODIUM CHLORIDE 0.9 % IV SOLN: Freq: Once | INTRAVENOUS | Status: DC

## 2014-03-31 MED ORDER — HEPARIN SOD (PORK) LOCK FLUSH 100 UNIT/ML IV SOLN
500.0000 [IU] | Freq: Once | INTRAVENOUS | Status: AC | PRN
  Administered 2014-03-31: 500 [IU]
  Filled 2014-03-31: qty 5

## 2014-03-31 MED ORDER — SODIUM CHLORIDE 0.9 % IV SOLN
Freq: Once | INTRAVENOUS | Status: AC
  Administered 2014-03-31: 12:00:00 via INTRAVENOUS

## 2014-03-31 NOTE — Telephone Encounter (Signed)
Per patient I have scheduled appts. Patient given calendar

## 2014-03-31 NOTE — Progress Notes (Signed)
03/31/2014 Patient in to clinic today for evaluation prior to receiving maintenance cycle 5 treatment. She feels that she is doing well overall, and much improved from her condition during induction therapy. Based on lab results review and history and physical by Dr. Benay Spice, patient condition is acceptable for continued treatment.  Of note, patient reports that her hip pain, which had improved while receiving steroid pre-meds during induction therapy, is now flaring up again. Patient inquired about ability to receiving steroid injection to hip; referral for f/u with Dr. Nelva Bush sent today by Dr. Benay Spice. Patient also reports that she was notified by PCP regarding abnormal thyroid test. Patient states she returned to PCP office for additional lab work, and was also told she might need a scan, however, she has not received any further instruction to date.  Sign for infusion given to [employer]. Cindy S. Brigitte Pulse BSN, RN, CCRP 03/31/2014 1:55 PM

## 2014-03-31 NOTE — Telephone Encounter (Signed)
Pt confirmed labs/ov per 10/26 POF, called and scheduled pt for referral w/[facility] and s/w pt confirming D/T....gave pt AVS.... KJ

## 2014-03-31 NOTE — Progress Notes (Signed)
  [facility] OFFICE PROGRESS NOTE   Diagnosis: Non-small cell lung cancer  INTERVAL HISTORY:   Christina Hale returns as scheduled. She completed another treatment with Avastin 03/10/2014. No bleeding or symptom of thrombosis. She reports an improved energy level. Good appetite. The neuropathy symptoms have improved. The cough has improved. She is having more "bursitis "discomfort at the hip. She would like to see Dr. Nelva Bush for a steroid injection.  Objective:  Vital signs in last 24 hours:  Blood pressure 162/66, pulse 89, temperature 98.5 F (36.9 C), temperature source Oral, resp. rate 20, height _0  (1.778 m), weight 151 lb 8 oz (68.72 kg).    HEENT: Mild erythema at the posterior left buccal mucosa near the gumline. Mild white exudate in this area. Resp: Lungs clear bilaterally Cardio: Regular rate and rhythm GI: No hepatomegaly, nontender Vascular:  No leg edema  Skin: Palms without erythema   Portacath/PICC-without erythema  Lab Results:  Lab Results  Component Value Date   WBC 9.3 03/31/2014   HGB 14.0 03/31/2014   HCT 43.3 03/31/2014   MCV 84.6 03/31/2014   PLT 113* 03/31/2014   NEUTROABS 6.3 03/31/2014    Medications: I have reviewed the patient's current medications.  Assessment/Plan: 1. Stage IV adenocarcinoma of the lung, dominant left upper lobe mass and spiculated right upper lobe mass Status post a biopsy of a left hepatic mass confirming metastatic adenocarcinoma consistent with a lung primary, ALK mutation negative, EGFR amplification suboptimal-repeat testing recommend  Staging CT scans 09/27/2013 confirmed a dominant left upper lung mass, a smaller right upper lung mass, left hepatic mass, and left external iliac node.  Cycle 1 Taxol/carboplatin and Avastin per the CTSU F8101 study 10/09/2013.  Cycle 2 Taxol/carboplatin and Avastin 10/29/2013.  Restaging CT evaluation 11/14/2013 with improvement in the lungs and liver. Left external  iliac lymph node was stable.  Cycle 3 Taxol/carboplatin and Avastin 12/02/2013  Restaging CT 01/02/2014 with a mixed response-slightly larger left upper lung mass and mediastinal lymph node, slightly smaller liver mass and iliac node  Maintenance Avastin beginning 01/06/2014  Restaging CT 03/06/2014 with a decrease in the size of the lung masses and a liver lesion 2. C. difficile colitis March 2015 3. Hyponatremia/seizure on hospital admission 08/29/2013 4. Chronic right hip "bursitis". 5. Nausea and vomiting following cycle 1 Taxol/carboplatin, the anti-emetic regimen was adjusted with cycle 2. 6. Severe neutropenia following cycle 1 Taxol/carboplatin-Neulasta added with cycle 2. 7. 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 9. Anemia. Likely secondary to chemotherapy. She received red cell transfusions 12/20/2013 and 12/21/2013. Improved. 10. Hoarseness-improved, potentially related to toxicity from chemotherapy versus tumor.    Disposition:  [name] appears to be tolerating the Avastin well. No clinical evidence of disease progression. Her performance status appears improved. Her ECoG performance status is measured at 1. The plan is to continue every three-week Avastin. She will return for an office visit in 3 weeks.  I will refer her to orthopedics to evaluate the hip discomfort and consider a steroid injection.  Betsy Coder, MD  03/31/2014  10:53 AM

## 2014-03-31 NOTE — Patient Instructions (Signed)
[facility] Discharge Instructions for Patients Receiving Chemotherapy  Today you received the following chemotherapy agents: Avastin.  To help prevent nausea and vomiting after your treatment, we encourage you to take your nausea medication: As directed.   If you develop nausea and vomiting that is not controlled by your nausea medication, call the clinic.   BELOW ARE SYMPTOMS THAT SHOULD BE REPORTED IMMEDIATELY:  *FEVER GREATER THAN 100.5 F  *CHILLS WITH OR WITHOUT FEVER  NAUSEA AND VOMITING THAT IS NOT CONTROLLED WITH YOUR NAUSEA MEDICATION  *UNUSUAL SHORTNESS OF BREATH  *UNUSUAL BRUISING OR BLEEDING  TENDERNESS IN MOUTH AND THROAT WITH OR WITHOUT PRESENCE OF ULCERS  *URINARY PROBLEMS  *BOWEL PROBLEMS  UNUSUAL RASH Items with * indicate a potential emergency and should be followed up as soon as possible.  Feel free to call the clinic you have any questions or concerns. The clinic phone number is [phone].

## 2014-04-01 LAB — URINE CULTURE

## 2014-04-07 ENCOUNTER — Telehealth: Payer: Self-pay

## 2014-04-07 NOTE — Telephone Encounter (Signed)
Tonya at [facility] called requesting records. Patient is having emergency surgery. Faxed records to [facility]

## 2014-04-09 ENCOUNTER — Other Ambulatory Visit: Payer: Self-pay | Admitting: *Deleted

## 2014-04-09 MED ORDER — HYDROCOD POLST-CHLORPHEN POLST 10-8 MG/5ML PO LQCR: 5.0000 mL | Freq: Two times a day (BID) | ORAL | Status: DC | PRN

## 2014-04-09 NOTE — Telephone Encounter (Signed)
Needs refill on her cough medicine. Would like to pick it up tomorrow.

## 2014-04-10 ENCOUNTER — Encounter: Payer: Self-pay | Admitting: Internal Medicine

## 2014-04-17 ENCOUNTER — Encounter: Payer: Self-pay | Admitting: *Deleted

## 2014-04-20 ENCOUNTER — Other Ambulatory Visit: Payer: Self-pay | Admitting: Oncology

## 2014-04-21 ENCOUNTER — Other Ambulatory Visit (HOSPITAL_BASED_OUTPATIENT_CLINIC_OR_DEPARTMENT_OTHER): Payer: BC Managed Care – PPO

## 2014-04-21 ENCOUNTER — Ambulatory Visit: Payer: BC Managed Care – PPO

## 2014-04-21 ENCOUNTER — Telehealth: Payer: Self-pay | Admitting: Oncology

## 2014-04-21 ENCOUNTER — Encounter: Payer: BC Managed Care – PPO | Admitting: *Deleted

## 2014-04-21 ENCOUNTER — Telehealth: Payer: Self-pay | Admitting: *Deleted

## 2014-04-21 ENCOUNTER — Ambulatory Visit (HOSPITAL_BASED_OUTPATIENT_CLINIC_OR_DEPARTMENT_OTHER): Payer: BC Managed Care – PPO | Admitting: Oncology

## 2014-04-21 VITALS — BP 156/71 | HR 112 | Temp 97.1°F | Resp 18 | Ht 70.0 in | Wt 158.4 lb

## 2014-04-21 DIAGNOSIS — D649 Anemia, unspecified: Secondary | ICD-10-CM

## 2014-04-21 DIAGNOSIS — C7951 Secondary malignant neoplasm of bone: Secondary | ICD-10-CM

## 2014-04-21 DIAGNOSIS — C3412 Malignant neoplasm of upper lobe, left bronchus or lung: Secondary | ICD-10-CM

## 2014-04-21 DIAGNOSIS — D709 Neutropenia, unspecified: Secondary | ICD-10-CM

## 2014-04-21 DIAGNOSIS — C787 Secondary malignant neoplasm of liver and intrahepatic bile duct: Secondary | ICD-10-CM

## 2014-04-21 LAB — COMPREHENSIVE METABOLIC PANEL (CC13)
ALT: 14 U/L (ref 0–55)
ANION GAP: 6 meq/L (ref 3–11)
AST: 14 U/L (ref 5–34)
Albumin: 3.1 g/dL — ABNORMAL LOW (ref 3.5–5.0)
Alkaline Phosphatase: 104 U/L (ref 40–150)
BUN: 11.2 mg/dL (ref 7.0–26.0)
CO2: 27 meq/L (ref 22–29)
CREATININE: 0.6 mg/dL (ref 0.6–1.1)
Calcium: 10.4 mg/dL (ref 8.4–10.4)
Chloride: 104 mEq/L (ref 98–109)
Glucose: 103 mg/dl (ref 70–140)
Potassium: 4.1 mEq/L (ref 3.5–5.1)
Sodium: 136 mEq/L (ref 136–145)
Total Bilirubin: 0.31 mg/dL (ref 0.20–1.20)
Total Protein: 7.9 g/dL (ref 6.4–8.3)

## 2014-04-21 LAB — CBC WITH DIFFERENTIAL/PLATELET
BASO%: 0.3 % (ref 0.0–2.0)
Basophils Absolute: 0 10*3/uL (ref 0.0–0.1)
EOS ABS: 0.1 10*3/uL (ref 0.0–0.5)
EOS%: 1.5 % (ref 0.0–7.0)
HCT: 41.6 % (ref 34.8–46.6)
HEMOGLOBIN: 13.6 g/dL (ref 11.6–15.9)
LYMPH#: 1.2 10*3/uL (ref 0.9–3.3)
LYMPH%: 15.5 % (ref 14.0–49.7)
MCH: 27.5 pg (ref 25.1–34.0)
MCHC: 32.7 g/dL (ref 31.5–36.0)
MCV: 84.2 fL (ref 79.5–101.0)
MONO#: 0.5 10*3/uL (ref 0.1–0.9)
MONO%: 6 % (ref 0.0–14.0)
NEUT#: 5.7 10*3/uL (ref 1.5–6.5)
NEUT%: 76.7 % (ref 38.4–76.8)
Platelets: 118 10*3/uL — ABNORMAL LOW (ref 145–400)
RBC: 4.94 10*6/uL (ref 3.70–5.45)
RDW: 15.5 % — AB (ref 11.2–14.5)
WBC: 7.5 10*3/uL (ref 3.9–10.3)

## 2014-04-21 LAB — MAGNESIUM (CC13): Magnesium: 1.8 mg/dl (ref 1.5–2.5)

## 2014-04-21 LAB — UA PROTEIN, DIPSTICK - CHCC: Protein, ur: 30 mg/dL

## 2014-04-21 LAB — LACTATE DEHYDROGENASE (CC13): LDH: 211 U/L (ref 125–245)

## 2014-04-21 NOTE — Telephone Encounter (Signed)
Gave avs & cal for Nov.Sent mess to sch tx.

## 2014-04-21 NOTE — Progress Notes (Signed)
04/21/2014 Patient in to clinic this morning for evaluation prior to receiving treatment cycle 6. Upon arrival, patient was noted to have bruising beneath her right eye. Patient reports that she had surgery for a detached retina two weeks ago. Per patient report, she called her eye doctor, Dr. Delman Cheadle on Saturday, October 31st due to sudden visual changes. She was seen immediately and diagnosed with a detached retina. She was referred to Dr. Deloria Lair for surgery, and she underwent outpatient surgery for the detached retina on Tuesday, November 3rd. Per Dr. Benay Spice, this grade 3 event was felt to be possibly related to treatment with bevacizumab. [employer] [id number] study chair Dr. Rebbeca Paul was contact via [facility], and per study chair, patient's treatment should be delayed another two weeks. Plans made for patient to return to clinic on 05/05/14 for cycle 6 treatment. Cindy S. Brigitte Pulse BSN, RN, CCRP 04/21/2014 3:50 PM

## 2014-04-21 NOTE — Progress Notes (Signed)
[facility] OFFICE PROGRESS NOTE   Diagnosis: non-small cell lung cancer  INTERVAL HISTORY:   Christina Hale returns as scheduled.she completed another treatment with Avastin 03/31/2014. No bleeding or symptom of thrombosis. The cough has improved. No neuropathy symptoms.  She developed sudden right eye patient loss on 04/05/2014. She saw Dr. Zadie Rhine and was diagnosed with a detached retina. She underwent surgical repair on 04/07/2014.the right eye vision is improving.  Objective:  Vital signs in last 24 hours:  Blood pressure 156/71, pulse 112, temperature 97.1 F (36.2 C), temperature source Oral, resp. rate 18, height _0  (1.778 m), weight 158 lb 6.4 oz (71.85 kg), SpO2 100 %.    HEENT: right conjunctival erythema, no thrush or ulcers Resp: lungs clear bilaterally Cardio: regular rate and rhythm GI: no hepatomegaly, nontender, no mass Vascular: no leg edema  Skin:small ecchymosis inferior to the right eye   Portacath/PICC-without erythema  Lab Results:  Lab Results  Component Value Date   WBC 7.5 04/21/2014   HGB 13.6 04/21/2014   HCT 41.6 04/21/2014   MCV 84.2 04/21/2014   PLT 118* 04/21/2014   NEUTROABS 5.7 04/21/2014   03/31/2014: TSH 0.001, T4 11.9  Medications: I have reviewed the patient's current medications.  Assessment/Plan: 1. Stage IV adenocarcinoma of the lung, dominant left upper lobe mass and spiculated right upper lobe mass  Status post a biopsy of a left hepatic mass confirming metastatic adenocarcinoma consistent with a lung primary, ALK mutation negative, EGFR amplification suboptimal-repeat testing recommend   Staging CT scans 09/27/2013 confirmed a dominant left upper lung mass, a smaller right upper lung mass, left hepatic mass, and left external iliac node.   Cycle 1 Taxol/carboplatin and Avastin per the CTSU N8676 study 10/09/2013.   Cycle 2 Taxol/carboplatin and Avastin 10/29/2013.   Restaging CT evaluation 11/14/2013  with improvement in the lungs and liver. Left external iliac lymph node was stable.   Cycle 3 Taxol/carboplatin and Avastin 12/02/2013   Restaging CT 01/02/2014 with a mixed response-slightly larger left upper lung mass and mediastinal lymph node, slightly smaller liver mass and iliac node   Maintenance Avastin beginning 01/06/2014   Restaging CT 03/06/2014 with a decrease in the size of the lung masses and a liver lesion 2. C. difficile colitis March 2015 3. Hyponatremia/seizure on hospital admission 08/29/2013 4. Chronic right hip "bursitis". 5. Nausea and vomiting following cycle 1 Taxol/carboplatin, the anti-emetic regimen was adjusted with cycle 2. 6. Severe neutropenia following cycle 1 Taxol/carboplatin-Neulasta added with cycle 2. 7. 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 9. Anemia. Likely secondary to chemotherapy. She received red cell transfusions 12/20/2013 and 12/21/2013. Improved. 10. Hoarseness-improved, potentially related to toxicity from chemotherapy versus tumor. 11. Right retinal detachment 04/05/2014, status post surgical repair 04/07/2014 12. Low TSH with a borderline elevated T4-etiology unclear, she does not have symptoms to suggest hyperthyroidism, we will repeat these values when she returns in 2 weeks     Disposition:  Christina Hale appear stable from an oncology standpoint. She developed a right retinal detachment 2 weeks ago. We discussed this finding with the protocol chair and he recommends continuing Avastin with a 2 week delay. I discussed the case with Dr. Zadie Rhine. He does not feel the retinal detachment is related to Avastin.  Christina Hale will return for an office visit and Avastin on 05/05/2014.  She will undergo a restaging CT evaluation after the next treatment with Avastin.  Betsy Coder, MD  04/21/2014  11:37  AM    

## 2014-04-21 NOTE — Telephone Encounter (Signed)
Per staff message and POF I have scheduled appts. Advised scheduler of appts. JMW  

## 2014-04-24 ENCOUNTER — Telehealth: Payer: Self-pay | Admitting: Oncology

## 2014-04-24 NOTE — Telephone Encounter (Signed)
s.w pt and advised on NOV and DEC appt....pt ok and aware

## 2014-05-05 ENCOUNTER — Encounter: Payer: BC Managed Care – PPO | Admitting: *Deleted

## 2014-05-05 ENCOUNTER — Other Ambulatory Visit: Payer: Self-pay | Admitting: *Deleted

## 2014-05-05 ENCOUNTER — Encounter: Payer: Self-pay | Admitting: Oncology

## 2014-05-05 ENCOUNTER — Ambulatory Visit (HOSPITAL_BASED_OUTPATIENT_CLINIC_OR_DEPARTMENT_OTHER): Payer: BC Managed Care – PPO

## 2014-05-05 ENCOUNTER — Other Ambulatory Visit (HOSPITAL_BASED_OUTPATIENT_CLINIC_OR_DEPARTMENT_OTHER): Payer: BC Managed Care – PPO

## 2014-05-05 ENCOUNTER — Ambulatory Visit (HOSPITAL_BASED_OUTPATIENT_CLINIC_OR_DEPARTMENT_OTHER): Payer: BC Managed Care – PPO | Admitting: Oncology

## 2014-05-05 ENCOUNTER — Telehealth: Payer: Self-pay | Admitting: Oncology

## 2014-05-05 VITALS — BP 154/58 | HR 113 | Temp 98.2°F | Resp 18 | Ht 70.0 in | Wt 153.2 lb

## 2014-05-05 DIAGNOSIS — G629 Polyneuropathy, unspecified: Secondary | ICD-10-CM

## 2014-05-05 DIAGNOSIS — Z006 Encounter for examination for normal comparison and control in clinical research program: Secondary | ICD-10-CM

## 2014-05-05 DIAGNOSIS — Z5112 Encounter for antineoplastic immunotherapy: Secondary | ICD-10-CM

## 2014-05-05 DIAGNOSIS — L270 Generalized skin eruption due to drugs and medicaments taken internally: Secondary | ICD-10-CM

## 2014-05-05 DIAGNOSIS — C3412 Malignant neoplasm of upper lobe, left bronchus or lung: Secondary | ICD-10-CM

## 2014-05-05 DIAGNOSIS — E0590 Thyrotoxicosis, unspecified without thyrotoxic crisis or storm: Secondary | ICD-10-CM

## 2014-05-05 DIAGNOSIS — C3411 Malignant neoplasm of upper lobe, right bronchus or lung: Secondary | ICD-10-CM

## 2014-05-05 DIAGNOSIS — C787 Secondary malignant neoplasm of liver and intrahepatic bile duct: Secondary | ICD-10-CM

## 2014-05-05 DIAGNOSIS — R21 Rash and other nonspecific skin eruption: Secondary | ICD-10-CM

## 2014-05-05 DIAGNOSIS — C7951 Secondary malignant neoplasm of bone: Secondary | ICD-10-CM

## 2014-05-05 LAB — CBC WITH DIFFERENTIAL/PLATELET
BASO%: 0.5 % (ref 0.0–2.0)
Basophils Absolute: 0 10*3/uL (ref 0.0–0.1)
EOS%: 0.6 % (ref 0.0–7.0)
Eosinophils Absolute: 0.1 10*3/uL (ref 0.0–0.5)
HCT: 43 % (ref 34.8–46.6)
HGB: 14 g/dL (ref 11.6–15.9)
LYMPH#: 1.4 10*3/uL (ref 0.9–3.3)
LYMPH%: 17 % (ref 14.0–49.7)
MCH: 27.4 pg (ref 25.1–34.0)
MCHC: 32.6 g/dL (ref 31.5–36.0)
MCV: 84 fL (ref 79.5–101.0)
MONO#: 0.4 10*3/uL (ref 0.1–0.9)
MONO%: 5.2 % (ref 0.0–14.0)
NEUT#: 6.3 10*3/uL (ref 1.5–6.5)
NEUT%: 76.7 % (ref 38.4–76.8)
Platelets: 159 10*3/uL (ref 145–400)
RBC: 5.11 10*6/uL (ref 3.70–5.45)
RDW: 14.9 % — AB (ref 11.2–14.5)
WBC: 8.2 10*3/uL (ref 3.9–10.3)

## 2014-05-05 LAB — COMPREHENSIVE METABOLIC PANEL (CC13)
ALBUMIN: 3.3 g/dL — AB (ref 3.5–5.0)
ALT: 11 U/L (ref 0–55)
AST: 16 U/L (ref 5–34)
Alkaline Phosphatase: 116 U/L (ref 40–150)
Anion Gap: 10 mEq/L (ref 3–11)
BUN: 12.4 mg/dL (ref 7.0–26.0)
CHLORIDE: 104 meq/L (ref 98–109)
CO2: 25 mEq/L (ref 22–29)
CREATININE: 0.7 mg/dL (ref 0.6–1.1)
Calcium: 10.7 mg/dL — ABNORMAL HIGH (ref 8.4–10.4)
Glucose: 128 mg/dl (ref 70–140)
POTASSIUM: 4 meq/L (ref 3.5–5.1)
SODIUM: 139 meq/L (ref 136–145)
Total Bilirubin: 0.35 mg/dL (ref 0.20–1.20)
Total Protein: 8.2 g/dL (ref 6.4–8.3)

## 2014-05-05 LAB — TSH CHCC: TSH: 0.001 m(IU)/L — ABNORMAL LOW (ref 0.308–3.960)

## 2014-05-05 LAB — LACTATE DEHYDROGENASE (CC13): LDH: 179 U/L (ref 125–245)

## 2014-05-05 LAB — T4: T4, Total: 16.7 ug/dL — ABNORMAL HIGH (ref 4.5–12.0)

## 2014-05-05 LAB — MAGNESIUM (CC13): Magnesium: 2 mg/dl (ref 1.5–2.5)

## 2014-05-05 LAB — UA PROTEIN, DIPSTICK - CHCC: PROTEIN: 30 mg/dL

## 2014-05-05 MED ORDER — HEPARIN SOD (PORK) LOCK FLUSH 100 UNIT/ML IV SOLN
500.0000 [IU] | Freq: Once | INTRAVENOUS | Status: AC | PRN
Start: 2014-05-05 — End: 2014-05-05
  Administered 2014-05-05: 500 [IU]
  Filled 2014-05-05: qty 5

## 2014-05-05 MED ORDER — SODIUM CHLORIDE 0.9 % IV SOLN
Freq: Once | INTRAVENOUS | Status: AC
  Administered 2014-05-05: 17:00:00 via INTRAVENOUS

## 2014-05-05 MED ORDER — SODIUM CHLORIDE 0.9 % IV SOLN
15.0000 mg/kg | Freq: Once | INTRAVENOUS | Status: AC
  Administered 2014-05-05: 1075 mg via INTRAVENOUS
  Filled 2014-05-05: qty 43

## 2014-05-05 MED ORDER — SODIUM CHLORIDE 0.9 % IJ SOLN
10.0000 mL | INTRAMUSCULAR | Status: DC | PRN
  Administered 2014-05-05: 10 mL
  Filled 2014-05-05: qty 10

## 2014-05-05 MED ORDER — HYDROCOD POLST-CHLORPHEN POLST 10-8 MG/5ML PO LQCR: 5.0000 mL | Freq: Two times a day (BID) | ORAL | Status: DC | PRN

## 2014-05-05 MED ORDER — SODIUM CHLORIDE 0.9 % IV SOLN
Freq: Once | INTRAVENOUS | Status: AC
  Administered 2014-05-05: 16:00:00 via INTRAVENOUS

## 2014-05-05 MED ORDER — SENNOSIDES-DOCUSATE SODIUM 8.6-50 MG PO TABS: 1.0000 | ORAL_TABLET | Freq: Every day | ORAL | Status: AC

## 2014-05-05 NOTE — Patient Instructions (Signed)
Bevacizumab injection What is this medicine? BEVACIZUMAB (be va SIZ yoo mab) is a chemotherapy drug. It targets a protein found in many cancer cell types, and halts cancer growth. This drug treats many cancers including non-small cell lung cancer, ovarian cancer, cervical cancer, and colon or rectal cancer. It is usually given with other chemotherapy drugs. This medicine may be used for other purposes; ask your health care provider or pharmacist if you have questions. COMMON BRAND NAME(S): Avastin What should I watch for while using this medicine? Your condition will be monitored carefully while you are receiving this medicine. You will need important blood work and urine testing done while you are taking this medicine. During your treatment, let your health care professional know if you have any unusual symptoms, such as difficulty breathing. This medicine may rarely cause 'gastrointestinal perforation' (holes in the stomach, intestines or colon), a serious side effect requiring surgery to repair. This medicine should be started at least 28 days following major surgery and the site of the surgery should be totally healed. Check with your doctor before scheduling dental work or surgery while you are receiving this treatment. Talk to your doctor if you have recently had surgery or if you have a wound that has not healed. Do not become pregnant while taking this medicine. Women should inform their doctor if they wish to become pregnant or think they might be pregnant. There is a potential for serious side effects to an unborn child. Talk to your health care professional or pharmacist for more information. Do not breast-feed an infant while taking this medicine. This medicine has caused ovarian failure in some women. This medicine may interfere with the ability to have a child. You should talk to your doctor or health care professional if you are concerned about your fertility. What side effects may I  notice from receiving this medicine? Side effects that you should report to your doctor or health care professional as soon as possible: -allergic reactions like skin rash, itching or hives, swelling of the face, lips, or tongue -signs of infection - fever or chills, cough, sore throat, pain or trouble passing urine -signs of decreased platelets or bleeding - bruising, pinpoint red spots on the skin, black, tarry stools, nosebleeds, blood in the urine -breathing problems -changes in vision -chest pain -confusion -jaw pain, especially after dental work -mouth sores -seizures -severe abdominal pain -severe headache -sudden numbness or weakness of the face, arm or leg -swelling of legs or ankles -symptoms of a stroke: change in mental awareness, inability to talk or move one side of the body (especially in patients with lung cancer) -trouble passing urine or change in the amount of urine -trouble speaking or understanding -trouble walking, dizziness, loss of balance or coordination Side effects that usually do not require medical attention (report to your doctor or health care professional if they continue or are bothersome): -constipation -diarrhea -dry skin -headache -loss of appetite -nausea, vomiting This list may not describe all possible side effects. Call your doctor for medical advice about side effects. You may report side effects to FDA at 1-800-FDA-1088.  [facility] Discharge Instructions for Patients Receiving Chemotherapy  Today you received the following chemotherapy agents AVASTIN  To help prevent nausea and vomiting after your treatment, we encourage you to take your nausea medication IF NEEDED   If you develop nausea and vomiting that is not controlled by your nausea medication, call the clinic.   BELOW ARE SYMPTOMS THAT SHOULD BE  REPORTED IMMEDIATELY:  *FEVER GREATER THAN 100.5 F  *CHILLS WITH OR WITHOUT FEVER  NAUSEA AND VOMITING THAT IS NOT  CONTROLLED WITH YOUR NAUSEA MEDICATION  *UNUSUAL SHORTNESS OF BREATH  *UNUSUAL BRUISING OR BLEEDING  TENDERNESS IN MOUTH AND THROAT WITH OR WITHOUT PRESENCE OF ULCERS  *URINARY PROBLEMS  *BOWEL PROBLEMS  UNUSUAL RASH Items with * indicate a potential emergency and should be followed up as soon as possible.  Feel free to call the clinic you have any questions or concerns. The clinic phone number is [phone].

## 2014-05-05 NOTE — Telephone Encounter (Signed)
gv and pritned appt sched and avs for pt for Dec and Jan 2016

## 2014-05-05 NOTE — Progress Notes (Signed)
05/05/2014 Patient in to clinic this afternoon for evaluation prior to receiving maintenance therapy treatment cycle 6. In general, patient feels well, and her right eye Is much improved in appearance, with far less bruising. She reports that vision is slightly improved, but not expected to be normal for up to six months. She has not returned to see her eye doctor and is scheduled to return next week for follow-up. Based on lab results review and history and physical by Dr. Benay Spice, patient condition is acceptable for continued treatment. Per Dr. Gearldine Shown discussion with Dr. Zadie Rhine, and per study chair instructions to wait an additional two weeks to resume treatment, from prior visit on 11/16, patient will resume treatment today. Patient and husband are in agreement with this plan. Sign for infusion given to [facility] RN. Cindy S. Brigitte Pulse BSN, RN, [place] 05/05/2014 4:33 PM

## 2014-05-05 NOTE — Progress Notes (Signed)
[facility] OFFICE PROGRESS NOTE   Diagnosis: Non-small cell lung cancer  INTERVAL HISTORY:   Ms. Kope returns as scheduled. She reports improvement in the right eye vision. The cough is also improved. She has developed a rash over the trunk. Mild bleeding with brushing her teeth. No other bleeding.  Objective:  Vital signs in last 24 hours:  Blood pressure 154/58, pulse 113, temperature 98.2 F (36.8 C), temperature source Oral, resp. rate 18, height 5' 10"  (1.778 m), weight 153 lb 3.2 oz (69.491 kg), SpO2 100 %.    HEENT: Resolving ecchymosis surrounding the right eye. Oral cavity without thrush or ulcers Resp: Lungs clear bilaterally Cardio: Regular rate and rhythm GI: No hepatomegaly, nontender Vascular: No leg edema  Skin: Erythematous plaque-like rash over the bilateral mid back and lateral chest wall with fine raised areas. No vesicles.   Portacath/PICC-without erythema  Lab Results:  Lab Results  Component Value Date   WBC 8.2 05/05/2014   HGB 14.0 05/05/2014   HCT 43.0 05/05/2014   MCV 84.0 05/05/2014   PLT 159 05/05/2014   NEUTROABS 6.3 05/05/2014   Calcium 10.7, albumin 3.3  Medications: I have reviewed the patient's current medications.  Assessment/Plan: 1. Stage IV adenocarcinoma of the lung, dominant left upper lobe mass and spiculated right upper lobe mass  Status post a biopsy of a left hepatic mass confirming metastatic adenocarcinoma consistent with a lung primary, ALK mutation negative, EGFR amplification suboptimal-repeat testing recommend   Staging CT scans 09/27/2013 confirmed a dominant left upper lung mass, a smaller right upper lung mass, left hepatic mass, and left external iliac node.   Cycle 1 Taxol/carboplatin and Avastin per the CTSU M3817 study 10/09/2013.   Cycle 2 Taxol/carboplatin and Avastin 10/29/2013.   Restaging CT evaluation 11/14/2013 with improvement in the lungs and liver. Left external iliac lymph  node was stable.   Cycle 3 Taxol/carboplatin and Avastin 12/02/2013   Restaging CT 01/02/2014 with a mixed response-slightly larger left upper lung mass and mediastinal lymph node, slightly smaller liver mass and iliac node   Maintenance Avastin beginning 01/06/2014   Restaging CT 03/06/2014 with a decrease in the size of the lung masses and a liver lesion 2. C. difficile colitis March 2015 3. Hyponatremia/seizure on hospital admission 08/29/2013 4. Chronic right hip "bursitis". 5. Nausea and vomiting following cycle 1 Taxol/carboplatin, the anti-emetic regimen was adjusted with cycle 2. 6. Severe neutropenia following cycle 1 Taxol/carboplatin-Neulasta added with cycle 2. 7. 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 9. Anemia. Likely secondary to chemotherapy. She received red cell transfusions 12/20/2013 and 12/21/2013. Improved. 10. Hoarseness-improved, potentially related to toxicity from chemotherapy versus tumor. 11. Right retinal detachment 04/05/2014, status post surgical repair 04/07/2014 12. Low TSH with a borderline elevated T4-etiology unclear, she does not have symptoms to suggest hyperthyroidism, we will repeat these values when she returns in 2 weeks 13. Mildly elevated calcium 14. Skin rash-unlikely related to treatment.   Disposition:  Her overall status appears stable. The plan is to proceed with Avastin per protocol today. The mildly elevated calcium is most likely not secondary to hypercalcemia malignancy. Her calcium has been mildly elevated in the past. She has no symptoms of hypercalcemia. The skin rash appears to be a eczema-type rash. This is most likely not related to treatment of the lung cancer.  Christina Hale will return for an office visit and chemotherapy in 3 weeks. She will undergo a restaging CT evaluation prior to  the next office visit.  Betsy Coder, MD  05/05/2014  2:30 PM

## 2014-05-07 ENCOUNTER — Other Ambulatory Visit: Payer: Self-pay | Admitting: Oncology

## 2014-05-07 DIAGNOSIS — C3410 Malignant neoplasm of upper lobe, unspecified bronchus or lung: Secondary | ICD-10-CM

## 2014-05-22 ENCOUNTER — Ambulatory Visit (HOSPITAL_COMMUNITY)
Admission: RE | Admit: 2014-05-22 | Discharge: 2014-05-22 | Disposition: A | Discharge status: Home / Self Care | Payer: BC Managed Care – PPO | Source: Ambulatory Visit | Attending: Oncology | Admitting: Oncology

## 2014-05-22 ENCOUNTER — Encounter (HOSPITAL_COMMUNITY): Payer: Self-pay

## 2014-05-22 DIAGNOSIS — C3412 Malignant neoplasm of upper lobe, left bronchus or lung: Secondary | ICD-10-CM | POA: Diagnosis present

## 2014-05-22 DIAGNOSIS — Z79899 Other long term (current) drug therapy: Secondary | ICD-10-CM | POA: Diagnosis not present

## 2014-05-23 ENCOUNTER — Encounter: Payer: Self-pay | Admitting: *Deleted

## 2014-05-23 NOTE — Addendum Note (Signed)
Addended by: Benson Norway on: 05/23/2014 02:25 PM   Modules accepted: Medications

## 2014-05-25 ENCOUNTER — Other Ambulatory Visit: Payer: Self-pay | Admitting: Oncology

## 2014-05-26 ENCOUNTER — Ambulatory Visit (HOSPITAL_BASED_OUTPATIENT_CLINIC_OR_DEPARTMENT_OTHER): Payer: BC Managed Care – PPO | Admitting: Lab

## 2014-05-26 ENCOUNTER — Telehealth: Payer: Self-pay | Admitting: Oncology

## 2014-05-26 ENCOUNTER — Encounter: Payer: BC Managed Care – PPO | Admitting: *Deleted

## 2014-05-26 ENCOUNTER — Ambulatory Visit (HOSPITAL_BASED_OUTPATIENT_CLINIC_OR_DEPARTMENT_OTHER): Payer: BC Managed Care – PPO | Admitting: Nurse Practitioner

## 2014-05-26 ENCOUNTER — Ambulatory Visit (HOSPITAL_BASED_OUTPATIENT_CLINIC_OR_DEPARTMENT_OTHER): Payer: BC Managed Care – PPO

## 2014-05-26 ENCOUNTER — Ambulatory Visit: Payer: BC Managed Care – PPO

## 2014-05-26 VITALS — BP 159/64 | HR 116 | Temp 97.8°F | Resp 19 | Ht 70.0 in | Wt 159.6 lb

## 2014-05-26 DIAGNOSIS — C3412 Malignant neoplasm of upper lobe, left bronchus or lung: Secondary | ICD-10-CM

## 2014-05-26 DIAGNOSIS — Z006 Encounter for examination for normal comparison and control in clinical research program: Secondary | ICD-10-CM

## 2014-05-26 DIAGNOSIS — C787 Secondary malignant neoplasm of liver and intrahepatic bile duct: Secondary | ICD-10-CM

## 2014-05-26 DIAGNOSIS — C3410 Malignant neoplasm of upper lobe, unspecified bronchus or lung: Secondary | ICD-10-CM

## 2014-05-26 DIAGNOSIS — C3411 Malignant neoplasm of upper lobe, right bronchus or lung: Secondary | ICD-10-CM

## 2014-05-26 DIAGNOSIS — D709 Neutropenia, unspecified: Secondary | ICD-10-CM

## 2014-05-26 DIAGNOSIS — G620 Drug-induced polyneuropathy: Secondary | ICD-10-CM

## 2014-05-26 DIAGNOSIS — R21 Rash and other nonspecific skin eruption: Secondary | ICD-10-CM

## 2014-05-26 DIAGNOSIS — D649 Anemia, unspecified: Secondary | ICD-10-CM

## 2014-05-26 DIAGNOSIS — E0590 Thyrotoxicosis, unspecified without thyrotoxic crisis or storm: Secondary | ICD-10-CM

## 2014-05-26 DIAGNOSIS — Z5112 Encounter for antineoplastic immunotherapy: Secondary | ICD-10-CM

## 2014-05-26 LAB — CBC WITH DIFFERENTIAL/PLATELET
BASO%: 0.1 % (ref 0.0–2.0)
Basophils Absolute: 0 10*3/uL (ref 0.0–0.1)
EOS%: 1.7 % (ref 0.0–7.0)
Eosinophils Absolute: 0.1 10*3/uL (ref 0.0–0.5)
HCT: 41.8 % (ref 34.8–46.6)
HGB: 13.5 g/dL (ref 11.6–15.9)
LYMPH%: 17.2 % (ref 14.0–49.7)
MCH: 27.6 pg (ref 25.1–34.0)
MCHC: 32.3 g/dL (ref 31.5–36.0)
MCV: 85.5 fL (ref 79.5–101.0)
MONO#: 0.4 10*3/uL (ref 0.1–0.9)
MONO%: 5.8 % (ref 0.0–14.0)
NEUT#: 5.4 10*3/uL (ref 1.5–6.5)
NEUT%: 75.2 % (ref 38.4–76.8)
PLATELETS: 129 10*3/uL — AB (ref 145–400)
RBC: 4.89 10*6/uL (ref 3.70–5.45)
RDW: 14.7 % — ABNORMAL HIGH (ref 11.2–14.5)
WBC: 7.2 10*3/uL (ref 3.9–10.3)
lymph#: 1.2 10*3/uL (ref 0.9–3.3)

## 2014-05-26 LAB — MAGNESIUM (CC13): Magnesium: 2 mg/dl (ref 1.5–2.5)

## 2014-05-26 LAB — COMPREHENSIVE METABOLIC PANEL (CC13)
ALT: 26 U/L (ref 0–55)
ANION GAP: 10 meq/L (ref 3–11)
AST: 20 U/L (ref 5–34)
Albumin: 3.3 g/dL — ABNORMAL LOW (ref 3.5–5.0)
Alkaline Phosphatase: 100 U/L (ref 40–150)
BILIRUBIN TOTAL: 0.29 mg/dL (ref 0.20–1.20)
BUN: 13.3 mg/dL (ref 7.0–26.0)
CO2: 25 meq/L (ref 22–29)
CREATININE: 0.7 mg/dL (ref 0.6–1.1)
Calcium: 10.3 mg/dL (ref 8.4–10.4)
Chloride: 105 mEq/L (ref 98–109)
EGFR: >90 mL/min/{1.73_m2} (ref >90)
Glucose: 131 mg/dl (ref 70–140)
Potassium: 4 mEq/L (ref 3.5–5.1)
Sodium: 140 mEq/L (ref 136–145)
Total Protein: 7.7 g/dL (ref 6.4–8.3)

## 2014-05-26 LAB — UA PROTEIN, DIPSTICK - CHCC: Protein, ur: 30 mg/dL

## 2014-05-26 LAB — TSH CHCC: TSH: 0.002 m(IU)/L — ABNORMAL LOW (ref 0.308–3.960)

## 2014-05-26 LAB — LACTATE DEHYDROGENASE (CC13): LDH: 175 U/L (ref 125–245)

## 2014-05-26 MED ORDER — SODIUM CHLORIDE 0.9 % IJ SOLN
10.0000 mL | INTRAMUSCULAR | Status: DC | PRN
  Administered 2014-05-26: 10 mL
  Filled 2014-05-26: qty 10

## 2014-05-26 MED ORDER — SODIUM CHLORIDE 0.9 % IV SOLN
Freq: Once | INTRAVENOUS | Status: AC
  Administered 2014-05-26: 15:00:00 via INTRAVENOUS

## 2014-05-26 MED ORDER — SODIUM CHLORIDE 0.9 % IV SOLN
Freq: Once | INTRAVENOUS | Status: AC
  Administered 2014-05-26: 16:00:00 via INTRAVENOUS

## 2014-05-26 MED ORDER — BEVACIZUMAB CHEMO INJECTION 400 MG/16ML
15.0000 mg/kg | Freq: Once | INTRAVENOUS | Status: AC
  Administered 2014-05-26: 1075 mg via INTRAVENOUS
  Filled 2014-05-26: qty 43

## 2014-05-26 MED ORDER — HEPARIN SOD (PORK) LOCK FLUSH 100 UNIT/ML IV SOLN
500.0000 [IU] | Freq: Once | INTRAVENOUS | Status: AC | PRN
  Administered 2014-05-26: 500 [IU]
  Filled 2014-05-26: qty 5

## 2014-05-26 NOTE — Progress Notes (Addendum)
[name]   Diagnosis:  Non-small cell lung cancer  INTERVAL HISTORY:   Christina Hale returns as scheduled. She reports a good appetite. No dysphagia. No nausea or vomiting. No constipation or diarrhea. Stable mild dyspnea on exertion. She has an occasional cough. No fever. She denies bleeding. No leg swelling or calf pain. She reports she is planning to undergo a right hip "injection" in the near future.  Objective:  Vital signs in last 24 hours:  Blood pressure 159/64, pulse 116, temperature 97.8 F (36.6 C), temperature source Oral, resp. rate 19, height 5' 10"  (1.778 m), weight 159 lb 9.6 oz (72.394 kg), SpO2 98 %.    HEENT: No thrush or ulcers. Resolving ecchymosis right eye. Resp: Lungs clear bilaterally. Cardio: Regular rate and rhythm. GI: Abdomen soft and nontender. No hepatomegaly. Vascular: No leg edema. Port-A-Cath without erythema.   Lab Results:  Lab Results  Component Value Date   WBC 7.2 05/26/2014   HGB 13.5 05/26/2014   HCT 41.8 05/26/2014   MCV 85.5 05/26/2014   PLT 129* 05/26/2014   NEUTROABS 5.4 05/26/2014    Imaging:  No results found.  Medications: I have reviewed the patient's current medications.  Assessment/Plan: 1. Stage IV adenocarcinoma of the lung, dominant left upper lobe mass and spiculated right upper lobe mass  Status post a biopsy of a left hepatic mass confirming metastatic adenocarcinoma consistent with a lung primary, ALK mutation negative, EGFR amplification suboptimal-repeat testing recommend   Staging CT scans 09/27/2013 confirmed a dominant left upper lung mass, a smaller right upper lung mass, left hepatic mass, and left external iliac node.   Cycle 1 Taxol/carboplatin and Avastin per the CTSU G8366 study 10/09/2013.   Cycle 2 Taxol/carboplatin and Avastin 10/29/2013.   Restaging CT evaluation 11/14/2013 with improvement in the lungs and liver. Left external iliac lymph node was  stable.   Cycle 3 Taxol/carboplatin and Avastin 12/02/2013   Restaging CT 01/02/2014 with a mixed response-slightly larger left upper lung mass and mediastinal lymph node, slightly smaller liver mass and iliac node   Maintenance Avastin beginning 01/06/2014   Restaging CT 03/06/2014 with a decrease in the size of the lung masses and a liver lesion  Restaging CT 05/22/2014 with a stable right upper lung nodule, decreased size of a left upper lobe lung mass, increased size of a liver lesion, stable left pelvic nodule/node. 2. C. difficile colitis March 2015 3. Hyponatremia/seizure on hospital admission 08/29/2013 4. Chronic right hip "bursitis". 5. Nausea and vomiting following cycle 1 Taxol/carboplatin, the anti-emetic regimen was adjusted with cycle 2. 6. Severe neutropenia following cycle 1 Taxol/carboplatin-Neulasta added with cycle 2. 7. 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 9. Anemia. Likely secondary to chemotherapy. She received red cell transfusions 12/20/2013 and 12/21/2013. Improved. 10. Hoarseness-improved, potentially related to toxicity from chemotherapy versus tumor. 11. Right retinal detachment 04/05/2014, status post surgical repair 04/07/2014 12. Low TSH with a borderline elevated T4-etiology unclear, she does not have symptoms to suggest hyperthyroidism. 13. Mildly elevated calcium 14. Skin rash-unlikely related to treatment.   Disposition: Christina Hale appears unchanged. The recent restaging CT scan showed stable disease per study criteria. The plan is to continue Avastin per protocol.  She understands we recommend to hold Avastin for 7 days following the hip injection procedure.  She will return for a follow-up visit in 3 weeks. She will contact the office in the interim with any problems.  ECOG performance status  measured at 1.  Patient seen with Dr. Benay Spice.  Ned Card ANP/GNP-BC   05/26/2014    3:26 PM  This was a shared visit with Ned Card. The restaging CT reveals a mixed response with overall stable disease per the protocol. The plan is to continue Avastin.  We will repeat a thyroid panel today. I will refer her to Dr. Melford Aase for further evaluation if the thyroid profile remains abnormal. I do not think this is related to the course of systemic therapy for lung cancer.  Julieanne Manson, M.D.

## 2014-05-26 NOTE — Progress Notes (Signed)
05/26/2014 Patient in to clinic today for evaluation prior to receiving maintenance treatment cycle 7. CT scan results were previously reviewed by Dr. Benay Spice, and found to reflect stable disease, based on RECIST criteria. Results shared with patient, who will continue on maintenance therapy. Based on lab results review and history and physical by Ned Card, NP, as reviewed with Dr. Benay Spice, patient condition is acceptable for continued treatment. Patient reports that she is awaiting approval and scheduling of hip injection as previously discussed with Dr. Benay Spice. Per discussion with pharmacist by Ned Card NP, it was recommended that injection occur at least seven days after treatment with bevacizumab. Patient was notified of above. Sign for infusion given to Jonelle Sports RN. Cindy S. Brigitte Pulse BSN, RN, CCRP 05/26/2014 3:27 PM

## 2014-05-26 NOTE — Patient Instructions (Signed)
[facility] Discharge Instructions for Patients Receiving Chemotherapy  Today you received the following chemotherapy agents Avastin.  To help prevent nausea and vomiting after your treatment, we encourage you to take your nausea medication as prescribed.   If you develop nausea and vomiting that is not controlled by your nausea medication, call the clinic.   BELOW ARE SYMPTOMS THAT SHOULD BE REPORTED IMMEDIATELY:  *FEVER GREATER THAN 100.5 F  *CHILLS WITH OR WITHOUT FEVER  NAUSEA AND VOMITING THAT IS NOT CONTROLLED WITH YOUR NAUSEA MEDICATION  *UNUSUAL SHORTNESS OF BREATH  *UNUSUAL BRUISING OR BLEEDING  TENDERNESS IN MOUTH AND THROAT WITH OR WITHOUT PRESENCE OF ULCERS  *URINARY PROBLEMS  *BOWEL PROBLEMS  UNUSUAL RASH Items with * indicate a potential emergency and should be followed up as soon as possible.  Feel free to call the clinic you have any questions or concerns. The clinic phone number is [phone].

## 2014-05-26 NOTE — Telephone Encounter (Signed)
Per 12/21 POF, labs/ov added for 02/01 sent msg to add chemo also and to give pt updated calendar sch.... Cherylann Banas

## 2014-05-26 NOTE — Telephone Encounter (Signed)
gv adn printed appt shced and avs for pt for Jan and FEB....sed added tx.

## 2014-05-27 LAB — T4: T4 TOTAL: 15.8 ug/dL — AB (ref 4.5–12.0)

## 2014-05-28 ENCOUNTER — Telehealth: Payer: Self-pay | Admitting: *Deleted

## 2014-05-28 NOTE — Telephone Encounter (Signed)
-----   Message from Ladell Pier, MD sent at 05/27/2014  6:24 PM EST ----- Send results to Dr. Melford Aase. She may need evaluation by endocrinology for hyperthyroidism

## 2014-05-28 NOTE — Telephone Encounter (Signed)
Labs routed to Dr. Melford Aase. Called pt with results she will follow up with PCP.

## 2014-06-10 ENCOUNTER — Other Ambulatory Visit: Payer: Self-pay

## 2014-06-10 DIAGNOSIS — E039 Hypothyroidism, unspecified: Secondary | ICD-10-CM

## 2014-06-15 ENCOUNTER — Other Ambulatory Visit: Payer: Self-pay | Admitting: Oncology

## 2014-06-16 ENCOUNTER — Telehealth: Payer: Self-pay | Admitting: Oncology

## 2014-06-16 ENCOUNTER — Other Ambulatory Visit (HOSPITAL_BASED_OUTPATIENT_CLINIC_OR_DEPARTMENT_OTHER): Payer: BC Managed Care – PPO

## 2014-06-16 ENCOUNTER — Ambulatory Visit (HOSPITAL_BASED_OUTPATIENT_CLINIC_OR_DEPARTMENT_OTHER): Payer: BC Managed Care – PPO | Admitting: Oncology

## 2014-06-16 ENCOUNTER — Ambulatory Visit (HOSPITAL_BASED_OUTPATIENT_CLINIC_OR_DEPARTMENT_OTHER): Payer: BC Managed Care – PPO

## 2014-06-16 ENCOUNTER — Encounter: Payer: BC Managed Care – PPO | Admitting: *Deleted

## 2014-06-16 ENCOUNTER — Ambulatory Visit (HOSPITAL_COMMUNITY)
Admission: RE | Admit: 2014-06-16 | Discharge: 2014-06-16 | Disposition: A | Discharge status: Home / Self Care | Payer: BC Managed Care – PPO | Source: Ambulatory Visit | Attending: Oncology | Admitting: Oncology

## 2014-06-16 VITALS — BP 152/68 | HR 107 | Temp 97.5°F | Resp 18 | Ht 70.0 in | Wt 155.3 lb

## 2014-06-16 DIAGNOSIS — Z006 Encounter for examination for normal comparison and control in clinical research program: Secondary | ICD-10-CM

## 2014-06-16 DIAGNOSIS — Z5112 Encounter for antineoplastic immunotherapy: Secondary | ICD-10-CM

## 2014-06-16 DIAGNOSIS — C3412 Malignant neoplasm of upper lobe, left bronchus or lung: Secondary | ICD-10-CM | POA: Diagnosis not present

## 2014-06-16 DIAGNOSIS — C3410 Malignant neoplasm of upper lobe, unspecified bronchus or lung: Secondary | ICD-10-CM

## 2014-06-16 LAB — CBC WITH DIFFERENTIAL/PLATELET
BASO%: 0.2 % (ref 0.0–2.0)
BASOS ABS: 0 10*3/uL (ref 0.0–0.1)
EOS ABS: 0.1 10*3/uL (ref 0.0–0.5)
EOS%: 0.8 % (ref 0.0–7.0)
HCT: 42.5 % (ref 34.8–46.6)
HGB: 14 g/dL (ref 11.6–15.9)
LYMPH#: 1.4 10*3/uL (ref 0.9–3.3)
LYMPH%: 14.9 % (ref 14.0–49.7)
MCH: 28.2 pg (ref 25.1–34.0)
MCHC: 32.9 g/dL (ref 31.5–36.0)
MCV: 85.5 fL (ref 79.5–101.0)
MONO#: 0.5 10*3/uL (ref 0.1–0.9)
MONO%: 5.9 % (ref 0.0–14.0)
NEUT%: 78.2 % — AB (ref 38.4–76.8)
NEUTROS ABS: 7.1 10*3/uL — AB (ref 1.5–6.5)
NRBC: 0 % (ref 0–0)
Platelets: 129 10*3/uL — ABNORMAL LOW (ref 145–400)
RBC: 4.97 10*6/uL (ref 3.70–5.45)
RDW: 14.2 % (ref 11.2–14.5)
WBC: 9.1 10*3/uL (ref 3.9–10.3)

## 2014-06-16 LAB — MAGNESIUM (CC13): Magnesium: 2 mg/dl (ref 1.5–2.5)

## 2014-06-16 LAB — COMPREHENSIVE METABOLIC PANEL (CC13)
ALT: 26 U/L (ref 0–55)
AST: 23 U/L (ref 5–34)
Albumin: 3.6 g/dL (ref 3.5–5.0)
Alkaline Phosphatase: 123 U/L (ref 40–150)
Anion Gap: 8 mEq/L (ref 3–11)
BILIRUBIN TOTAL: 0.44 mg/dL (ref 0.20–1.20)
BUN: 14.7 mg/dL (ref 7.0–26.0)
CO2: 28 mEq/L (ref 22–29)
CREATININE: 0.7 mg/dL (ref 0.6–1.1)
Calcium: 9.7 mg/dL (ref 8.4–10.4)
Chloride: 103 mEq/L (ref 98–109)
EGFR: >90 mL/min/{1.73_m2} (ref >90)
GLUCOSE: 109 mg/dL (ref 70–140)
Potassium: 4 mEq/L (ref 3.5–5.1)
Sodium: 138 mEq/L (ref 136–145)
Total Protein: 7.9 g/dL (ref 6.4–8.3)

## 2014-06-16 LAB — PROTEIN / CREATININE RATIO, URINE
CREATININE, URINE: 149.99 mg/dL
Protein Creatinine Ratio: 0.56 — ABNORMAL HIGH (ref 0.00–0.15)
Total Protein, Urine: 84 mg/dL

## 2014-06-16 LAB — UA PROTEIN, DIPSTICK - CHCC: Protein, ur: 100 mg/dL

## 2014-06-16 LAB — LACTATE DEHYDROGENASE (CC13): LDH: 217 U/L (ref 125–245)

## 2014-06-16 MED ORDER — SODIUM CHLORIDE 0.9 % IV SOLN
15.0000 mg/kg | Freq: Once | INTRAVENOUS | Status: AC
  Administered 2014-06-16: 1075 mg via INTRAVENOUS
  Filled 2014-06-16: qty 43

## 2014-06-16 MED ORDER — SODIUM CHLORIDE 0.9 % IJ SOLN
10.0000 mL | INTRAMUSCULAR | Status: DC | PRN
  Administered 2014-06-16: 10 mL
  Filled 2014-06-16: qty 10

## 2014-06-16 MED ORDER — HEPARIN SOD (PORK) LOCK FLUSH 100 UNIT/ML IV SOLN
500.0000 [IU] | Freq: Once | INTRAVENOUS | Status: AC | PRN
  Administered 2014-06-16: 500 [IU]
  Filled 2014-06-16: qty 5

## 2014-06-16 MED ORDER — SODIUM CHLORIDE 0.9 % IV SOLN
Freq: Once | INTRAVENOUS | Status: AC
  Administered 2014-06-16: 13:00:00 via INTRAVENOUS

## 2014-06-16 NOTE — Progress Notes (Signed)
06/16/2014 Patient in to clinic today for evaluation prior to receiving maintenance cycle 8. Overall she is doing well, with improvement in her vision following eye surgery, and significant improvement in her hip pain since receiving steroid injection. Urine protein by dipstick was elevated at 2+, therefore sample sent for stat UPC ratio. With UPC ratio result of 0.56 (i.e. less than 3.5), treatment with bevacizumab may continue. Based on lab results review and history and physical by Dr. Benay Spice, patient condition is acceptable for continued treatment. Sign for infusion given to Jesse Fall, Therapist, sports. Cindy S. Brigitte Pulse BSN, RN, CCRP 06/16/2014 5:03 PM

## 2014-06-16 NOTE — Patient Instructions (Signed)
[facility] Discharge Instructions for Patients Receiving Chemotherapy  Today you received the following chemotherapy agents; Avastin.   To help prevent nausea and vomiting after your treatment, we encourage you to take your nausea medication as directed.    If you develop nausea and vomiting that is not controlled by your nausea medication, call the clinic.   BELOW ARE SYMPTOMS THAT SHOULD BE REPORTED IMMEDIATELY:  *FEVER GREATER THAN 100.5 F  *CHILLS WITH OR WITHOUT FEVER  NAUSEA AND VOMITING THAT IS NOT CONTROLLED WITH YOUR NAUSEA MEDICATION  *UNUSUAL SHORTNESS OF BREATH  *UNUSUAL BRUISING OR BLEEDING  TENDERNESS IN MOUTH AND THROAT WITH OR WITHOUT PRESENCE OF ULCERS  *URINARY PROBLEMS  *BOWEL PROBLEMS  UNUSUAL RASH Items with * indicate a potential emergency and should be followed up as soon as possible.  Feel free to call the clinic you have any questions or concerns. The clinic phone number is [phone].

## 2014-06-16 NOTE — Telephone Encounter (Signed)
Gave avs & cal for Jan/Feb. Sent mess to sch tx.

## 2014-06-16 NOTE — Progress Notes (Addendum)
  [facility] OFFICE PROGRESS NOTE   Diagnosis: Non-small cell lung cancer  INTERVAL HISTORY:   Christina Hale returns as scheduled. She continues every three-week Avastin. Mild bleeding when she blows her nose. No symptom of venous or arterial thrombosis. The cough has improved. She is being evaluated for hyperthyroidism by Dr.Mckeown.  The right eye vision has improved.  Objective:  Vital signs in last 24 hours:  Blood pressure 152/68, pulse 107, temperature 97.5 F (36.4 C), temperature source Oral, resp. rate 18, height _0  (1.778 m), weight 155 lb 4.8 oz (70.444 kg), SpO2 97 %.    HEENT: No thrush or ulcers Resp: Lungs are bilaterally Cardio: Regular rate and rhythm GI: No hepatomegaly Vascular: No leg edema    Portacath/PICC-without erythema  Lab Results:  Lab Results  Component Value Date   WBC 9.1 06/16/2014   HGB 14.0 06/16/2014   HCT 42.5 06/16/2014   MCV 85.5 06/16/2014   PLT 129* 06/16/2014   NEUTROABS 7.1* 06/16/2014   Urine M protein-100  Medications: I have reviewed the patient's current medications.  Assessment/Plan: 1. Stage IV adenocarcinoma of the lung, dominant left upper lobe mass and spiculated right upper lobe mass  Status post a biopsy of a left hepatic mass confirming metastatic adenocarcinoma consistent with a lung primary, ALK mutation negative, EGFR amplification suboptimal-repeat testing recommend   Staging CT scans 09/27/2013 confirmed a dominant left upper lung mass, a smaller right upper lung mass, left hepatic mass, and left external iliac node.   Cycle 1 Taxol/carboplatin and Avastin per the CTSU Q0086 study 10/09/2013.   Cycle 2 Taxol/carboplatin and Avastin 10/29/2013.   Restaging CT evaluation 11/14/2013 with improvement in the lungs and liver. Left external iliac lymph node was stable.   Cycle 3 Taxol/carboplatin and Avastin 12/02/2013   Restaging CT 01/02/2014 with a mixed response-slightly larger left  upper lung mass and mediastinal lymph node, slightly smaller liver mass and iliac node   Maintenance Avastin beginning 01/06/2014   Restaging CT 03/06/2014 with a decrease in the size of the lung masses and a liver lesion  Restaging CT 05/22/2014 with a stable right upper lung nodule, decreased size of a left upper lobe lung mass, increased size of a liver lesion, stable left pelvic nodule/node. 2. C. difficile colitis March 2015 3. Hyponatremia/seizure on hospital admission 08/29/2013 4. Chronic right hip "bursitis". 5. Nausea and vomiting following cycle 1 Taxol/carboplatin, the anti-emetic regimen was adjusted with cycle 2. 6. Severe neutropenia following cycle 1 Taxol/carboplatin-Neulasta added with cycle 2. 7. 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 9. Anemia. Likely secondary to chemotherapy. She received red cell transfusions 12/20/2013 and 12/21/2013. Improved. 10. Hoarseness-improved, potentially related to toxicity from chemotherapy versus tumor. 11. Right retinal detachment 04/05/2014, status post surgical repair 04/07/2014 12. Low TSH with a borderline elevated T4-etiology unclear, she does not have symptoms to suggest hyperthyroidism. 13. Mildly elevated calcium 14. Proteinuria-urine protein-creatinine ratio today to decide on continuing Avastin  Disposition:  She appears stable. The plan is to continue Avastin per protocol if the urine protein-creatinine ratio is okay. She will undergo evaluation for the hyperthyroidism by Dr. Melford Aase.  Christina Hale will return for an office visit and Avastin in 3 weeks. She is scheduled for a restaging CT evaluation in 6 weeks.  Her ECOG performance status is 1.  Betsy Coder, MD  06/16/2014  10:54 AM

## 2014-06-19 ENCOUNTER — Ambulatory Visit (HOSPITAL_COMMUNITY)
Admission: RE | Admit: 2014-06-19 | Discharge: 2014-06-19 | Disposition: A | Discharge status: Home / Self Care | Payer: BC Managed Care – PPO | Source: Ambulatory Visit | Attending: Internal Medicine | Admitting: Internal Medicine

## 2014-06-19 DIAGNOSIS — E039 Hypothyroidism, unspecified: Secondary | ICD-10-CM

## 2014-06-23 ENCOUNTER — Encounter (HOSPITAL_COMMUNITY)
Admission: RE | Admit: 2014-06-23 | Discharge: 2014-06-23 | Disposition: A | Discharge status: Home / Self Care | Payer: BC Managed Care – PPO | Source: Ambulatory Visit | Attending: Internal Medicine | Admitting: Internal Medicine

## 2014-06-23 DIAGNOSIS — E039 Hypothyroidism, unspecified: Secondary | ICD-10-CM

## 2014-06-24 ENCOUNTER — Encounter (HOSPITAL_COMMUNITY)
Admission: RE | Admit: 2014-06-24 | Discharge: 2014-06-24 | Disposition: A | Discharge status: Home / Self Care | Payer: BC Managed Care – PPO | Source: Ambulatory Visit | Attending: Internal Medicine | Admitting: Internal Medicine

## 2014-06-24 ENCOUNTER — Ambulatory Visit (HOSPITAL_COMMUNITY): Payer: BC Managed Care – PPO

## 2014-06-24 DIAGNOSIS — E039 Hypothyroidism, unspecified: Secondary | ICD-10-CM | POA: Diagnosis not present

## 2014-06-24 MED ORDER — SODIUM IODIDE I 131 CAPSULE
9.6000 | Freq: Once | INTRAVENOUS | Status: AC | PRN
  Administered 2014-06-24: 9.6 via ORAL

## 2014-06-24 MED ORDER — SODIUM PERTECHNETATE TC 99M INJECTION
10.0000 | Freq: Once | INTRAVENOUS | Status: AC | PRN
  Administered 2014-06-24: 10 via INTRAVENOUS

## 2014-07-01 ENCOUNTER — Other Ambulatory Visit: Payer: Self-pay

## 2014-07-01 ENCOUNTER — Encounter: Payer: Self-pay | Admitting: Physician Assistant

## 2014-07-01 DIAGNOSIS — E0500 Thyrotoxicosis with diffuse goiter without thyrotoxic crisis or storm: Secondary | ICD-10-CM

## 2014-07-03 ENCOUNTER — Ambulatory Visit: Payer: Self-pay | Admitting: Physician Assistant

## 2014-07-06 ENCOUNTER — Other Ambulatory Visit: Payer: Self-pay | Admitting: Oncology

## 2014-07-07 ENCOUNTER — Ambulatory Visit (HOSPITAL_BASED_OUTPATIENT_CLINIC_OR_DEPARTMENT_OTHER): Payer: BC Managed Care – PPO

## 2014-07-07 ENCOUNTER — Other Ambulatory Visit (HOSPITAL_BASED_OUTPATIENT_CLINIC_OR_DEPARTMENT_OTHER): Payer: BC Managed Care – PPO

## 2014-07-07 ENCOUNTER — Ambulatory Visit (HOSPITAL_BASED_OUTPATIENT_CLINIC_OR_DEPARTMENT_OTHER): Payer: BC Managed Care – PPO | Admitting: Oncology

## 2014-07-07 ENCOUNTER — Telehealth: Payer: Self-pay | Admitting: Oncology

## 2014-07-07 ENCOUNTER — Encounter: Payer: BC Managed Care – PPO | Admitting: *Deleted

## 2014-07-07 ENCOUNTER — Telehealth: Payer: Self-pay | Admitting: *Deleted

## 2014-07-07 VITALS — BP 159/67 | HR 95 | Temp 97.8°F | Resp 18 | Ht 70.0 in | Wt 160.0 lb

## 2014-07-07 DIAGNOSIS — Z006 Encounter for examination for normal comparison and control in clinical research program: Secondary | ICD-10-CM

## 2014-07-07 DIAGNOSIS — C3410 Malignant neoplasm of upper lobe, unspecified bronchus or lung: Secondary | ICD-10-CM

## 2014-07-07 DIAGNOSIS — C3412 Malignant neoplasm of upper lobe, left bronchus or lung: Secondary | ICD-10-CM

## 2014-07-07 DIAGNOSIS — L271 Localized skin eruption due to drugs and medicaments taken internally: Secondary | ICD-10-CM

## 2014-07-07 DIAGNOSIS — R809 Proteinuria, unspecified: Secondary | ICD-10-CM

## 2014-07-07 DIAGNOSIS — R490 Dysphonia: Secondary | ICD-10-CM

## 2014-07-07 DIAGNOSIS — G620 Drug-induced polyneuropathy: Secondary | ICD-10-CM

## 2014-07-07 DIAGNOSIS — Z5112 Encounter for antineoplastic immunotherapy: Secondary | ICD-10-CM

## 2014-07-07 DIAGNOSIS — C787 Secondary malignant neoplasm of liver and intrahepatic bile duct: Secondary | ICD-10-CM

## 2014-07-07 LAB — COMPREHENSIVE METABOLIC PANEL (CC13)
ALK PHOS: 120 U/L (ref 40–150)
ALT: 24 U/L (ref 0–55)
AST: 21 U/L (ref 5–34)
Albumin: 3.3 g/dL — ABNORMAL LOW (ref 3.5–5.0)
Anion Gap: 9 mEq/L (ref 3–11)
BILIRUBIN TOTAL: 0.39 mg/dL (ref 0.20–1.20)
BUN: 12.2 mg/dL (ref 7.0–26.0)
CO2: 28 mEq/L (ref 22–29)
CREATININE: 0.6 mg/dL (ref 0.6–1.1)
Calcium: 9.5 mg/dL (ref 8.4–10.4)
Chloride: 104 mEq/L (ref 98–109)
EGFR: >90 mL/min/{1.73_m2} (ref >90)
GLUCOSE: 108 mg/dL (ref 70–140)
POTASSIUM: 4.3 meq/L (ref 3.5–5.1)
Sodium: 141 mEq/L (ref 136–145)
Total Protein: 7.3 g/dL (ref 6.4–8.3)

## 2014-07-07 LAB — CBC WITH DIFFERENTIAL/PLATELET
BASO%: 0.5 % (ref 0.0–2.0)
Basophils Absolute: 0 10*3/uL (ref 0.0–0.1)
EOS%: 1.5 % (ref 0.0–7.0)
Eosinophils Absolute: 0.1 10*3/uL (ref 0.0–0.5)
HCT: 40.7 % (ref 34.8–46.6)
HEMOGLOBIN: 13.1 g/dL (ref 11.6–15.9)
LYMPH%: 13.5 % — ABNORMAL LOW (ref 14.0–49.7)
MCH: 27.8 pg (ref 25.1–34.0)
MCHC: 32.2 g/dL (ref 31.5–36.0)
MCV: 86.3 fL (ref 79.5–101.0)
MONO#: 0.4 10*3/uL (ref 0.1–0.9)
MONO%: 5.8 % (ref 0.0–14.0)
NEUT#: 5.8 10*3/uL (ref 1.5–6.5)
NEUT%: 78.7 % — AB (ref 38.4–76.8)
Platelets: 131 10*3/uL — ABNORMAL LOW (ref 145–400)
RBC: 4.71 10*6/uL (ref 3.70–5.45)
RDW: 14.5 % (ref 11.2–14.5)
WBC: 7.4 10*3/uL (ref 3.9–10.3)
lymph#: 1 10*3/uL (ref 0.9–3.3)

## 2014-07-07 LAB — MAGNESIUM (CC13): Magnesium: 2 mg/dl (ref 1.5–2.5)

## 2014-07-07 LAB — LACTATE DEHYDROGENASE (CC13): LDH: 193 U/L (ref 125–245)

## 2014-07-07 LAB — UA PROTEIN, DIPSTICK - CHCC: Protein, ur: 30 mg/dL

## 2014-07-07 MED ORDER — SODIUM CHLORIDE 0.9 % IV SOLN
Freq: Once | INTRAVENOUS | Status: AC
  Administered 2014-07-07: 10:00:00 via INTRAVENOUS

## 2014-07-07 MED ORDER — SODIUM CHLORIDE 0.9 % IV SOLN
15.0000 mg/kg | Freq: Once | INTRAVENOUS | Status: AC
  Administered 2014-07-07: 1075 mg via INTRAVENOUS
  Filled 2014-07-07: qty 43

## 2014-07-07 MED ORDER — SODIUM CHLORIDE 0.9 % IJ SOLN
10.0000 mL | INTRAMUSCULAR | Status: DC | PRN
  Administered 2014-07-07: 10 mL
  Filled 2014-07-07: qty 10

## 2014-07-07 MED ORDER — HEPARIN SOD (PORK) LOCK FLUSH 100 UNIT/ML IV SOLN
500.0000 [IU] | Freq: Once | INTRAVENOUS | Status: AC | PRN
  Administered 2014-07-07: 500 [IU]
  Filled 2014-07-07: qty 5

## 2014-07-07 MED ORDER — SODIUM CHLORIDE 0.9 % IV SOLN: Freq: Once | INTRAVENOUS | Status: DC

## 2014-07-07 NOTE — Progress Notes (Addendum)
  [facility] OFFICE PROGRESS NOTE   Diagnosis: Lung cancer  INTERVAL HISTORY:   Ms. Fellows returns as scheduled. She continues every three-week Avastin. No cough. Good appetite. She reports mild worsening of the vision loss in the right eye, though this remains improved compared to when she suffered the retinal detachment. She has been diagnosed with hyperthyroidism and reports scheduling for I-131 therapy is in process.  Objective:  Vital signs in last 24 hours:  Blood pressure 159/67, pulse 95, temperature 97.8 F (36.6 C), temperature source Oral, resp. rate 18, height _0  (1.778 m), weight 160 lb (72.576 kg), SpO2 97 %.    HEENT: No thrush or ulcers Resp: Lungs clear bilaterally Cardio: Regular rate and rhythm GI: No hepatomegaly Vascular: No leg edema    Portacath/PICC-without erythema  Lab Results:  Lab Results  Component Value Date   WBC 7.4 07/07/2014   HGB 13.1 07/07/2014   HCT 40.7 07/07/2014   MCV 86.3 07/07/2014   PLT 131* 07/07/2014   NEUTROABS 5.8 07/07/2014   urine protein-30   Medications: I have reviewed the patient's current medications.  Assessment/Plan: 1. Stage IV adenocarcinoma of the lung, dominant left upper lobe mass and spiculated right upper lobe mass  Status post a biopsy of a left hepatic mass confirming metastatic adenocarcinoma consistent with a lung primary, ALK mutation negative, EGFR amplification suboptimal-repeat testing recommend   Staging CT scans 09/27/2013 confirmed a dominant left upper lung mass, a smaller right upper lung mass, left hepatic mass, and left external iliac node.   Cycle 1 Taxol/carboplatin and Avastin per the CTSU Y3338 study 10/09/2013.   Cycle 2 Taxol/carboplatin and Avastin 10/29/2013.   Restaging CT evaluation 11/14/2013 with improvement in the lungs and liver. Left external iliac lymph node was stable.   Cycle 3 Taxol/carboplatin and Avastin 12/02/2013   Restaging CT  01/02/2014 with a mixed response-slightly larger left upper lung mass and mediastinal lymph node, slightly smaller liver mass and iliac node   Maintenance Avastin beginning 01/06/2014   Restaging CT 03/06/2014 with a decrease in the size of the lung masses and a liver lesion  Restaging CT 05/22/2014 with a stable right upper lung nodule, decreased size of a left upper lobe lung mass, increased size of a liver lesion, stable left pelvic nodule/node. 2. C. difficile colitis March 2015 3. Hyponatremia/seizure on hospital admission 08/29/2013 4. Chronic right hip "bursitis". 5. Nausea and vomiting following cycle 1 Taxol/carboplatin, the anti-emetic regimen was adjusted with cycle 2. 6. Severe neutropenia following cycle 1 Taxol/carboplatin-Neulasta added with cycle 2. 7. 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 9. Anemia. Likely secondary to chemotherapy. She received red cell transfusions 12/20/2013 and 12/21/2013. Improved. 10. Hoarseness-improved, potentially related to toxicity from chemotherapy versus tumor. 11. Right retinal detachment 04/05/2014, status post surgical repair 04/07/2014 12. Low TSH with a borderline elevated T4- she has been diagnosed with hyperthyroidism 13. Proteinuria-mild-improved  Disposition:  Ms. Haskew appears stable from an oncology standpoint. The plan is to continue every three-week Avastin. She will be scheduled for a restaging CT evaluation prior to an office visit on 07/28/2014.  She will continue follow-up with Dr. Melford Aase for management of hyperthyroidism.  Her ECOG performance status is 1.  Betsy Coder, MD  07/07/2014  10:00 AM

## 2014-07-07 NOTE — Telephone Encounter (Signed)
Pt confirmed labs/ov per 02/01 POF, gave pt AVS.... KJ, sent msg to add chemo and gave pt barium for test

## 2014-07-07 NOTE — Patient Instructions (Signed)
[facility] Discharge Instructions for Patients Receiving Chemotherapy  Today you received the following chemotherapy agents Avastin  To help prevent nausea and vomiting after your treatment, we encourage you to take your nausea medication as prescribed   If you develop nausea and vomiting that is not controlled by your nausea medication, call the clinic.   BELOW ARE SYMPTOMS THAT SHOULD BE REPORTED IMMEDIATELY:  *FEVER GREATER THAN 100.5 F  *CHILLS WITH OR WITHOUT FEVER  NAUSEA AND VOMITING THAT IS NOT CONTROLLED WITH YOUR NAUSEA MEDICATION  *UNUSUAL SHORTNESS OF BREATH  *UNUSUAL BRUISING OR BLEEDING  TENDERNESS IN MOUTH AND THROAT WITH OR WITHOUT PRESENCE OF ULCERS  *URINARY PROBLEMS  *BOWEL PROBLEMS  UNUSUAL RASH Items with * indicate a potential emergency and should be followed up as soon as possible.  Feel free to call the clinic you have any questions or concerns. The clinic phone number is [phone].

## 2014-07-07 NOTE — Progress Notes (Signed)
07/07/2014 Patient in to clinic this morning for evaluation prior to receiving maintenance treatment cycle 9. Patient states she is doing well with no specific complaints today.  She does note that her vision in her affected eye (s/p retinal detachment) is slightly blurry. She is due to return to see Dr. Zadie Rhine on the 24th. Otherwise, patient has no new problems to report. Based on lab results review and history and physical by Dr. Benay Spice, patient condition is acceptable for continued treatment. Sign for infusion given to Ortencia Kick RN. Cindy S. Brigitte Pulse BSN, RN, [place] 07/07/2014 4:18 PM

## 2014-07-07 NOTE — Telephone Encounter (Signed)
Per staff message and POF I have scheduled appts. Advised scheduler of appts. JMW  

## 2014-07-21 ENCOUNTER — Encounter: Payer: Self-pay | Admitting: *Deleted

## 2014-07-21 ENCOUNTER — Other Ambulatory Visit: Payer: Self-pay | Admitting: Oncology

## 2014-07-24 ENCOUNTER — Ambulatory Visit (HOSPITAL_COMMUNITY)
Admission: RE | Admit: 2014-07-24 | Discharge: 2014-07-24 | Disposition: A | Discharge status: Home / Self Care | Payer: BC Managed Care – PPO | Source: Ambulatory Visit | Attending: Oncology | Admitting: Oncology

## 2014-07-24 DIAGNOSIS — C3410 Malignant neoplasm of upper lobe, unspecified bronchus or lung: Secondary | ICD-10-CM | POA: Insufficient documentation

## 2014-07-28 ENCOUNTER — Telehealth: Payer: Self-pay | Admitting: *Deleted

## 2014-07-28 ENCOUNTER — Ambulatory Visit (HOSPITAL_BASED_OUTPATIENT_CLINIC_OR_DEPARTMENT_OTHER): Payer: BC Managed Care – PPO

## 2014-07-28 ENCOUNTER — Telehealth: Payer: Self-pay | Admitting: Oncology

## 2014-07-28 ENCOUNTER — Ambulatory Visit (HOSPITAL_BASED_OUTPATIENT_CLINIC_OR_DEPARTMENT_OTHER): Payer: BC Managed Care – PPO | Admitting: Nurse Practitioner

## 2014-07-28 ENCOUNTER — Other Ambulatory Visit (HOSPITAL_BASED_OUTPATIENT_CLINIC_OR_DEPARTMENT_OTHER): Payer: BC Managed Care – PPO

## 2014-07-28 ENCOUNTER — Encounter: Payer: BC Managed Care – PPO | Admitting: *Deleted

## 2014-07-28 VITALS — BP 149/63 | HR 91 | Temp 97.8°F | Resp 18 | Ht 70.0 in | Wt 159.4 lb

## 2014-07-28 DIAGNOSIS — C3412 Malignant neoplasm of upper lobe, left bronchus or lung: Secondary | ICD-10-CM

## 2014-07-28 DIAGNOSIS — C3410 Malignant neoplasm of upper lobe, unspecified bronchus or lung: Secondary | ICD-10-CM

## 2014-07-28 DIAGNOSIS — Z006 Encounter for examination for normal comparison and control in clinical research program: Secondary | ICD-10-CM

## 2014-07-28 DIAGNOSIS — Z5112 Encounter for antineoplastic immunotherapy: Secondary | ICD-10-CM

## 2014-07-28 DIAGNOSIS — F329 Major depressive disorder, single episode, unspecified: Secondary | ICD-10-CM

## 2014-07-28 LAB — CBC WITH DIFFERENTIAL/PLATELET
BASO%: 0.6 % (ref 0.0–2.0)
Basophils Absolute: 0 10*3/uL (ref 0.0–0.1)
EOS ABS: 0.1 10*3/uL (ref 0.0–0.5)
EOS%: 0.9 % (ref 0.0–7.0)
HCT: 40.7 % (ref 34.8–46.6)
HGB: 13.1 g/dL (ref 11.6–15.9)
LYMPH%: 13.6 % — ABNORMAL LOW (ref 14.0–49.7)
MCH: 27.8 pg (ref 25.1–34.0)
MCHC: 32.3 g/dL (ref 31.5–36.0)
MCV: 86 fL (ref 79.5–101.0)
MONO#: 0.5 10*3/uL (ref 0.1–0.9)
MONO%: 7.4 % (ref 0.0–14.0)
NEUT#: 5.6 10*3/uL (ref 1.5–6.5)
NEUT%: 77.5 % — AB (ref 38.4–76.8)
Platelets: 121 10*3/uL — ABNORMAL LOW (ref 145–400)
RBC: 4.73 10*6/uL (ref 3.70–5.45)
RDW: 14.1 % (ref 11.2–14.5)
WBC: 7.2 10*3/uL (ref 3.9–10.3)
lymph#: 1 10*3/uL (ref 0.9–3.3)

## 2014-07-28 LAB — COMPREHENSIVE METABOLIC PANEL (CC13)
ALT: 13 U/L (ref 0–55)
AST: 17 U/L (ref 5–34)
Albumin: 3.2 g/dL — ABNORMAL LOW (ref 3.5–5.0)
Alkaline Phosphatase: 111 U/L (ref 40–150)
Anion Gap: 9 mEq/L (ref 3–11)
BUN: 11.9 mg/dL (ref 7.0–26.0)
CO2: 29 mEq/L (ref 22–29)
Calcium: 10.1 mg/dL (ref 8.4–10.4)
Chloride: 104 mEq/L (ref 98–109)
Creatinine: 0.7 mg/dL (ref 0.6–1.1)
EGFR: >90 mL/min/{1.73_m2} (ref >90)
GLUCOSE: 83 mg/dL (ref 70–140)
POTASSIUM: 4 meq/L (ref 3.5–5.1)
Sodium: 142 mEq/L (ref 136–145)
Total Bilirubin: 0.3 mg/dL (ref 0.20–1.20)
Total Protein: 7.3 g/dL (ref 6.4–8.3)

## 2014-07-28 LAB — MAGNESIUM (CC13): MAGNESIUM: 1.8 mg/dL (ref 1.5–2.5)

## 2014-07-28 LAB — LACTATE DEHYDROGENASE (CC13): LDH: 194 U/L (ref 125–245)

## 2014-07-28 LAB — UA PROTEIN, DIPSTICK - CHCC: Protein, ur: 30 mg/dL

## 2014-07-28 MED ORDER — SODIUM CHLORIDE 0.9 % IV SOLN
Freq: Once | INTRAVENOUS | Status: AC
  Administered 2014-07-28: 10:00:00 via INTRAVENOUS

## 2014-07-28 MED ORDER — SODIUM CHLORIDE 0.9 % IV SOLN
15.0000 mg/kg | Freq: Once | INTRAVENOUS | Status: AC
  Administered 2014-07-28: 1075 mg via INTRAVENOUS
  Filled 2014-07-28: qty 43

## 2014-07-28 MED ORDER — ESCITALOPRAM OXALATE 5 MG PO TABS: 5.0000 mg | ORAL_TABLET | Freq: Every day | ORAL | Status: DC

## 2014-07-28 MED ORDER — SODIUM CHLORIDE 0.9 % IJ SOLN
10.0000 mL | INTRAMUSCULAR | Status: DC | PRN
  Administered 2014-07-28: 10 mL
  Filled 2014-07-28: qty 10

## 2014-07-28 MED ORDER — SODIUM CHLORIDE 0.9 % IV SOLN
Freq: Once | INTRAVENOUS | Status: AC
  Administered 2014-07-28: 11:00:00 via INTRAVENOUS

## 2014-07-28 MED ORDER — HEPARIN SOD (PORK) LOCK FLUSH 100 UNIT/ML IV SOLN
500.0000 [IU] | Freq: Once | INTRAVENOUS | Status: AC | PRN
  Administered 2014-07-28: 500 [IU]
  Filled 2014-07-28: qty 5

## 2014-07-28 NOTE — Progress Notes (Signed)
Treatment parameters verified with research nurse prior to patient's arrival.

## 2014-07-28 NOTE — Progress Notes (Signed)
07/28/2014 Patient in to clinic today for evaluation prior to receiving maintenance therapy cycle 10. Patient has no significant complaints to report today. She states she is scheduled for thyroid ablation on April 7th. Radiology results reviewed by Dr. Benay Spice who confirms patient has stable disease per RECIST criteria, and therefore will continue protocol treatment. Based on lab results review and history and physical by Ned Card NP, patient condition is acceptable for continued treatment. Sign for infusion given to Cira Rue RN. Cindy S. Brigitte Pulse BSN, RN, [place] 07/28/2014 3:40 PM

## 2014-07-28 NOTE — Telephone Encounter (Signed)
Per staff message and POF I have scheduled appts. Advised scheduler of appts. JMW  

## 2014-07-28 NOTE — Telephone Encounter (Signed)
Pt confirmed labs/ov per 02/22 POF, gave pt AVS... KJ

## 2014-07-28 NOTE — Progress Notes (Addendum)
[facility] OFFICE PROGRESS NOTE   Diagnosis:  Lung cancer  INTERVAL HISTORY:   Christina Hale returns as scheduled. She continues every three-week Avastin. She continues to note blood with nose blowing. No shortness breath or chest pain. No leg swelling or calf pain. No abdominal pain. She has a good appetite. Her energy level is poor. She wonders if there may be a component of depression. She is interested in trying an antidepressant.She denies suicidal thoughts. She continues to have intermittent hoarseness. She has periodic pain at the left shoulder blade which she thinks may be related to lifting her grandchildren. No change in vision.  Objective:  Vital signs in last 24 hours:  Blood pressure 149/63, pulse 91, temperature 97.8 F (36.6 C), temperature source Oral, resp. rate 18, height _0  (1.778 m), weight 159 lb 6.4 oz (72.303 kg), SpO2 100 %.    HEENT: No thrush or ulcers. Lymphatics: No palpable cervical or supraclaicular lymph nodes. Resp: Lungs clear bilaterally. Cardio: Regular rate and rhythm. GI: Abdomen soft and nontender. No hepatomegaly. Vascular: No leg edema. Calves nontender.  Skin: No rash. Port-A-Cath without erythema.    Lab Results:  Lab Results  Component Value Date   WBC 7.2 07/28/2014   HGB 13.1 07/28/2014   HCT 40.7 07/28/2014   MCV 86.0 07/28/2014   PLT 121* 07/28/2014   NEUTROABS 5.6 07/28/2014    Imaging:  No results found.  Medications: I have reviewed the patient's current medications.  Assessment/Plan: 1. Stage IV adenocarcinoma of the lung, dominant left upper lobe mass and spiculated right upper lobe mass  Status post a biopsy of a left hepatic mass confirming metastatic adenocarcinoma consistent with a lung primary, ALK mutation negative, EGFR amplification suboptimal-repeat testing recommend   Staging CT scans 09/27/2013 confirmed a dominant left upper lung mass, a smaller right upper lung mass, left hepatic  mass, and left external iliac node.   Cycle 1 Taxol/carboplatin and Avastin per the CTSU V0350 study 10/09/2013.   Cycle 2 Taxol/carboplatin and Avastin 10/29/2013.   Restaging CT evaluation 11/14/2013 with improvement in the lungs and liver. Left external iliac lymph node was stable.   Cycle 3 Taxol/carboplatin and Avastin 12/02/2013   Restaging CT 01/02/2014 with a mixed response-slightly larger left upper lung mass and mediastinal lymph node, slightly smaller liver mass and iliac node   Maintenance Avastin beginning 01/06/2014   Restaging CT 03/06/2014 with a decrease in the size of the lung masses and a liver lesion  Restaging CT 05/22/2014 with a stable right upper lung nodule, decreased size of a left upper lobe lung mass, increased size of a liver lesion, stable left pelvic nodule/node.  Restaging CT 07/24/2014 with no significant change in the chest, increase in size of the mass within the left hepatic lobe, 2 retroperitoneal lymph nodes increased in size, stable appearance of peritoneal nodules. 2. C. difficile colitis March 2015 3. Hyponatremia/seizure on hospital admission 08/29/2013 4. Chronic right hip "bursitis". 5. Nausea and vomiting following cycle 1 Taxol/carboplatin, the anti-emetic regimen was adjusted with cycle 2. 6. Severe neutropenia following cycle 1 Taxol/carboplatin-Neulasta added with cycle 2. 7. 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 9. Anemia. Likely secondary to chemotherapy. She received red cell transfusions 12/20/2013 and 12/21/2013. Improved. 10. Hoarseness-improved, potentially related to toxicity from chemotherapy versus tumor. 11. Right retinal detachment 04/05/2014, status post surgical repair 04/07/2014 12. Low TSH with a borderline elevated T4- she has been diagnosed with hyperthyroidism 13. Proteinuria-mild-improved  Disposition: Christina Hale appears stable. The restaging CT  evaluation showed stable disease per study criteria. Plan to continue every 3 week Avastin.  She is exhibiting some depression symptoms. She is interested in an antidepressant. She will begin Lexapro 5 mg daily.  She reports being scheduled for radioactive iodine 09/11/2014.  She will return for a follow-up visit in 3 weeks. She will contact the office in the interim with any problems.  Patient seen with Dr. Benay Spice.    Ned Card ANP/GNP-BC   07/28/2014  9:38 AM   This was a shared visit with Ned Card. I reviewed the restaging CT findings with Christina Hale. She understands there is CT evidence of enlargement of the liver lesion and abdominal lymph nodes. The CT is consistent with "stable disease "per the protocol criteria. The plan is to continue every three-week Avastin with a restaging CT per protocol. We will consider second line chemotherapy and immunotherapy options if there is clear evidence of disease progression.  Her ECOG performance status is measured at 1.  Julieanne Manson, M.D.

## 2014-07-28 NOTE — Telephone Encounter (Signed)
Gave avs & calendar for March/April. Sent message to schedule treatment.

## 2014-07-28 NOTE — Patient Instructions (Signed)
[facility] Discharge Instructions for Patients Receiving Chemotherapy  Today you received the following chemotherapy agents Avastin  To help prevent nausea and vomiting after your treatment, we encourage you to take your nausea medication as prescribed   If you develop nausea and vomiting that is not controlled by your nausea medication, call the clinic.   BELOW ARE SYMPTOMS THAT SHOULD BE REPORTED IMMEDIATELY:  *FEVER GREATER THAN 100.5 F  *CHILLS WITH OR WITHOUT FEVER  NAUSEA AND VOMITING THAT IS NOT CONTROLLED WITH YOUR NAUSEA MEDICATION  *UNUSUAL SHORTNESS OF BREATH  *UNUSUAL BRUISING OR BLEEDING  TENDERNESS IN MOUTH AND THROAT WITH OR WITHOUT PRESENCE OF ULCERS  *URINARY PROBLEMS  *BOWEL PROBLEMS  UNUSUAL RASH Items with * indicate a potential emergency and should be followed up as soon as possible.  Feel free to call the clinic you have any questions or concerns. The clinic phone number is [phone].

## 2014-07-29 ENCOUNTER — Encounter: Payer: Self-pay | Admitting: *Deleted

## 2014-08-05 ENCOUNTER — Telehealth: Payer: Self-pay

## 2014-08-05 NOTE — Telephone Encounter (Signed)
Pt called states she just had surgery for her detached retina and then found out she has a cataract in the same eye. States she has an appt with Dr.Guild on Thursday and they wanted her to check in with Dr. Benay Spice before they proceed. Informed Dr. Benay Spice who states it is ok to proceed if needed, and to make sure she informs them she is on Avastin. Called pt back and informed her. Pt verbalized understanding and will call if any other questions or concerns.

## 2014-08-14 ENCOUNTER — Encounter: Payer: Self-pay | Admitting: *Deleted

## 2014-08-17 ENCOUNTER — Other Ambulatory Visit: Payer: Self-pay | Admitting: Oncology

## 2014-08-18 ENCOUNTER — Telehealth: Payer: Self-pay | Admitting: *Deleted

## 2014-08-18 ENCOUNTER — Encounter: Payer: BC Managed Care – PPO | Admitting: *Deleted

## 2014-08-18 ENCOUNTER — Other Ambulatory Visit: Payer: Self-pay | Admitting: *Deleted

## 2014-08-18 ENCOUNTER — Telehealth: Payer: Self-pay | Admitting: Oncology

## 2014-08-18 ENCOUNTER — Other Ambulatory Visit (HOSPITAL_BASED_OUTPATIENT_CLINIC_OR_DEPARTMENT_OTHER): Payer: BC Managed Care – PPO

## 2014-08-18 ENCOUNTER — Ambulatory Visit (HOSPITAL_BASED_OUTPATIENT_CLINIC_OR_DEPARTMENT_OTHER): Payer: BC Managed Care – PPO

## 2014-08-18 ENCOUNTER — Ambulatory Visit (HOSPITAL_BASED_OUTPATIENT_CLINIC_OR_DEPARTMENT_OTHER): Payer: BC Managed Care – PPO | Admitting: Oncology

## 2014-08-18 VITALS — BP 157/75 | HR 101 | Temp 97.6°F | Resp 19 | Ht 70.0 in | Wt 154.4 lb

## 2014-08-18 DIAGNOSIS — C787 Secondary malignant neoplasm of liver and intrahepatic bile duct: Secondary | ICD-10-CM

## 2014-08-18 DIAGNOSIS — Z006 Encounter for examination for normal comparison and control in clinical research program: Secondary | ICD-10-CM

## 2014-08-18 DIAGNOSIS — Z5112 Encounter for antineoplastic immunotherapy: Secondary | ICD-10-CM

## 2014-08-18 DIAGNOSIS — C3412 Malignant neoplasm of upper lobe, left bronchus or lung: Secondary | ICD-10-CM

## 2014-08-18 DIAGNOSIS — C3410 Malignant neoplasm of upper lobe, unspecified bronchus or lung: Secondary | ICD-10-CM

## 2014-08-18 DIAGNOSIS — F419 Anxiety disorder, unspecified: Secondary | ICD-10-CM

## 2014-08-18 LAB — COMPREHENSIVE METABOLIC PANEL (CC13)
ALT: 16 U/L (ref 0–55)
AST: 17 U/L (ref 5–34)
Albumin: 3.1 g/dL — ABNORMAL LOW (ref 3.5–5.0)
Alkaline Phosphatase: 106 U/L (ref 40–150)
Anion Gap: 11 mEq/L (ref 3–11)
BILIRUBIN TOTAL: 0.3 mg/dL (ref 0.20–1.20)
BUN: 14.1 mg/dL (ref 7.0–26.0)
CALCIUM: 10.1 mg/dL (ref 8.4–10.4)
CO2: 25 mEq/L (ref 22–29)
CREATININE: 0.7 mg/dL (ref 0.6–1.1)
Chloride: 103 mEq/L (ref 98–109)
EGFR: >90 mL/min/{1.73_m2} (ref >90)
GLUCOSE: 97 mg/dL (ref 70–140)
Potassium: 4.3 mEq/L (ref 3.5–5.1)
Sodium: 139 mEq/L (ref 136–145)
Total Protein: 7.5 g/dL (ref 6.4–8.3)

## 2014-08-18 LAB — CBC WITH DIFFERENTIAL/PLATELET
BASO%: 0.6 % (ref 0.0–2.0)
Basophils Absolute: 0 10*3/uL (ref 0.0–0.1)
EOS%: 1.8 % (ref 0.0–7.0)
Eosinophils Absolute: 0.1 10*3/uL (ref 0.0–0.5)
HCT: 42.7 % (ref 34.8–46.6)
HEMOGLOBIN: 13.7 g/dL (ref 11.6–15.9)
LYMPH%: 15 % (ref 14.0–49.7)
MCH: 27.4 pg (ref 25.1–34.0)
MCHC: 32 g/dL (ref 31.5–36.0)
MCV: 85.6 fL (ref 79.5–101.0)
MONO#: 0.5 10*3/uL (ref 0.1–0.9)
MONO%: 8.1 % (ref 0.0–14.0)
NEUT%: 74.5 % (ref 38.4–76.8)
NEUTROS ABS: 4.9 10*3/uL (ref 1.5–6.5)
Platelets: 111 10*3/uL — ABNORMAL LOW (ref 145–400)
RBC: 4.98 10*6/uL (ref 3.70–5.45)
RDW: 13.7 % (ref 11.2–14.5)
WBC: 6.5 10*3/uL (ref 3.9–10.3)
lymph#: 1 10*3/uL (ref 0.9–3.3)

## 2014-08-18 LAB — MAGNESIUM (CC13): Magnesium: 1.9 mg/dl (ref 1.5–2.5)

## 2014-08-18 LAB — UA PROTEIN, DIPSTICK - CHCC: Protein, ur: <30 mg/dL

## 2014-08-18 LAB — LACTATE DEHYDROGENASE (CC13): LDH: 193 U/L (ref 125–245)

## 2014-08-18 MED ORDER — SODIUM CHLORIDE 0.9 % IV SOLN
Freq: Once | INTRAVENOUS | Status: AC
  Administered 2014-08-18: 11:00:00 via INTRAVENOUS

## 2014-08-18 MED ORDER — SODIUM CHLORIDE 0.9 % IJ SOLN
10.0000 mL | INTRAMUSCULAR | Status: DC | PRN
  Administered 2014-08-18: 10 mL
  Filled 2014-08-18: qty 10

## 2014-08-18 MED ORDER — HEPARIN SOD (PORK) LOCK FLUSH 100 UNIT/ML IV SOLN
500.0000 [IU] | Freq: Once | INTRAVENOUS | Status: AC | PRN
  Administered 2014-08-18: 500 [IU]
  Filled 2014-08-18: qty 5

## 2014-08-18 MED ORDER — SODIUM CHLORIDE 0.9 % IV SOLN
15.0000 mg/kg | Freq: Once | INTRAVENOUS | Status: AC
  Administered 2014-08-18: 1075 mg via INTRAVENOUS
  Filled 2014-08-18: qty 43

## 2014-08-18 MED ORDER — ALPRAZOLAM 0.25 MG PO TABS: ORAL_TABLET | ORAL | Status: DC

## 2014-08-18 MED ORDER — SODIUM CHLORIDE 0.9 % IV SOLN
Freq: Once | INTRAVENOUS | Status: AC
  Administered 2014-08-18: 09:00:00 via INTRAVENOUS

## 2014-08-18 NOTE — Telephone Encounter (Signed)
Pt confirmed labs/ov per 03/14 POF, gave pt AVS.... KJ, sent msg to add chemo

## 2014-08-18 NOTE — Progress Notes (Signed)
[facility] OFFICE PROGRESS NOTE   Diagnosis: Non-small cell lung cancer  INTERVAL HISTORY:   Ms. Eliasen returns as scheduled. She continues every three-week Avastin. No bleeding or symptom of thrombosis. 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 She complains of feeling "jittery "an emotional for the past week. No fever, cough, or other specific complaint. She is scheduled for the thyroid ablation procedure on 09/11/2014. She is somnolent when she takes Xanax. Objective:  Vital signs in last 24 hours:  Blood pressure 157/75, pulse 101, temperature 97.6 F (36.4 C), temperature source Oral, resp. rate 19, height 5' 10"  (1.778 m), weight 154 lb 6.4 oz (70.035 kg), SpO2 100 %.    HEENT: No thrush or ulcers Resp: Lungs clear bilaterally Cardio: Regular rate and rhythm GI: No hepatomegaly, nontender Vascular: Leg edema   Portacath/PICC-without erythema  Lab Results:  Lab Results  Component Value Date   WBC 6.5 08/18/2014   HGB 13.7 08/18/2014   HCT 42.7 08/18/2014   MCV 85.6 08/18/2014   PLT 111* 08/18/2014   NEUTROABS 4.9 08/18/2014    Medications: I have reviewed the patient's current medications.  Assessment/Plan: 1. Stage IV adenocarcinoma of the lung, dominant left upper lobe mass and spiculated right upper lobe mass  Status post a biopsy of a left hepatic mass confirming metastatic adenocarcinoma consistent with a lung primary, ALK mutation negative, EGFR amplification suboptimal-repeat testing recommend   Staging CT scans 09/27/2013 confirmed a dominant left upper lung mass, a smaller right upper lung mass, left hepatic mass, and left external iliac node.   Cycle 1 Taxol/carboplatin and Avastin per the CTSU D1497 study 10/09/2013.   Cycle 2 Taxol/carboplatin and Avastin 10/29/2013.   Restaging CT evaluation 11/14/2013 with improvement in the lungs and liver. Left external iliac lymph node was stable.    Cycle 3 Taxol/carboplatin and Avastin 12/02/2013   Restaging CT 01/02/2014 with a mixed response-slightly larger left upper lung mass and mediastinal lymph node, slightly smaller liver mass and iliac node   Maintenance Avastin beginning 01/06/2014   Restaging CT 03/06/2014 with a decrease in the size of the lung masses and a liver lesion  Restaging CT 05/22/2014 with a stable right upper lung nodule, decreased size of a left upper lobe lung mass, increased size of a liver lesion, stable left pelvic nodule/node.  Restaging CT 07/24/2014 with no significant change in the chest, increase in size of the mass within the left hepatic lobe, 2 retroperitoneal lymph nodes increased in size, stable appearance of peritoneal nodules. 2. C. difficile colitis March 2015 3. Hyponatremia/seizure on hospital admission 08/29/2013 4. Chronic right hip "bursitis". 5. Nausea and vomiting following cycle 1 Taxol/carboplatin, the anti-emetic regimen was adjusted with cycle 2. 6. Severe neutropenia following cycle 1 Taxol/carboplatin-Neulasta added with cycle 2. 7. 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 9. Anemia. Likely secondary to chemotherapy. She received red cell transfusions 12/20/2013 and 12/21/2013. Improved. 10. Hoarseness-improved, potentially related to toxicity from chemotherapy versus tumor. 11. Right retinal detachment 04/05/2014, status post surgical repair 04/07/2014 12. Low TSH with a borderline elevated T4- she has been diagnosed with hyperthyroidism 13. Proteinuria-mild-improved Disposition:  I suspect her symptoms are related to hyperthyroidism. She does not wish to schedule the thyroid ablation at a sooner date. She will continue Xanax as needed for anxiety. The plan is to continue every three-week Avastin. She will contact us if the anxiety symptoms do not improve with  Xanax.  Her ECOG performance status is measured at  1.  Betsy Coder, MD  08/18/2014  9:04 AM

## 2014-08-18 NOTE — Progress Notes (Signed)
08/18/2014 Patient in to clinic today for evaluation prior to receiving maintenance treatment cycle 11. She reports symptoms of depression, fatigue and weight loss during this past cycle. Per patient report to Dr. Benay Spice, she only took Lexapro for one day because she didn't like the way it made her feel. Symptoms were not felt to be attributable to lung cancer or study treatment, but rather likely related to hyperthyroidism; patient is currently scheduled for ablation therapy on April 7th.  Based on lab results review and history and physical exam by Dr. Benay Spice, patient condition is felt to be acceptable for continued maintenance therapy.   Patient's husband notes that they will be on vacation June 18-25.  Sign for infusion given to Ortencia Kick RN.  Cindy S. Brigitte Pulse BSN, RN, [place] 08/18/2014 12:55 PM

## 2014-08-18 NOTE — Telephone Encounter (Signed)
Per staff message and POF I have scheduled appts. Advised scheduler of appts. JMW  

## 2014-08-18 NOTE — Telephone Encounter (Signed)
Pt requested Dr. Benay Spice to call and see if Nuclear Medicine could move her appt up from 09/11/14.  Per Dr. Benay Spice; notified pt that [facility] has re-schedule her appt for this Thursday 08/21/14.  Pt verbalized understanding "Thank you, Thank you!"

## 2014-08-18 NOTE — Patient Instructions (Signed)
[facility] Discharge Instructions for Patients Receiving Chemotherapy  Today you received the following chemotherapy agents Avastin.  To help prevent nausea and vomiting after your treatment, we encourage you to take your nausea medication as prescribed.   If you develop nausea and vomiting that is not controlled by your nausea medication, call the clinic.   BELOW ARE SYMPTOMS THAT SHOULD BE REPORTED IMMEDIATELY:  *FEVER GREATER THAN 100.5 F  *CHILLS WITH OR WITHOUT FEVER  NAUSEA AND VOMITING THAT IS NOT CONTROLLED WITH YOUR NAUSEA MEDICATION  *UNUSUAL SHORTNESS OF BREATH  *UNUSUAL BRUISING OR BLEEDING  TENDERNESS IN MOUTH AND THROAT WITH OR WITHOUT PRESENCE OF ULCERS  *URINARY PROBLEMS  *BOWEL PROBLEMS  UNUSUAL RASH Items with * indicate a potential emergency and should be followed up as soon as possible.  Feel free to call the clinic you have any questions or concerns. The clinic phone number is [phone].

## 2014-08-21 ENCOUNTER — Encounter (HOSPITAL_COMMUNITY)
Admission: RE | Admit: 2014-08-21 | Discharge: 2014-08-21 | Disposition: A | Discharge status: Home / Self Care | Payer: BC Managed Care – PPO | Source: Ambulatory Visit | Attending: Internal Medicine | Admitting: Internal Medicine

## 2014-08-21 DIAGNOSIS — E0500 Thyrotoxicosis with diffuse goiter without thyrotoxic crisis or storm: Secondary | ICD-10-CM | POA: Diagnosis present

## 2014-08-21 MED ORDER — SODIUM IODIDE I 131 CAPSULE
20.2000 | Freq: Once | INTRAVENOUS | Status: AC | PRN
  Administered 2014-08-21: 20.2 via ORAL

## 2014-08-26 ENCOUNTER — Other Ambulatory Visit: Payer: Self-pay | Admitting: Internal Medicine

## 2014-08-26 DIAGNOSIS — E032 Hypothyroidism due to medicaments and other exogenous substances: Secondary | ICD-10-CM

## 2014-09-07 ENCOUNTER — Other Ambulatory Visit: Payer: Self-pay | Admitting: Internal Medicine

## 2014-09-07 ENCOUNTER — Other Ambulatory Visit: Payer: Self-pay | Admitting: Oncology

## 2014-09-08 ENCOUNTER — Encounter: Payer: BC Managed Care – PPO | Admitting: *Deleted

## 2014-09-08 ENCOUNTER — Ambulatory Visit: Payer: BC Managed Care – PPO | Admitting: Oncology

## 2014-09-08 ENCOUNTER — Ambulatory Visit (HOSPITAL_BASED_OUTPATIENT_CLINIC_OR_DEPARTMENT_OTHER): Payer: BC Managed Care – PPO | Admitting: Oncology

## 2014-09-08 ENCOUNTER — Other Ambulatory Visit (HOSPITAL_BASED_OUTPATIENT_CLINIC_OR_DEPARTMENT_OTHER): Payer: BC Managed Care – PPO

## 2014-09-08 ENCOUNTER — Ambulatory Visit: Payer: BC Managed Care – PPO

## 2014-09-08 ENCOUNTER — Ambulatory Visit (HOSPITAL_BASED_OUTPATIENT_CLINIC_OR_DEPARTMENT_OTHER): Payer: BC Managed Care – PPO

## 2014-09-08 ENCOUNTER — Telehealth: Payer: Self-pay | Admitting: Oncology

## 2014-09-08 ENCOUNTER — Other Ambulatory Visit: Payer: BC Managed Care – PPO

## 2014-09-08 VITALS — BP 86/51 | Temp 97.6°F | Resp 17 | Ht 70.0 in | Wt 156.8 lb

## 2014-09-08 DIAGNOSIS — Z006 Encounter for examination for normal comparison and control in clinical research program: Secondary | ICD-10-CM | POA: Diagnosis not present

## 2014-09-08 DIAGNOSIS — R809 Proteinuria, unspecified: Secondary | ICD-10-CM

## 2014-09-08 DIAGNOSIS — R5381 Other malaise: Secondary | ICD-10-CM

## 2014-09-08 DIAGNOSIS — C787 Secondary malignant neoplasm of liver and intrahepatic bile duct: Secondary | ICD-10-CM

## 2014-09-08 DIAGNOSIS — C3412 Malignant neoplasm of upper lobe, left bronchus or lung: Secondary | ICD-10-CM

## 2014-09-08 DIAGNOSIS — C3410 Malignant neoplasm of upper lobe, unspecified bronchus or lung: Secondary | ICD-10-CM

## 2014-09-08 DIAGNOSIS — Z5112 Encounter for antineoplastic immunotherapy: Secondary | ICD-10-CM | POA: Diagnosis not present

## 2014-09-08 LAB — CBC WITH DIFFERENTIAL/PLATELET
BASO%: 0.7 % (ref 0.0–2.0)
BASOS ABS: 0.1 10*3/uL (ref 0.0–0.1)
EOS%: 1.5 % (ref 0.0–7.0)
Eosinophils Absolute: 0.1 10*3/uL (ref 0.0–0.5)
HEMATOCRIT: 43.8 % (ref 34.8–46.6)
HGB: 14.1 g/dL (ref 11.6–15.9)
LYMPH%: 9.9 % — AB (ref 14.0–49.7)
MCH: 27.2 pg (ref 25.1–34.0)
MCHC: 32.3 g/dL (ref 31.5–36.0)
MCV: 84.4 fL (ref 79.5–101.0)
MONO#: 0.7 10*3/uL (ref 0.1–0.9)
MONO%: 7.4 % (ref 0.0–14.0)
NEUT#: 7.3 10*3/uL — ABNORMAL HIGH (ref 1.5–6.5)
NEUT%: 80.5 % — ABNORMAL HIGH (ref 38.4–76.8)
Platelets: 155 10*3/uL (ref 145–400)
RBC: 5.19 10*6/uL (ref 3.70–5.45)
RDW: 13.6 % (ref 11.2–14.5)
WBC: 9.1 10*3/uL (ref 3.9–10.3)
lymph#: 0.9 10*3/uL (ref 0.9–3.3)

## 2014-09-08 LAB — COMPREHENSIVE METABOLIC PANEL (CC13)
ALT: 21 U/L (ref 0–55)
ANION GAP: 10 meq/L (ref 3–11)
AST: 23 U/L (ref 5–34)
Albumin: 3.3 g/dL — ABNORMAL LOW (ref 3.5–5.0)
Alkaline Phosphatase: 159 U/L — ABNORMAL HIGH (ref 40–150)
BUN: 13.4 mg/dL (ref 7.0–26.0)
CALCIUM: 10.2 mg/dL (ref 8.4–10.4)
CO2: 27 meq/L (ref 22–29)
Chloride: 101 mEq/L (ref 98–109)
Creatinine: 0.6 mg/dL (ref 0.6–1.1)
EGFR: >90 mL/min/{1.73_m2} (ref >90)
Glucose: 94 mg/dl (ref 70–140)
Potassium: 4.4 mEq/L (ref 3.5–5.1)
SODIUM: 138 meq/L (ref 136–145)
TOTAL PROTEIN: 8.3 g/dL (ref 6.4–8.3)
Total Bilirubin: 0.34 mg/dL (ref 0.20–1.20)

## 2014-09-08 LAB — MAGNESIUM (CC13): Magnesium: 2 mg/dl (ref 1.5–2.5)

## 2014-09-08 LAB — LACTATE DEHYDROGENASE (CC13): LDH: 199 U/L (ref 125–245)

## 2014-09-08 LAB — UA PROTEIN, DIPSTICK - CHCC: PROTEIN: 30 mg/dL

## 2014-09-08 MED ORDER — SODIUM CHLORIDE 0.9 % IV SOLN
Freq: Once | INTRAVENOUS | Status: AC
  Administered 2014-09-08: 11:00:00 via INTRAVENOUS

## 2014-09-08 MED ORDER — SODIUM CHLORIDE 0.9 % IJ SOLN
10.0000 mL | INTRAMUSCULAR | Status: DC | PRN
  Administered 2014-09-08: 10 mL
  Filled 2014-09-08: qty 10

## 2014-09-08 MED ORDER — HEPARIN SOD (PORK) LOCK FLUSH 100 UNIT/ML IV SOLN
500.0000 [IU] | Freq: Once | INTRAVENOUS | Status: AC | PRN
  Administered 2014-09-08: 500 [IU]
  Filled 2014-09-08: qty 5

## 2014-09-08 MED ORDER — SODIUM CHLORIDE 0.9 % IV SOLN
15.0000 mg/kg | Freq: Once | INTRAVENOUS | Status: AC
  Administered 2014-09-08: 1075 mg via INTRAVENOUS
  Filled 2014-09-08: qty 43

## 2014-09-08 MED ORDER — SODIUM CHLORIDE 0.9 % IV SOLN
Freq: Once | INTRAVENOUS | Status: AC
  Administered 2014-09-08: 12:00:00 via INTRAVENOUS

## 2014-09-08 NOTE — Telephone Encounter (Signed)
GAVE AND PRINTED APPT SCHED AND AVS FOR PT FOR aPRIL AND mAY

## 2014-09-08 NOTE — Progress Notes (Signed)
Per Dr. Benay Spice, pt may continue to take BP medication as ordered since orthostatic VS are stable. Pt voices understanding.

## 2014-09-08 NOTE — Progress Notes (Signed)
09/08/2014 Patient in to clinic today for evaluation prior to receiving maintenance therapy cycle 12. Patient states she is not feeling well due to a lot of fatigue, since undergoing thyroid ablation two weeks ago. She continues to be out of the bed most of the day and performing her usual activities, just that she feels increased fatigue. She reports that she has a follow-up appointment with Dr. Melford Aase on 4/18 regarding her thyroid treatment. She continues to have altered vision in her right eye, with a defect noted by her eye doctor, and she is due to follow up with the surgeon Dr. Zadie Rhine in August.  Based on lab results review and history and physical by Dr. Benay Spice, patient condition is acceptable for continued treatment. Sign for infusion given to Elray Buba RN. Cindy S. Brigitte Pulse BSN, RN, [place] 09/08/2014 1:46 PM

## 2014-09-08 NOTE — Progress Notes (Addendum)
[facility] OFFICE PROGRESS NOTE   Diagnosis: Lung cancer  INTERVAL HISTORY:   Ms. Scannell returns as scheduled. She continues every three-week Avastin. No bleeding or symptom of thrombosis. No cough. She has a good appetite. She underwent radioactive iodine therapy 08/21/2014. She reports progressive malaise over the past few weeks. She is scheduled to see Dr. Melford Aase to discuss thyroid hormone replacement in approximately 2 weeks. No fever.  Objective:  Vital signs in last 24 hours:  Blood pressure 86/51, temperature 97.6 F (36.4 C), temperature source Oral, resp. rate 17, height 5' 10"  (1.778 m), weight 156 lb 12.8 oz (71.124 kg), SpO2 100 %.    HEENT: No thrush or ulcers Resp: Lungs clear bilaterally Cardio: Regular rate and rhythm GI: No hepatomegaly, nontender Vascular: No leg edema  Portacath/PICC-without erythema  Lab Results:  Lab Results  Component Value Date   WBC 9.1 09/08/2014   HGB 14.1 09/08/2014   HCT 43.8 09/08/2014   MCV 84.4 09/08/2014   PLT 155 09/08/2014   NEUTROABS 7.3* 09/08/2014     Medications: I have reviewed the patient's current medications.  Assessment/Plan: 1. Stage IV adenocarcinoma of the lung, dominant left upper lobe mass and spiculated right upper lobe mass  Status post a biopsy of a left hepatic mass confirming metastatic adenocarcinoma consistent with a lung primary, ALK mutation negative, EGFR amplification suboptimal-repeat testing recommend   Staging CT scans 09/27/2013 confirmed a dominant left upper lung mass, a smaller right upper lung mass, left hepatic mass, and left external iliac node.   Cycle 1 Taxol/carboplatin and Avastin per the CTSU N1700 study 10/09/2013.   Cycle 2 Taxol/carboplatin and Avastin 10/29/2013.   Restaging CT evaluation 11/14/2013 with improvement in the lungs and liver. Left external iliac lymph node was stable.   Cycle 3 Taxol/carboplatin and Avastin 12/02/2013   Restaging  CT 01/02/2014 with a mixed response-slightly larger left upper lung mass and mediastinal lymph node, slightly smaller liver mass and iliac node   Maintenance Avastin beginning 01/06/2014   Restaging CT 03/06/2014 with a decrease in the size of the lung masses and a liver lesion  Restaging CT 05/22/2014 with a stable right upper lung nodule, decreased size of a left upper lobe lung mass, increased size of a liver lesion, stable left pelvic nodule/node.  Restaging CT 07/24/2014 with no significant change in the chest, increase in size of the mass within the left hepatic lobe, 2 retroperitoneal lymph nodes increased in size, stable appearance of peritoneal nodules. 2. C. difficile colitis March 2015 3. Hyponatremia/seizure on hospital admission 08/29/2013 4. Chronic right hip "bursitis". 5. Nausea and vomiting following cycle 1 Taxol/carboplatin, the anti-emetic regimen was adjusted with cycle 2. 6. Severe neutropenia following cycle 1 Taxol/carboplatin-Neulasta added with cycle 2. 7. 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 9. Anemia. Likely secondary to chemotherapy. She received red cell transfusions 12/20/2013 and 12/21/2013. Improved. 10. Hoarseness-improved, potentially related to toxicity from chemotherapy versus tumor. 11. Right retinal detachment 04/05/2014, status post surgical repair 04/07/2014 12. Low TSH with a borderline elevated T4- she was diagnosed with hyperthyroidism, status post radioactive iodine treatment on 08/21/2014  13. History of Proteinuria-mild   Disposition:  Her overall status appears unchanged, but she has developed malaise following the radioactive iodine therapy. She could be developing hypothyroidism. No clinical evidence for progression of lung cancer or an infection. Her blood pressure is low today. We will repeat the blood pressure and obtain orthostatic vital signs wall she  is in the chemotherapy room  today. I asked her to hold the Norvasc. Ms. Georgi will return for an office visit and restaging CT evaluation in 3 weeks. I asked her to schedule a sooner appointment with Dr. Melford Aase to discuss thyroid hormone replacement and malaise.  Her ECOG performance status is measured at 1 today.  Betsy Coder, MD  09/08/2014  10:07 AM

## 2014-09-11 ENCOUNTER — Encounter: Payer: Self-pay | Admitting: Internal Medicine

## 2014-09-11 ENCOUNTER — Ambulatory Visit (HOSPITAL_COMMUNITY): Payer: BC Managed Care – PPO

## 2014-09-11 ENCOUNTER — Ambulatory Visit (INDEPENDENT_AMBULATORY_CARE_PROVIDER_SITE_OTHER): Payer: BC Managed Care – PPO | Admitting: Internal Medicine

## 2014-09-11 VITALS — BP 152/74 | HR 104 | Temp 100.8°F | Resp 16 | Ht 70.0 in | Wt 156.0 lb

## 2014-09-11 DIAGNOSIS — I10 Essential (primary) hypertension: Secondary | ICD-10-CM

## 2014-09-11 DIAGNOSIS — J069 Acute upper respiratory infection, unspecified: Secondary | ICD-10-CM

## 2014-09-11 LAB — CBC WITH DIFFERENTIAL/PLATELET
Basophils Absolute: 0 10*3/uL (ref 0.0–0.1)
Basophils Relative: 0 % (ref 0–1)
EOS PCT: 0 % (ref 0–5)
Eosinophils Absolute: 0 10*3/uL (ref 0.0–0.7)
HEMATOCRIT: 40 % (ref 36.0–46.0)
HEMOGLOBIN: 13.6 g/dL (ref 12.0–15.0)
LYMPHS ABS: 1.1 10*3/uL (ref 0.7–4.0)
Lymphocytes Relative: 8 % — ABNORMAL LOW (ref 12–46)
MCH: 28.2 pg (ref 26.0–34.0)
MCHC: 34 g/dL (ref 30.0–36.0)
MCV: 82.8 fL (ref 78.0–100.0)
MONOS PCT: 9 % (ref 3–12)
MPV: 10.8 fL (ref 8.6–12.4)
Monocytes Absolute: 1.2 10*3/uL — ABNORMAL HIGH (ref 0.1–1.0)
Neutro Abs: 11.2 10*3/uL — ABNORMAL HIGH (ref 1.7–7.7)
Neutrophils Relative %: 83 % — ABNORMAL HIGH (ref 43–77)
Platelets: 155 10*3/uL (ref 150–400)
RBC: 4.83 MIL/uL (ref 3.87–5.11)
RDW: 13.5 % (ref 11.5–15.5)
WBC: 13.5 10*3/uL — ABNORMAL HIGH (ref 4.0–10.5)

## 2014-09-11 MED ORDER — LIDOCAINE VISCOUS 2 % MT SOLN: 20.0000 mL | OROMUCOSAL | Status: DC | PRN

## 2014-09-11 MED ORDER — ONDANSETRON HCL 4 MG PO TABS: 4.0000 mg | ORAL_TABLET | Freq: Every day | ORAL | Status: AC | PRN

## 2014-09-11 MED ORDER — LEVOFLOXACIN 750 MG PO TABS
750.0000 mg | ORAL_TABLET | Freq: Every day | ORAL | Status: DC
Start: 2014-09-11 — End: 2014-09-29

## 2014-09-11 MED ORDER — PREDNISONE 20 MG PO TABS: ORAL_TABLET | ORAL | Status: DC

## 2014-09-11 NOTE — Patient Instructions (Signed)
Upper Respiratory Infection, Adult An upper respiratory infection (URI) is also sometimes known as the common cold. The upper respiratory tract includes the nose, sinuses, throat, trachea, and bronchi. Bronchi are the airways leading to the lungs. Most people improve within 1 week, but symptoms can last up to 2 weeks. A residual cough may last even longer.  CAUSES Many different viruses can infect the tissues lining the upper respiratory tract. The tissues become irritated and inflamed and often become very moist. Mucus production is also common. A cold is contagious. You can easily spread the virus to others by oral contact. This includes kissing, sharing a glass, coughing, or sneezing. Touching your mouth or nose and then touching a surface, which is then touched by another person, can also spread the virus. SYMPTOMS  Symptoms typically develop 1 to 3 days after you come in contact with a cold virus. Symptoms vary from person to person. They may include:  Runny nose.  Sneezing.  Nasal congestion.  Sinus irritation.  Sore throat.  Loss of voice (laryngitis).  Cough.  Fatigue.  Muscle aches.  Loss of appetite.  Headache.  Low-grade fever. DIAGNOSIS  You might diagnose your own cold based on familiar symptoms, since most people get a cold 2 to 3 times a year. Your caregiver can confirm this based on your exam. Most importantly, your caregiver can check that your symptoms are not due to another disease such as strep throat, sinusitis, pneumonia, asthma, or epiglottitis. Blood tests, throat tests, and X-rays are not necessary to diagnose a common cold, but they may sometimes be helpful in excluding other more serious diseases. Your caregiver will decide if any further tests are required. RISKS AND COMPLICATIONS  You may be at risk for a more severe case of the common cold if you smoke cigarettes, have chronic heart disease (such as heart failure) or lung disease (such as asthma), or if  you have a weakened immune system. The very young and very old are also at risk for more serious infections. Bacterial sinusitis, middle ear infections, and bacterial pneumonia can complicate the common cold. The common cold can worsen asthma and chronic obstructive pulmonary disease (COPD). Sometimes, these complications can require emergency medical care and may be life-threatening. PREVENTION  The best way to protect against getting a cold is to practice good hygiene. Avoid oral or hand contact with people with cold symptoms. Wash your hands often if contact occurs. There is no clear evidence that vitamin C, vitamin E, echinacea, or exercise reduces the chance of developing a cold. However, it is always recommended to get plenty of rest and practice good nutrition. TREATMENT  Treatment is directed at relieving symptoms. There is no cure. Antibiotics are not effective, because the infection is caused by a virus, not by bacteria. Treatment may include:  Increased fluid intake. Sports drinks offer valuable electrolytes, sugars, and fluids.  Breathing heated mist or steam (vaporizer or shower).  Eating chicken soup or other clear broths, and maintaining good nutrition.  Getting plenty of rest.  Using gargles or lozenges for comfort.  Controlling fevers with ibuprofen or acetaminophen as directed by your caregiver.  Increasing usage of your inhaler if you have asthma. Zinc gel and zinc lozenges, taken in the first 24 hours of the common cold, can shorten the duration and lessen the severity of symptoms. Pain medicines may help with fever, muscle aches, and throat pain. A variety of non-prescription medicines are available to treat congestion and runny nose. Your caregiver   can make recommendations and may suggest nasal or lung inhalers for other symptoms.  HOME CARE INSTRUCTIONS   Only take over-the-counter or prescription medicines for pain, discomfort, or fever as directed by your  caregiver.  Use a warm mist humidifier or inhale steam from a shower to increase air moisture. This may keep secretions moist and make it easier to breathe.  Drink enough water and fluids to keep your urine clear or pale yellow.  Rest as needed.  Return to work when your temperature has returned to normal or as your caregiver advises. You may need to stay home longer to avoid infecting others. You can also use a face mask and careful hand washing to prevent spread of the virus. SEEK MEDICAL CARE IF:   After the first few days, you feel you are getting worse rather than better.  You need your caregiver's advice about medicines to control symptoms.  You develop chills, worsening shortness of breath, or brown or red sputum. These may be signs of pneumonia.  You develop yellow or brown nasal discharge or pain in the face, especially when you bend forward. These may be signs of sinusitis.  You develop a fever, swollen neck glands, pain with swallowing, or white areas in the back of your throat. These may be signs of strep throat. SEEK IMMEDIATE MEDICAL CARE IF:   You have a fever.  You develop severe or persistent headache, ear pain, sinus pain, or chest pain.  You develop wheezing, a prolonged cough, cough up blood, or have a change in your usual mucus (if you have chronic lung disease).  You develop sore muscles or a stiff neck. Document Released: 11/16/2000 Document Revised: 08/15/2011 Document Reviewed: 08/28/2013 Exit[employer] Patient Information 2015 [employer]. This information is not intended to replace advice given to you by your health care provider. Make sure you discuss any questions you have with your health care provider.  

## 2014-09-11 NOTE — Progress Notes (Signed)
Subjective:    Patient ID: [facility], female    DOB: September 22, 1950, 64 y.o.   MRN: [id number]  Extremity Weakness  Associated symptoms include a fever.  Sore Throat  Associated symptoms include congestion, coughing and ear pain. Pertinent negatives include no abdominal pain, diarrhea, ear discharge, shortness of breath, trouble swallowing or vomiting.  Patient is a 64 y.o. Female who presents to the office for evaluation of sore throat and fever which started on Tuesday.  She reports that she has felt slightly off balance and a mildly runny nose.  She reports that she has some jaw pain and pressure.  She reports that her throat is sore and tender to the touch.  She reports no dizziness or the sensation of the room spinning.  She recently had an infusion today.  Patient had an avastin infusion for Lung cancer on Monday.  She reports that she started a fever last night. She has not taken any medications currently other than mucinex.      Review of Systems  Constitutional: Positive for fever, chills and fatigue.  HENT: Positive for congestion, ear pain, postnasal drip, rhinorrhea, sinus pressure and sore throat. Negative for ear discharge and trouble swallowing.   Eyes: Negative for redness, itching and visual disturbance.  Respiratory: Positive for cough. Negative for chest tightness, shortness of breath and wheezing.   Cardiovascular: Negative for chest pain.  Gastrointestinal: Negative for nausea, vomiting, abdominal pain, diarrhea and constipation.  Musculoskeletal: Positive for extremity weakness.  Neurological: Positive for dizziness. Negative for light-headedness.       Objective:   Physical Exam  Constitutional: She is oriented to person, place, and time. She appears well-developed and well-nourished. No distress.  HENT:  Head: Normocephalic and atraumatic.  Right Ear: A middle ear effusion is present.  Left Ear: A middle ear effusion is present.  Nose: Mucosal edema present.  Right sinus exhibits no maxillary sinus tenderness and no frontal sinus tenderness. Left sinus exhibits no maxillary sinus tenderness and no frontal sinus tenderness.  Mouth/Throat: Uvula is midline. No trismus in the jaw. Posterior oropharyngeal edema and posterior oropharyngeal erythema present. No oropharyngeal exudate or tonsillar abscesses.  Small ulcerations on the uvula and the posterior oropharynx  Eyes: Conjunctivae and EOM are normal. Pupils are equal, round, and reactive to light. No scleral icterus.  Neck: Normal range of motion. Neck supple. No JVD present. No thyromegaly present.  Cardiovascular: Regular rhythm, normal heart sounds and intact distal pulses.  Tachycardia present.  Exam reveals no gallop and no friction rub.   No murmur heard. Pulmonary/Chest: Effort normal and breath sounds normal. No respiratory distress. She has no wheezes. She has no rales. She exhibits no tenderness.  Abdominal: Soft. Bowel sounds are normal. She exhibits no distension and no mass. There is no tenderness. There is no rebound and no guarding.  Musculoskeletal: Normal range of motion.  Lymphadenopathy:    She has no cervical adenopathy.  Neurological: She is alert and oriented to person, place, and time.  Skin: Skin is warm and dry. She is not diaphoretic.  Psychiatric: She has a normal mood and affect. Her behavior is normal. Judgment and thought content normal.  Nursing note and vitals reviewed.         Assessment & Plan:  Patient immunocompromised from avastin infusions.  Ddx includes sinusitis, viral URI,post obstructive PNA, flu.  Will treat fever with tylenol and ibuprofen as needed.  Prednisone for achiness.  Levaquin for coverage of PNA and sinusitis.  Will do blood work to help determine bacterial vs. Viral.  Viscous lidocaine for throat relief.  Patient to go to ER in 1 week if no improvement or worsening SOB, wheezing, dehydration, or any other concerning symptoms. They agree.  1.  Acute URI  - predniSONE (DELTASONE) 20 MG tablet; 3 tabs po day one, then 2 tabs daily x 4 days  Dispense: 11 tablet; Refill: 0 - ondansetron (ZOFRAN) 4 MG tablet; Take 1 tablet (4 mg total) by mouth daily as needed for nausea or vomiting.  Dispense: 30 tablet; Refill: 1 - levofloxacin (LEVAQUIN) 750 MG tablet; Take 1 tablet (750 mg total) by mouth daily. X 7 days  Dispense: 7 tablet; Refill: 0 - lidocaine (XYLOCAINE) 2 % solution; Use as directed 20 mLs in the mouth or throat as needed for mouth pain.  Dispense: 100 mL; Refill: 0 - CBC with Differential/Platelet - Basic metabolic panel - Hepatic function panel

## 2014-09-12 LAB — HEPATIC FUNCTION PANEL
ALK PHOS: 166 U/L — AB (ref 39–117)
ALT: 20 U/L (ref 0–35)
AST: 20 U/L (ref 0–37)
Albumin: 3.2 g/dL — ABNORMAL LOW (ref 3.5–5.2)
Bilirubin, Direct: 0.2 mg/dL (ref 0.0–0.3)
Indirect Bilirubin: 0.5 mg/dL (ref 0.2–1.2)
TOTAL PROTEIN: 7.4 g/dL (ref 6.0–8.3)
Total Bilirubin: 0.7 mg/dL (ref 0.2–1.2)

## 2014-09-12 LAB — BASIC METABOLIC PANEL
BUN: 16 mg/dL (ref 6–23)
CO2: 26 mEq/L (ref 19–32)
CREATININE: 0.47 mg/dL — AB (ref 0.50–1.10)
Calcium: 9.2 mg/dL (ref 8.4–10.5)
Chloride: 98 mEq/L (ref 96–112)
GLUCOSE: 103 mg/dL — AB (ref 70–99)
Potassium: 4.1 mEq/L (ref 3.5–5.3)
Sodium: 135 mEq/L (ref 135–145)

## 2014-09-13 ENCOUNTER — Encounter: Payer: Self-pay | Admitting: Internal Medicine

## 2014-09-15 ENCOUNTER — Encounter: Payer: Self-pay | Admitting: Internal Medicine

## 2014-09-16 ENCOUNTER — Encounter: Payer: Self-pay | Admitting: Internal Medicine

## 2014-09-16 ENCOUNTER — Ambulatory Visit (INDEPENDENT_AMBULATORY_CARE_PROVIDER_SITE_OTHER): Payer: BC Managed Care – PPO | Admitting: Internal Medicine

## 2014-09-16 VITALS — BP 148/66 | HR 102 | Temp 98.0°F | Resp 16 | Ht 70.0 in | Wt 158.0 lb

## 2014-09-16 DIAGNOSIS — J0140 Acute pansinusitis, unspecified: Secondary | ICD-10-CM

## 2014-09-16 DIAGNOSIS — J069 Acute upper respiratory infection, unspecified: Secondary | ICD-10-CM

## 2014-09-16 DIAGNOSIS — K1230 Oral mucositis (ulcerative), unspecified: Secondary | ICD-10-CM

## 2014-09-16 DIAGNOSIS — Z113 Encounter for screening for infections with a predominantly sexual mode of transmission: Secondary | ICD-10-CM

## 2014-09-16 MED ORDER — ONDANSETRON HCL 4 MG PO TABS: 4.0000 mg | ORAL_TABLET | Freq: Every day | ORAL | Status: DC | PRN

## 2014-09-16 MED ORDER — PREDNISONE 20 MG PO TABS: ORAL_TABLET | ORAL | Status: DC

## 2014-09-16 MED ORDER — HYDROCODONE-ACETAMINOPHEN 5-325 MG PO TABS: 1.0000 | ORAL_TABLET | Freq: Four times a day (QID) | ORAL | Status: DC | PRN

## 2014-09-16 NOTE — Patient Instructions (Signed)
Oral Mucositis Oral mucositis is a mouth condition that may develop from treatment used to cure cancer. With this condition, sores may appear on your lips, gums, tongue,and the roof or floor of your mouth. CAUSES  Oral mucositis can happen to anyone who is being treated with cancer therapies, including:  Cancer drugs (chemotherapy).  Radiation (X-ray or other high-energy rays) for head or neck cancer.  Bone marrow transplants and stem cell transplants. Oralmucositis is not caused by infection. However, the sores can become infected after they form. Infection can make oral mucositis worse. The following factors increase your risk of oral mucositis:  Poor oral hygiene.  Dental problems or oral diseases.  Smoking.  Chewing tobacco.  Drinking alcohol.  Having other diseases such as diabetes, human immunodeficiency virus (HIV), acquired immunodeficiency syndrome (AIDS), or kidney disease.  Not drinking enough water.  Having dentures that do not fit right.  Being a child. Children are more likely than adults to develop oral mucositis, but children usually heal more quickly.  Being elderly. Elderly adults are more likely to develop oral mucositis. SYMPTOMS  Symptoms vary. They may be mild or severe. Symptoms usually show up 7 to 10 days after starting treatment. Symptoms may include:   Sores in the mouth that bleed.  Color changes inside the mouth. Red, shiny areas appear.  White patches or pus in the mouth.  Pain in the mouth and throat.  Pain when talking.  Dryness and a burning feeling in the mouth.  Saliva that is dry and thick.  Trouble eating, drinking, and swallowing.  Weight loss and malnutrition. This happens because eating is a problem. DIAGNOSIS  A caregiver will check your mouth. Then, the condition is given a grade. This grading system will help your caregiver treat your condition:  Grade 1: The inside of the mouth is sore and red.  Grade 2: There  is redness in the mouth. Open sores are present. Swallowing food might be uncomfortable.  Grade 3: There are open sores. The mouth is very red. It is very hard to swallow food.  Grade 4: No food or drink can be swallowed. TREATMENT  Oral mucositis usually heals on its own. Sometimes, changes in the cancer treatment can help. Keeping the mouth as clean and germ free as possible is very important.  Medicine may ease the condition. Different types of medicine may be needed, such as:  An antibiotic to fight infection, if present.  Medicine to help mucosal cells heal more quickly.  A water-based moisturizer for your lips, if they are affected.  Methods to control pain may include:  Keeping your mouth moist. You may suck on ice chips or sugar-free frozen ice pops.  Pain relievers that are swished around in the mouth. They will make the mouth numb to ease the pain (topical anesthetics).  Specific mouth rinses.  Prescribed, medicated gels. The gel coats the mouth. This protects nerve endings and lowers pain.  Narcotic pain medicines. These are strong drugs. They may be used if pain is very bad.  Mouth care can keep the mouth as healthy as possible and help to prevent infection. Mouth care includes:  A dental checkup. Your dental caregiver will make sure you have no teeth problems that could cause infection. Try to have the dental checkup before you begin your treatment for cancer.  Brushing your teeth several times a day. Use a soft toothbrush. Change to a new brush often. Use only gentle toothpastes. Ask your caregiver what product would  be best for you. Make sure that you also floss your teeth.  Rinsing your mouth after every meal. Rinse again at bedtime. Do not use mouthwash that contains alcohol. Ask your caregiver what would be best for you. HOME CARE INSTRUCTIONS  Only take over-the-counter or prescription medicines for pain, discomfort, or fever as directed by your caregiver.  Follow the directions carefully.  Do not smoke.  Do not drink alcohol.  Eat only bland, soft foods until your mouth sores heal. Avoid sugary and acidic foods and drinks.  Ask your caregiver if you should add high-protein shakes to your diet to avoid malnutrition and weight loss.  Drink enough fluids to keep your urine clear or pale yellow.  If you have dentures, take them out often.  Continue to check your mouth every day for any signs of oral mucositis.  Keep all follow-up appointments. SEEK MEDICAL CARE IF:  You notice redness, soreness, or dryness in your mouth.  You have mouth or throat pain that makes it hard to swallow or speak. SEEK IMMEDIATE MEDICAL CARE IF:  Your pain in your mouth or throat gets worse and does not improve with pain medicine.  You have a lot of bleeding in your mouth.  You develop new, open sores in your mouth.  You notice patches of pus forming in your mouth.  You cannot swallow solid food or liquids.  You have a fever. Document Released: 01/07/2011 Document Revised: 08/15/2011 Document Reviewed: 01/07/2011 [facility] Patient Information 2015 [place], [place]. This information is not intended to replace advice given to you by your health care provider. Make sure you discuss any questions you have with your health care provider.

## 2014-09-16 NOTE — Progress Notes (Signed)
Subjective:    Patient ID: Christina Hale, female    DOB: June 11, 1950, 64 y.o.   MRN: [id number]  HPI  Patient returns to the office for evaluation of sinus pressure and pain which has been bothering her for over a week.  She has been taking prednisone, levaquin, nasonex, and viscous lidocaine.  She reports that she is still achey and is having really severe sinus pain and burning.  She reports that occasionally she has it during the day but she does have it more severely during the evening.  She reports that she is also taking tramadol and xanax at night and she still cannot sleep secondary to pain.    Review of Systems  Constitutional: Negative for fever, chills and fatigue.  HENT: Positive for congestion, postnasal drip, sinus pressure, sore throat and voice change. Negative for rhinorrhea, sneezing and trouble swallowing.   Respiratory: Negative for cough, chest tightness, shortness of breath and wheezing.   Cardiovascular: Negative for chest pain.  Neurological: Positive for dizziness, light-headedness and headaches.       Objective:   Physical Exam  Constitutional: She is oriented to person, place, and time. She appears well-developed and well-nourished. No distress.  HENT:  Head: Normocephalic and atraumatic.  Mouth/Throat: Uvula is midline. Oral lesions present. No trismus in the jaw. Posterior oropharyngeal erythema present. No oropharyngeal exudate or posterior oropharyngeal edema.  Blood in the nares.  Mucosal membranes red and irritated.  There are round vessicular type lesion in the throat that are visible on uvula.  Eyes: Conjunctivae and EOM are normal. Pupils are equal, round, and reactive to light. No scleral icterus.  Neck: Normal range of motion. Neck supple. No JVD present. No thyromegaly present.  Cardiovascular: Normal rate, regular rhythm, normal heart sounds and intact distal pulses.  Exam reveals no gallop and no friction rub.   No murmur heard. Pulmonary/Chest:  Effort normal and breath sounds normal. No respiratory distress. She has no wheezes. She has no rales. She exhibits no tenderness.  Abdominal: Soft. Bowel sounds are normal. She exhibits no distension and no mass. There is no tenderness. There is no rebound and no guarding.  Musculoskeletal: Normal range of motion.  Lymphadenopathy:    She has no cervical adenopathy.  Neurological: She is alert and oriented to person, place, and time.  Skin: Skin is warm and dry. She is not diaphoretic.  Psychiatric: She has a normal mood and affect. Her behavior is normal. Judgment and thought content normal.  Nursing note and vitals reviewed.         Filed Vitals:   09/16/14 1354  BP: 148/66  Pulse: 102  Temp: 98 F (36.7 C)  Resp: 16         Assessment & Plan:   1. Mucositis -possible HSV vs. Chemotherapy induced vs. Possible candidal infection which is less likely based on exam  - HYDROcodone-acetaminophen (NORCO) 5-325 MG per tablet; Take 1 tablet by mouth every 6 (six) hours as needed for moderate pain or severe pain.  Dispense: 20 tablet; Refill: 0 - ondansetron (ZOFRAN) 4 MG tablet; Take 1 tablet (4 mg total) by mouth daily as needed for nausea or vomiting.  Dispense: 30 tablet; Refill: 1 - HSV(herpes simplex vrs) 1+2 ab-IgG -if no improvement will need referral to ENT and to see Dr. Learta Codding.    2. Acute pansinusitis, recurrence not specified  - predniSONE (DELTASONE) 20 MG tablet; 3 tabs po day one, then 2 tabs daily x 4 days  Dispense:  11 tablet; Refill: 0  3. Acute URI  - predniSONE (DELTASONE) 20 MG tablet; 3 tabs po day one, then 2 tabs daily x 4 days  Dispense: 11 tablet; Refill: 0

## 2014-09-17 LAB — HSV(HERPES SIMPLEX VRS) I + II AB-IGG
HSV 1 Glycoprotein G Ab, IgG: <0.1 IV
HSV 2 Glycoprotein G Ab, IgG: <0.1 IV

## 2014-09-18 ENCOUNTER — Ambulatory Visit (HOSPITAL_BASED_OUTPATIENT_CLINIC_OR_DEPARTMENT_OTHER): Payer: BC Managed Care – PPO | Admitting: Nurse Practitioner

## 2014-09-18 ENCOUNTER — Telehealth: Payer: Self-pay | Admitting: *Deleted

## 2014-09-18 ENCOUNTER — Ambulatory Visit (HOSPITAL_COMMUNITY)
Admission: RE | Admit: 2014-09-18 | Discharge: 2014-09-18 | Disposition: A | Discharge status: Home / Self Care | Payer: BC Managed Care – PPO | Source: Ambulatory Visit | Attending: Oncology | Admitting: Oncology

## 2014-09-18 ENCOUNTER — Other Ambulatory Visit: Payer: Self-pay | Admitting: *Deleted

## 2014-09-18 ENCOUNTER — Other Ambulatory Visit (HOSPITAL_COMMUNITY)
Admission: RE | Admit: 2014-09-18 | Discharge: 2014-09-18 | Disposition: A | Discharge status: Home / Self Care | Payer: BC Managed Care – PPO | Source: Ambulatory Visit | Attending: Oncology | Admitting: Oncology

## 2014-09-18 ENCOUNTER — Encounter: Payer: Self-pay | Admitting: Nurse Practitioner

## 2014-09-18 ENCOUNTER — Ambulatory Visit (HOSPITAL_BASED_OUTPATIENT_CLINIC_OR_DEPARTMENT_OTHER): Payer: BC Managed Care – PPO

## 2014-09-18 ENCOUNTER — Ambulatory Visit: Payer: Self-pay | Admitting: Internal Medicine

## 2014-09-18 VITALS — BP 148/55 | HR 106 | Temp 98.5°F | Resp 18 | Ht 70.0 in | Wt 155.2 lb

## 2014-09-18 DIAGNOSIS — C3412 Malignant neoplasm of upper lobe, left bronchus or lung: Secondary | ICD-10-CM

## 2014-09-18 DIAGNOSIS — R070 Pain in throat: Secondary | ICD-10-CM

## 2014-09-18 DIAGNOSIS — E630 Essential fatty acid [EFA] deficiency: Secondary | ICD-10-CM

## 2014-09-18 DIAGNOSIS — E860 Dehydration: Secondary | ICD-10-CM | POA: Diagnosis not present

## 2014-09-18 DIAGNOSIS — C3410 Malignant neoplasm of upper lobe, unspecified bronchus or lung: Secondary | ICD-10-CM

## 2014-09-18 DIAGNOSIS — K209 Esophagitis, unspecified without bleeding: Secondary | ICD-10-CM | POA: Insufficient documentation

## 2014-09-18 DIAGNOSIS — E8809 Other disorders of plasma-protein metabolism, not elsewhere classified: Secondary | ICD-10-CM | POA: Insufficient documentation

## 2014-09-18 DIAGNOSIS — Z87891 Personal history of nicotine dependence: Secondary | ICD-10-CM | POA: Insufficient documentation

## 2014-09-18 DIAGNOSIS — R630 Anorexia: Secondary | ICD-10-CM | POA: Diagnosis not present

## 2014-09-18 DIAGNOSIS — J329 Chronic sinusitis, unspecified: Secondary | ICD-10-CM | POA: Diagnosis not present

## 2014-09-18 DIAGNOSIS — C787 Secondary malignant neoplasm of liver and intrahepatic bile duct: Secondary | ICD-10-CM

## 2014-09-18 LAB — CBC WITH DIFFERENTIAL/PLATELET
BASO%: 0.1 % (ref 0.0–2.0)
BASOS ABS: 0 10*3/uL (ref 0.0–0.1)
EOS%: 0.7 % (ref 0.0–7.0)
Eosinophils Absolute: 0.1 10*3/uL (ref 0.0–0.5)
HCT: 40.4 % (ref 34.8–46.6)
HGB: 13.3 g/dL (ref 11.6–15.9)
LYMPH#: 0.7 10*3/uL — AB (ref 0.9–3.3)
LYMPH%: 5.3 % — ABNORMAL LOW (ref 14.0–49.7)
MCH: 28.1 pg (ref 25.1–34.0)
MCHC: 32.9 g/dL (ref 31.5–36.0)
MCV: 85.4 fL (ref 79.5–101.0)
MONO#: 0.6 10*3/uL (ref 0.1–0.9)
MONO%: 4.5 % (ref 0.0–14.0)
NEUT#: 11.4 10*3/uL — ABNORMAL HIGH (ref 1.5–6.5)
NEUT%: 89.4 % — ABNORMAL HIGH (ref 38.4–76.8)
NRBC: 0 % (ref 0–0)
Platelets: 107 10*3/uL — ABNORMAL LOW (ref 145–400)
RBC: 4.73 10*6/uL (ref 3.70–5.45)
RDW: 14.3 % (ref 11.2–14.5)
WBC: 12.8 10*3/uL — ABNORMAL HIGH (ref 3.9–10.3)

## 2014-09-18 LAB — COMPREHENSIVE METABOLIC PANEL
ALT: 17 U/L (ref 0–35)
ANION GAP: 10 (ref 5–15)
AST: 13 U/L (ref 0–37)
Albumin: 2.6 g/dL — ABNORMAL LOW (ref 3.5–5.2)
Alkaline Phosphatase: 137 U/L — ABNORMAL HIGH (ref 39–117)
BILIRUBIN TOTAL: 0.7 mg/dL (ref 0.3–1.2)
BUN: 20 mg/dL (ref 6–23)
CO2: 29 mmol/L (ref 19–32)
Calcium: 9.4 mg/dL (ref 8.4–10.5)
Chloride: 97 mmol/L (ref 96–112)
Creatinine, Ser: 0.47 mg/dL — ABNORMAL LOW (ref 0.50–1.10)
GFR calc Af Amer: >90 mL/min (ref >90)
GFR calc non Af Amer: >90 mL/min (ref >90)
Glucose, Bld: 119 mg/dL — ABNORMAL HIGH (ref 70–99)
POTASSIUM: 3.9 mmol/L (ref 3.5–5.1)
Sodium: 136 mmol/L (ref 135–145)
TOTAL PROTEIN: 7.5 g/dL (ref 6.0–8.3)

## 2014-09-18 MED ORDER — SODIUM CHLORIDE 0.9 % IJ SOLN: 10.0000 mL | INTRAMUSCULAR | Status: DC | PRN

## 2014-09-18 MED ORDER — AMOXICILLIN-POT CLAVULANATE 875-125 MG PO TABS: 1.0000 | ORAL_TABLET | Freq: Two times a day (BID) | ORAL | Status: DC

## 2014-09-18 MED ORDER — SUCRALFATE 1 GM/10ML PO SUSP: 1.0000 g | Freq: Three times a day (TID) | ORAL | Status: DC

## 2014-09-18 MED ORDER — SODIUM CHLORIDE 0.9 % IV SOLN
INTRAVENOUS | Status: AC
  Administered 2014-09-18: 14:00:00 via INTRAVENOUS

## 2014-09-18 MED ORDER — HEPARIN SOD (PORK) LOCK FLUSH 100 UNIT/ML IV SOLN: 500.0000 [IU] | INTRAVENOUS | Status: DC | PRN

## 2014-09-18 NOTE — Assessment & Plan Note (Signed)
Patient continues to complain of a chronic sore throat for the past 1-2 weeks.  She has seen her primary care provider 2 different times for the same complaint.  She reports completing Levaquin antibiotics just just today.  She started her second prednisone taper pack just yesterday as well.  She continues with Magic mouthwash with lidocaine and viscous lidocaine with only minimal relief.  Patient obtained a herpetic swab test of her throat while at her primary care office this week as well; and this was negative.  On exam-patient with erythema to posterior throat; and some exudate/white patches to the right side of her posterior oropharynx.  Patient observed managing all secretions with no difficulty whatsoever.  Was able to obtain a strep test throat swab; which was negative as well.  Advised patient would prescribe Augmentin for questionable sinusitis symptoms.  Would also prescribed Carafate to treat for any esophagitis symptoms.  Also advised patient to try Prilosec twice-daily as well.  Since it is highly unlikely that chemotherapy obtained in June 2015 would be the culprit for any chemotherapy-induced mucositis/esophagitis; and herpetic throat swabbing was negative-will treat for possible esophagitis.  If patient continues with same complaint could consider a candida will esophagitis and prescribed Diflucan or follow-up with ENT referral.

## 2014-09-18 NOTE — Assessment & Plan Note (Signed)
Albumin has decreased to 2.6.  Most likely, this is secondary to patient's very poor appetite due to esophagitis/sore throat symptoms.  Encouraged patient to push protein in her diet is much as possible.

## 2014-09-18 NOTE — Progress Notes (Signed)
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 Condition during procedure: Tolerated well. Condition at discharge: Alert, oriented, left in wheelchair escorted by spouse. No complaints

## 2014-09-18 NOTE — Assessment & Plan Note (Signed)
Patient complaining of atypical esophagitis symptoms; but is also complaining of some URI symptoms with chronic sinus pain.  She states the sinus/facial pain is typically worse at night.  She is taking hydrocodone with only minimal relief.  She completed a round of Levaquin antibiotics chest chest today.  On exam-patient does not appear congested.  Bilateral TMs intact with no effusions.  Patient does have some mild facial tenderness to both her frontal and maxillary sinuses with palpation.  Will prescribe Augmentin for treatment of possible sinusitis symptoms.

## 2014-09-18 NOTE — Assessment & Plan Note (Signed)
Patient received her last chemotherapy on 12/02/2013.  She underwent radioactive iodine therapy on 08/21/2014.  She is currently undergoing Avastin on a every three-week basis.  She last received Avastin on 09/08/2014.  She is scheduled for her next Avastin treatment on 09/29/2014.

## 2014-09-18 NOTE — Telephone Encounter (Signed)
PT. IS VERY WEAK. SHE IS EATING AND TAKING SOME FLUIDS. PT. HAS SEEN DR.MCKEOWN'S PA, COURTNEY FORCUCCI, FOR PAIN BEHIND HER EYES, HEADACHE, AND SINUS PRESSURE. SHE HAS TAKEN LEVAQUIN AND "TWO ROUNDS OF PREDNISONE" BUT IS NOT GETTING ANY BETTER. WOULD LIKE PT. SEEN TODAY. THIS NOTE ROUTED TO [place].

## 2014-09-18 NOTE — Progress Notes (Signed)
SYMPTOM MANAGEMENT CLIN[facility]   HPI: Christina Hale 64 y.o. female diagnosed with lung cancer.  Patient is status post chemotherapy last received in June 2015.  Patient underwent radioactive iodine therapy on 08/21/2014.  Currently undergoing Avastin therapy.  Patient continues to complain of a chronic sore throat for the past 1-2 weeks.  She has seen her primary care provider 2 different times for the same complaint.  She reports completing Levaquin antibiotics just today.  She started her second prednisone taper pack just yesterday as well. She continues with Magic mouthwash with lidocaine and viscous lidocaine with only minimal relief.  Patient obtained a herpetic swab test of her throat while at her primary care office this week as well; and this was negative.  Patient denies any recent fevers or chills.  HPI  ROS  Past Medical History  Diagnosis Date  . Hyperlipidemia   . Hypertension   . Prediabetes   . Vitamin D deficiency   . Anxiety   . Cancer     lung ca dx'd 08/2013    Past Surgical History  Procedure Laterality Date  . Lasik Bilateral 2002  . Carpal tunnel release Right 2000  . Abdominal hysterectomy  1999  . 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    is allergic to codeine and prednisolone.     PHYSICAL EXAMINATION  Oncology Vitals 09/18/2014 09/16/2014 09/11/2014 09/08/2014 09/08/2014 09/08/2014 09/08/2014  Height 178 cm 178 cm 178 cm - - - -  Weight 70.398 kg 71.668 kg 70.761 kg - - - -  Weight (lbs) 155 lbs 3 oz 158 lbs 156 lbs - - - -  BMI (kg/m2) 22.27 kg/m2 22.67 kg/m2 22.38 kg/m2 - - - -  Temp 98.5 98 100.8 - - - -  Pulse 106 102  104 96 88 97 87  Resp _0 - - - -  SpO2 94 98 - - - - -  BSA (m2) 1.86 m2 1.88 m2 1.87 m2 - - - -   BP Readings from Last 3 Encounters:  09/18/14 148/55  09/16/14 148/66  09/11/14 152/74    Physical Exam  Constitutional: She is oriented to person, place, and time. Vital signs are normal. She appears dehydrated. She appears unhealthy.  Patient appears fatigued, weak, frail, and chronically ill.  HENT:  Head: Normocephalic and atraumatic.  Right Ear: External ear normal.  Left Ear: External ear normal.  Nose: Nose normal.  Posterior oropharynx with erythema and exudates/white patches to right side of throat.  Patient managing all oral secretions well.  No nasal congestion on exam.  Patient has some mild bilateral maxillary and frontal sinus tenderness with palpation.  Eyes: Conjunctivae and EOM are normal. Pupils are equal, round, and reactive to light. Right eye exhibits no discharge. Left eye exhibits no discharge. No scleral icterus.  Neck: Normal range of motion. Neck supple. No JVD present. No tracheal deviation present. No thyromegaly present.  Cardiovascular: Normal rate, regular rhythm, normal heart sounds and intact distal pulses.   Pulmonary/Chest: Effort normal and breath sounds normal. No respiratory distress. She has no wheezes. She has no rales. She exhibits no tenderness.  Abdominal: Soft. Bowel sounds are normal. She exhibits no distension and no  mass. There is no tenderness. There is no rebound and no guarding.  Musculoskeletal: Normal range of motion. She exhibits no edema or tenderness.  Lymphadenopathy:    She has no cervical adenopathy.  Neurological: She is alert and oriented to person, place, and time. Gait normal.  Skin: Skin is warm and dry. No rash noted. No erythema. There is pallor.  Psychiatric: Affect normal.  Nursing note and vitals reviewed.   LABORATORY DATA:. Hospital Outpatient Visit on 09/18/2014  Component Date Value Ref Range Status  .  Sodium 09/18/2014 136  135 - 145 mmol/L Final  . Potassium 09/18/2014 3.9  3.5 - 5.1 mmol/L Final  . Chloride 09/18/2014 97  96 - 112 mmol/L Final  . CO2 09/18/2014 29  19 - 32 mmol/L Final  . Glucose, Bld 09/18/2014 119* 70 - 99 mg/dL Final  . BUN 09/18/2014 20  6 - 23 mg/dL Final  . Creatinine, Ser 09/18/2014 0.47* 0.50 - 1.10 mg/dL Final  . Calcium 09/18/2014 9.4  8.4 - 10.5 mg/dL Final  . Total Protein 09/18/2014 7.5  6.0 - 8.3 g/dL Final  . Albumin 09/18/2014 2.6* 3.5 - 5.2 g/dL Final  . AST 09/18/2014 13  0 - 37 U/L Final  . ALT 09/18/2014 17  0 - 35 U/L Final  . Alkaline Phosphatase 09/18/2014 137* 39 - 117 U/L Final  . Total Bilirubin 09/18/2014 0.7  0.3 - 1.2 mg/dL Final  . GFR calc non Af Amer 09/18/2014 >90  >90 mL/min Final  . GFR calc Af Amer 09/18/2014 >90  >90 mL/min Final   Comment: (NOTE) The eGFR has been calculated using the CKD EPI equation. This calculation has not been validated in all clinical situations. eGFR's persistently <90 mL/min signify possible Chronic Kidney Disease.   . Anion gap 09/18/2014 10  5 - 15 Final  Appointment on 09/18/2014  Component Date Value Ref Range Status  . WBC 09/18/2014 12.8* 3.9 - 10.3 10e3/uL Final  . NEUT# 09/18/2014 11.4* 1.5 - 6.5 10e3/uL Final  . HGB 09/18/2014 13.3  11.6 - 15.9 g/dL Final  . HCT 09/18/2014 40.4  34.8 - 46.6 % Final  . Platelets 09/18/2014 107* 145 - 400 10e3/uL Final  . MCV 09/18/2014 85.4  79.5 - 101.0 fL Final  . MCH 09/18/2014 28.1  25.1 - 34.0 pg Final  . MCHC 09/18/2014 32.9  31.5 - 36.0 g/dL Final  . RBC 09/18/2014 4.73  3.70 - 5.45 10e6/uL Final  . RDW 09/18/2014 14.3  11.2 - 14.5 % Final  . lymph# 09/18/2014 0.7* 0.9 - 3.3 10e3/uL Final  . MONO# 09/18/2014 0.6  0.1 - 0.9 10e3/uL Final  . Eosinophils Absolute 09/18/2014 0.1  0.0 - 0.5 10e3/uL Final  . Basophils Absolute 09/18/2014 0.0  0.0 - 0.1 10e3/uL Final  . NEUT% 09/18/2014 89.4* 38.4 - 76.8 % Final  . LYMPH% 09/18/2014 5.3* 14.0 - 49.7 %  Final  . MONO% 09/18/2014 4.5  0.0 - 14.0 % Final  . EOS% 09/18/2014 0.7  0.0 - 7.0 % Final  . BASO% 09/18/2014 0.1  0.0 - 2.0 % Final  . nRBC 09/18/2014 0  0 - 0 % Final  . Source 09/18/2014 THROAT   Preliminary  . Streptococcus, Group A Screen (Dir* 09/18/2014 NEG  NEGATIVE Preliminary   Comment:  A Rapid Antigen test may result negative if the antigen level in thesample is below the detection level of this test. The FDA has notcleared this test as a stand-alone test therefore the  rapid antigennegative result has reflexed to a Group A Strep  culture, unit [id number].     Office Visit on 09/16/2014  Component Date Value Ref Range Status  . HSV 1 Glycoprotein G Ab, IgG 09/16/2014 <0.10   Final   Comment:      IV = Index Value              < 0.90 IV              Negative              0.90-1.10 IV           Equivocal              > 1.10 IV              Positive   . HSV 2 Glycoprotein G Ab, IgG 09/16/2014 <0.10   Final   Comment:      IV = Index Value              < 0.90 IV              Negative              0.90-1.10 IV           Equivocal              > 1.10 IV              Positive      RADIOGRAPHIC STUDIES: No results found.  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  She underwent radioactive iodine therapy on 08/21/2014.  She is currently undergoing Avastin on a every three-week basis.  She last received Avastin on 09/08/2014.  She is scheduled for her next Avastin treatment on 09/29/2014.   Esophagitis Patient continues to complain of a chronic sore throat for the past 1-2 weeks.  She has seen her primary care provider 2 different times for the same complaint.  She reports completing Levaquin antibiotics just just today.  She started her second prednisone taper pack just yesterday as well.  She continues with Magic mouthwash with lidocaine and viscous lidocaine with only minimal relief.   Patient obtained a herpetic swab test of her throat while at her primary care office this week as well; and this was negative.  On exam-patient with erythema to posterior throat; and some exudate/white patches to the right side of her posterior oropharynx.  Patient observed managing all secretions with no difficulty whatsoever.  Was able to obtain a strep test throat swab; which was negative as well.  Advised patient would prescribe Augmentin for questionable sinusitis symptoms.  Would also prescribed Carafate to treat for any esophagitis symptoms.  Also advised patient to try Prilosec twice-daily as well.  Since it is highly unlikely that chemotherapy obtained in June 2015 would be the culprit for any chemotherapy-induced mucositis/esophagitis; and herpetic throat swabbing was negative-will treat for possible esophagitis.  If patient continues with same complaint could consider a candida will esophagitis and prescribed Diflucan or follow-up with ENT referral.   Sinusitis Patient complaining of atypical esophagitis symptoms; but is also complaining of some URI symptoms with chronic sinus pain.  She states the sinus/facial pain is typically worse at night.  She is taking hydrocodone with only minimal relief.  She completed a round of Levaquin antibiotics chest chest today.  On exam-patient does not appear congested.  Bilateral  TMs intact with no effusions.  Patient does have some mild facial tenderness to both her frontal and maxillary sinuses with palpation.  Will prescribe Augmentin for treatment of possible sinusitis symptoms.   Hypoalbuminemia Albumin has decreased to 2.6.  Most likely, this is secondary to patient's very poor appetite due to esophagitis/sore throat symptoms.  Encouraged patient to push protein in her diet is much as possible.   Anorexia Patient with very poor appetite secondary to chronic esophagitis/sore throat.  Patient was encouraged to multiple small meals as often as  possible.   Dehydration Patient will receive 1 L normal saline IV fluid rehydration while the cancer Center today.  She was also encouraged to push fluids is much as possible.  She may very well need additional IV fluid rehydration as well.   Patient stated understanding of all instructions; and was in agreement with this plan of care. The patient knows to call the clinic with any problems, questions or concerns.   Review/collaboration with Dr. Benay Spice regarding all aspects of patient's visit today.   Total time spent with patient was 40 minutes;  with greater than 75 percent of that time spent in face to face counseling regarding patient's symptoms,  and coordination of care and follow up.  Disclaimer: This note was dictated with voice recognition software. Similar sounding words can inadvertently be transcribed and may not be corrected upon review.   Drue Second, NP 09/18/2014

## 2014-09-18 NOTE — Assessment & Plan Note (Signed)
Patient with very poor appetite secondary to chronic esophagitis/sore throat.  Patient was encouraged to multiple small meals as often as possible.

## 2014-09-18 NOTE — Assessment & Plan Note (Signed)
Patient will receive 1 L normal saline IV fluid rehydration while the cancer Center today.  She was also encouraged to push fluids is much as possible.  She may very well need additional IV fluid rehydration as well.

## 2014-09-19 ENCOUNTER — Telehealth: Payer: Self-pay | Admitting: Nurse Practitioner

## 2014-09-19 NOTE — Telephone Encounter (Signed)
Called pt c/o follow up visit, stated her throat is somewhat better and also stated she is still taking antibiotic therapy. Informed pt her Strep-test was negative and if any questions or concern call [facility].pt verbalize understanding.

## 2014-09-20 LAB — RAPID STREP SCREEN (MED CTR MEBANE ONLY): Streptococcus, Group A Screen (Direct): NEGATIVE

## 2014-09-20 LAB — THROAT CULTURE

## 2014-09-22 ENCOUNTER — Other Ambulatory Visit: Payer: Self-pay

## 2014-09-22 ENCOUNTER — Telehealth: Payer: Self-pay | Admitting: *Deleted

## 2014-09-22 ENCOUNTER — Other Ambulatory Visit: Payer: BC Managed Care – PPO

## 2014-09-22 ENCOUNTER — Ambulatory Visit (HOSPITAL_BASED_OUTPATIENT_CLINIC_OR_DEPARTMENT_OTHER): Payer: BC Managed Care – PPO | Admitting: Oncology

## 2014-09-22 ENCOUNTER — Ambulatory Visit (HOSPITAL_COMMUNITY)
Admission: RE | Admit: 2014-09-22 | Discharge: 2014-09-22 | Disposition: A | Discharge status: Home / Self Care | Payer: BC Managed Care – PPO | Source: Ambulatory Visit | Attending: Nurse Practitioner | Admitting: Nurse Practitioner

## 2014-09-22 ENCOUNTER — Other Ambulatory Visit: Payer: Self-pay | Admitting: Nurse Practitioner

## 2014-09-22 VITALS — BP 143/57 | HR 107 | Temp 97.9°F | Resp 18 | Ht 70.0 in | Wt 154.8 lb

## 2014-09-22 DIAGNOSIS — E032 Hypothyroidism due to medicaments and other exogenous substances: Secondary | ICD-10-CM

## 2014-09-22 DIAGNOSIS — Z87891 Personal history of nicotine dependence: Secondary | ICD-10-CM | POA: Diagnosis not present

## 2014-09-22 DIAGNOSIS — R29898 Other symptoms and signs involving the musculoskeletal system: Secondary | ICD-10-CM

## 2014-09-22 DIAGNOSIS — M6281 Muscle weakness (generalized): Secondary | ICD-10-CM | POA: Diagnosis not present

## 2014-09-22 DIAGNOSIS — J3489 Other specified disorders of nose and nasal sinuses: Secondary | ICD-10-CM | POA: Diagnosis not present

## 2014-09-22 DIAGNOSIS — C3412 Malignant neoplasm of upper lobe, left bronchus or lung: Secondary | ICD-10-CM

## 2014-09-22 DIAGNOSIS — R070 Pain in throat: Secondary | ICD-10-CM | POA: Diagnosis not present

## 2014-09-22 DIAGNOSIS — C3410 Malignant neoplasm of upper lobe, unspecified bronchus or lung: Secondary | ICD-10-CM

## 2014-09-22 DIAGNOSIS — E213 Hyperparathyroidism, unspecified: Secondary | ICD-10-CM

## 2014-09-22 DIAGNOSIS — C787 Secondary malignant neoplasm of liver and intrahepatic bile duct: Secondary | ICD-10-CM | POA: Diagnosis not present

## 2014-09-22 DIAGNOSIS — R29810 Facial weakness: Secondary | ICD-10-CM | POA: Diagnosis present

## 2014-09-22 DIAGNOSIS — E039 Hypothyroidism, unspecified: Secondary | ICD-10-CM

## 2014-09-22 MED ORDER — OXYCODONE-ACETAMINOPHEN 5-325 MG/5ML PO SOLN: 5.0000 mL | ORAL | Status: DC | PRN

## 2014-09-22 MED ORDER — IOHEXOL 300 MG/ML  SOLN
80.0000 mL | Freq: Once | INTRAMUSCULAR | Status: AC | PRN
  Administered 2014-09-22: 80 mL via INTRAVENOUS

## 2014-09-22 NOTE — Progress Notes (Addendum)
[place] OFFICE PROGRESS NOTE   Diagnosis:   Lung cancer  INTERVAL HISTORY:    Christina Hale returns prior to scheduled follow-up. She last received Avastin 09/08/2014.  About 1 week ago she developed pressure over the frontal sinuses and difficulty swallowing secondary to throat pain. She has completed 2 courses of steroids and is currently completing a second course of antibiotics with amoxicillin. She had Levaquin initially. She also reports pain over the maxillary sinuses. The pain increases when she bends over. She had a single fever to 101.8 on 09/18/2014. Her face feels "swollen". She is having difficulty sleeping due to the pain. She is tolerating fluids though notes significant pain with swallowing. She has tried Vicodin with minimal relief. Carafate provides short-term partial relief.  Her husband reports that she has been "dragging" the right leg and has had right arm weakness for the past few weeks. He has to help her get dressed.  Objective:  Vital signs in last 24 hours:  Blood pressure 143/57, pulse 107, temperature 97.9 F (36.6 C), temperature source Oral, resp. rate 18, height 5' 10"  (1.778 m), weight 154 lb 12.8 oz (70.217 kg), SpO2 96 %.    HEENT:  No thrush. Question small ulcer at the right posterior palate. Mucous membranes are moist. Posterior pharynx is without erythema or exudate. Lower anterior neck is markedly tender bilaterally. Lymphatics:  No palpable cervical or supraclavicular lymph nodes Resp:  Lungs clear bilaterally. Cardio:  Regular rate and rhythm. GI:  Abdomen soft and nontender. No hepatomegaly. Vascular:  No leg edema. Calves soft and nontender. Neuro:  Alert and oriented. Decreased right nasolabial fold. 4 over 5 strength throughout the right arm and leg.  Port-A-Cath without erythema.    Lab Results:  Lab Results  Component Value Date   WBC 12.8* 09/18/2014   HGB 13.3 09/18/2014   HCT 40.4 09/18/2014   MCV 85.4 09/18/2014    PLT 107* 09/18/2014   NEUTROABS 11.4* 09/18/2014    Imaging:  No results found.  Medications: I have reviewed the patient's current medications.  Assessment/Plan: 1. Stage IV adenocarcinoma of the lung, dominant left upper lobe mass and spiculated right upper lobe mass  Status post a biopsy of a left hepatic mass confirming metastatic adenocarcinoma consistent with a lung primary, ALK mutation negative, EGFR amplification suboptimal-repeat testing recommend   Staging CT scans 09/27/2013 confirmed a dominant left upper lung mass, a smaller right upper lung mass, left hepatic mass, and left external iliac node.   Cycle 1 Taxol/carboplatin and Avastin per the [facility] W5462 study 10/09/2013.   Cycle 2 Taxol/carboplatin and Avastin 10/29/2013.   Restaging CT evaluation 11/14/2013 with improvement in the lungs and liver. Left external iliac lymph node was stable.   Cycle 3 Taxol/carboplatin and Avastin 12/02/2013   Restaging CT 01/02/2014 with a mixed response-slightly larger left upper lung mass and mediastinal lymph node, slightly smaller liver mass and iliac node   Maintenance Avastin beginning 01/06/2014   Restaging CT 03/06/2014 with a decrease in the size of the lung masses and a liver lesion  Restaging CT 05/22/2014 with a stable right upper lung nodule, decreased size of a left upper lobe lung mass, increased size of a liver lesion, stable left pelvic nodule/node.  Restaging CT 07/24/2014 with no significant change in the chest, increase in size of the mass within the left hepatic lobe, 2 retroperitoneal lymph nodes increased in size, stable appearance of peritoneal nodules. 2. C. difficile colitis March 2015 3. Hyponatremia/seizure  on hospital admission 08/29/2013 4. Chronic right hip "bursitis". 5. Nausea and vomiting following cycle 1 Taxol/carboplatin, the anti-emetic regimen was adjusted with cycle 2. 6. Severe neutropenia following cycle 1  Taxol/carboplatin-Neulasta added with cycle 2. 7. 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 9. Anemia. Likely secondary to chemotherapy. She received red cell transfusions 12/20/2013 and 12/21/2013. Improved. 10. Hoarseness-improved, potentially related to toxicity from chemotherapy versus tumor. 11. Right retinal detachment 04/05/2014, status post surgical repair 04/07/2014 12. Low TSH with a borderline elevated T4- she was diagnosed with hyperthyroidism, status post radioactive iodine treatment on 08/21/2014  13. History of Proteinuria-mild   Disposition: Christina Hale presents to the office today for evaluation of pressure over the sinuses and throat pain. On exam she has decreased strength in the right arm and leg and decrease in the right nasolabial fold. We are referring her for a stat brain CT; question CVA , question brain metastases.  Further recommendations pending the result of the brain scan.  Patient seen with Dr. Benay Spice. 30 minutes were spent face-to-face at today's visit with the majority of that time involved in counseling/coordination of care.  Ned Card ANP/GNP-BC   09/22/2014  5:11 PM  This was a shared visit with Ned Card. Christina Hale was interviewed and examined. We are concerned she has developed a CVA or CNS metastases. She will be referred for an urgent brain CT.  Julieanne Manson, M.D.

## 2014-09-22 NOTE — Telephone Encounter (Signed)
Called pt, she reports throat pain, painful to swallow. Also reports pain in ears. Feels like this may be "sinus related" although she has not had sinus issues in the past. Reports face is tender to touch. Reviewed with Dr. Benay Spice: Order received to bring pt in to be seen. Pt stated she can be here within 30 minutes.

## 2014-09-22 NOTE — Telephone Encounter (Signed)
PT. FELT A LITTLE BETTER AFTER THE IV FLUIDS. PT. IS STILL HAVING HEADACHES, JAW PAIN AND DIFFICULTY SWALLOWING. THE NORCO AND CARAFATE HELP VERY LITTLE. PT. IS NOT SLEEPING. THIS NOTE ROUTED TO CYNDEE BACON,NP, AND STACEY CAMP,RN

## 2014-09-23 ENCOUNTER — Ambulatory Visit
Admission: RE | Admit: 2014-09-23 | Discharge: 2014-09-23 | Disposition: A | Discharge status: Home / Self Care | Payer: BC Managed Care – PPO | Source: Ambulatory Visit | Attending: Nurse Practitioner | Admitting: Nurse Practitioner

## 2014-09-23 ENCOUNTER — Other Ambulatory Visit: Payer: Self-pay | Admitting: Nurse Practitioner

## 2014-09-23 ENCOUNTER — Telehealth: Payer: Self-pay | Admitting: *Deleted

## 2014-09-23 ENCOUNTER — Telehealth: Payer: Self-pay | Admitting: Nurse Practitioner

## 2014-09-23 ENCOUNTER — Telehealth: Payer: Self-pay | Admitting: Oncology

## 2014-09-23 DIAGNOSIS — C3410 Malignant neoplasm of upper lobe, unspecified bronchus or lung: Secondary | ICD-10-CM

## 2014-09-23 LAB — TSH

## 2014-09-23 MED ORDER — OXYCODONE HCL 5 MG/5ML PO SOLN: 5.0000 mg | ORAL | Status: DC | PRN

## 2014-09-23 MED ORDER — DEXAMETHASONE 4 MG PO TABS
4.0000 mg | ORAL_TABLET | Freq: Two times a day (BID) | ORAL | Status: DC
Start: 2014-09-23 — End: 2014-10-06

## 2014-09-23 MED ORDER — GADOBENATE DIMEGLUMINE 529 MG/ML IV SOLN: 14.0000 mL | Freq: Once | INTRAVENOUS | Status: AC | PRN

## 2014-09-23 NOTE — Telephone Encounter (Signed)
Spouse called reporting "[facility] called him for CT brain and we had that my wife had last night.  We also have a prescription for liquid hydrocodone that [facility] not have.  She was in pain all night."  Informed him today's test is for MRI of head and spine which shows clearer pictures than CT.  Advised to take prescription to [facility] Mon-Fri 7:30 am till 6:00 pm or [facility] in [place]

## 2014-09-23 NOTE — Telephone Encounter (Signed)
Called patient and she is aware of her test at [facility] imaging today

## 2014-09-23 NOTE — Telephone Encounter (Signed)
Spoke with karen in rad/onc and she will have Susan/brain nav.  look at this referral and either she will call the patient or it will come back to [facility] and she will call me

## 2014-09-24 ENCOUNTER — Ambulatory Visit
Admission: RE | Admit: 2014-09-24 | Discharge: 2014-09-24 | Disposition: A | Discharge status: Home / Self Care | Payer: BC Managed Care – PPO | Source: Ambulatory Visit | Attending: Radiation Oncology | Admitting: Radiation Oncology

## 2014-09-24 ENCOUNTER — Encounter: Payer: Self-pay | Admitting: Radiation Oncology

## 2014-09-24 ENCOUNTER — Telehealth: Payer: Self-pay | Admitting: *Deleted

## 2014-09-24 VITALS — BP 126/49 | HR 114 | Temp 98.4°F | Wt 154.0 lb

## 2014-09-24 DIAGNOSIS — C3410 Malignant neoplasm of upper lobe, unspecified bronchus or lung: Secondary | ICD-10-CM

## 2014-09-24 DIAGNOSIS — C787 Secondary malignant neoplasm of liver and intrahepatic bile duct: Secondary | ICD-10-CM | POA: Diagnosis not present

## 2014-09-24 DIAGNOSIS — E559 Vitamin D deficiency, unspecified: Secondary | ICD-10-CM | POA: Insufficient documentation

## 2014-09-24 DIAGNOSIS — C3411 Malignant neoplasm of upper lobe, right bronchus or lung: Secondary | ICD-10-CM | POA: Diagnosis not present

## 2014-09-24 DIAGNOSIS — Z87891 Personal history of nicotine dependence: Secondary | ICD-10-CM | POA: Insufficient documentation

## 2014-09-24 DIAGNOSIS — Z9221 Personal history of antineoplastic chemotherapy: Secondary | ICD-10-CM | POA: Diagnosis not present

## 2014-09-24 DIAGNOSIS — C7951 Secondary malignant neoplasm of bone: Secondary | ICD-10-CM | POA: Diagnosis not present

## 2014-09-24 DIAGNOSIS — Z510 Encounter for antineoplastic radiation therapy: Secondary | ICD-10-CM | POA: Diagnosis present

## 2014-09-24 DIAGNOSIS — I10 Essential (primary) hypertension: Secondary | ICD-10-CM | POA: Insufficient documentation

## 2014-09-24 DIAGNOSIS — C3412 Malignant neoplasm of upper lobe, left bronchus or lung: Secondary | ICD-10-CM | POA: Insufficient documentation

## 2014-09-24 DIAGNOSIS — F419 Anxiety disorder, unspecified: Secondary | ICD-10-CM | POA: Insufficient documentation

## 2014-09-24 DIAGNOSIS — C7931 Secondary malignant neoplasm of brain: Secondary | ICD-10-CM | POA: Insufficient documentation

## 2014-09-24 DIAGNOSIS — C778 Secondary and unspecified malignant neoplasm of lymph nodes of multiple regions: Secondary | ICD-10-CM | POA: Insufficient documentation

## 2014-09-24 NOTE — Progress Notes (Signed)
Please see the Nurse Progress Note in the MD Initial Consult Encounter for this patient. 

## 2014-09-24 NOTE — Progress Notes (Signed)
Patient here with spouse and daughter.diagnosed with metastatic adenocarcinoma lung primary  on 09/13/13.Has undergone chemotherapy of taxol and carboplatin.Developed headache and symptoms of sinusitis which she had steroid treatment of prednisone twice.Patient later develop lower extremity weakness and dragging of right leg.she had retna detachment 5 months ago and had thyroid treatment through [facility] 6 weeks ago.Dexamethasone 4 mg bid ordered on 09/23/14 not picked up for pharmacy.Informed husband to pick up medication and start this evening.

## 2014-09-24 NOTE — Telephone Encounter (Signed)
TCT from pt's husband, Eduard Clos. He states that his wife had brain/spine MRI on 09/23/14 and has gotten report back on those results. They will be Radiation Oncol[facility] today @ 4pm.. He wants to know if his wife still needs CT scans scheduled for tomorrow.  Please advise. Call husband @ [phone]

## 2014-09-24 NOTE — Progress Notes (Signed)
Location/Histology of Brain Tumor: stage IV adenocarcinoma of the lung; now with five brain metastases ranging from 3 mm to a large 3.1 cm brainstem metastasis located in the left pons and two left posterior skull bone metastases, the larger near the left mastoid air cells.  Patient presented with symptoms of:  Dragging right leg, right arm weakness, painful pressure over frontal and maxillary sinus and difficulty swallowing on 09/22/2014  Past or anticipated interventions, if any, per neurosurgery: no  Past or anticipated interventions, if any, per medical oncology: yes. Completed several cycles of taxol/carboplatin. Last received Avastin on 09/08/14.   Dose of Decadron, if applicable: decadron 4 mg bid  Recent neurologic symptoms, if any:   Seizures: no  Headaches: yes  Nausea: no  Dizziness/ataxia: no  Difficulty with hand coordination: no  Focal numbness/weakness: yes  Visual deficits/changes: no  Confusion/Memory deficits: no  Painful bone metastases at present, if any: no  SAFETY ISSUES:  Prior radiation? no  Pacemaker/ICD? no  Possible current pregnancy? no  Is the patient on methotrexate? no  Additional Complaints / other details: 64 year old female.

## 2014-09-24 NOTE — Progress Notes (Signed)
[name]         [phone] ________________________________  Initial outpatient Consultation  Name: Christina Hale MRN: [id number]  Date: 09/24/2014  DOB: 1950-10-28  REFERRING PHYSICIAN: Ladell Pier, MD  DIAGNOSIS: 64 yo woman with multiple brain metastases from metastatic cancer of the left upper lung    ICD-9-CM ICD-10-CM   1. Brain metastases 198.3 C79.31   2. Bone metastases 198.5 C79.51   3. Malignant neoplasm of upper lobe of lung, unspecified laterality 162.3 C34.10     HISTORY OF PRESENT ILLNESS::Christina Hale is a 64 y.o. female who is was diagnosed with stage IV adenocarcinoma of the lung, dominant left upper lobe mass and spiculated right upper lobe mass in March 2015.      At diagnosis, a biopsy of a left hepatic mass confirming metastatic adenocarcinoma consistent with a lung primary, ALK mutation negative, EGFR amplification suboptimal.  Staging CT scans 09/27/2013 confirmed a dominant left upper lung mass, a smaller right upper lung mass, left hepatic mass, and a left external iliac node. She went on to receive Taxol/carboplatin and Avastin 10/09/2013, and 10/29/2013.  Restaging CT evaluation 11/14/2013 showed improvement in the lungs and liver. Left external iliac lymph node was stable. He continued Taxol/carboplatin and Avastin 12/02/2013.  Restaging CT 01/02/2014 with a mixed response-slightly larger left upper lung mass and mediastinal lymph node, slightly smaller liver mass and iliac node.  She went on to start maintenance Avastin beginning 01/06/2014.  Subsequent restaging CT 03/06/2014 showed a decrease in the size of the lung masses and a liver lesion.  Another restaging CT 05/22/2014 showed stable right upper lung nodule, decreased size of a left upper lobe lung mass, increased size of a liver lesion, stable left pelvic nodule/node.  Restaging CT 07/24/2014 showed no significant change in the chest, increase in size of the mass within the  left hepatic lobe, and two retroperitoneal lymph nodes increased in size, with stable peritoneal nodules.  During 4/18 follow-up in medical oncology, her husband reports that she has been "dragging" the right leg and has had right arm weakness for the past few weeks. He has to help her get dressed.  MRI brain on 4/19 showed 5 brain metastases with the largest over 3 cm in the left brainstem.      PREVIOUS RADIATION THERAPY: No  Past Medical History  Diagnosis Date  . Hyperlipidemia   . Hypertension   . Prediabetes   . Vitamin D deficiency   . Anxiety   . Lung cancer   . Brain cancer     brain mets metastatic lung ca  :   Past Surgical History  Procedure Laterality Date  . Lasik Bilateral 2002  . Carpal tunnel release Right 2000  . Abdominal hysterectomy  1999  . Cesarean section      x2  :   Current outpatient prescriptions:  .  ALPRAZolam (XANAX) 0.25 MG tablet, Take 1-2 tablets every 6 hours as needed for anxiety, Disp: 60 tablet, Rfl: 0 .  Alum & Mag Hydroxide-Simeth (MAGIC MOUTHWASH W/LIDOCAINE) SOLN, Take 5 mLs by mouth 4 (four) times daily as needed for mouth pain (swish for 1 minute, then spit)., Disp: 240 mL, Rfl: 0 .  amLODipine (NORVASC) 5 MG tablet, TAKE 1 TABLET BY MOUTH DAILY ** NEEDED OFFICE VISIT, Disp: 30 tablet, Rfl: 5 .  amoxicillin-clavulanate (AUGMENTIN) 875-125 MG per tablet, Take 1 tablet by mouth 2 (two) times daily., Disp: 20 tablet, Rfl: 0 .  Cholecalciferol (VITAMIN D)  2000 UNITS CAPS, Take 4,000 Units by mouth daily., Disp: , Rfl:  .  dexamethasone (DECADRON) 4 MG tablet, Take 1 tablet (4 mg total) by mouth 2 (two) times daily., Disp: 50 tablet, Rfl: 0 .  erythromycin ophthalmic ointment, , Disp: , Rfl: 1 .  HYDROcodone-acetaminophen (NORCO) 5-325 MG per tablet, Take 1 tablet by mouth every 6 (six) hours as needed for moderate pain or severe pain., Disp: 20 tablet, Rfl: 0 .  levofloxacin (LEVAQUIN) 750 MG tablet, Take 1 tablet (750 mg total) by  mouth daily. X 7 days, Disp: 7 tablet, Rfl: 0 .  lidocaine (XYLOCAINE) 2 % solution, Use as directed 20 mLs in the mouth or throat as needed for mouth pain., Disp: 100 mL, Rfl: 0 .  Multiple Vitamin (MULTIVITAMIN WITH MINERALS) TABS tablet, Take 1 tablet by mouth daily., Disp: , Rfl:  .  ondansetron (ZOFRAN) 4 MG tablet, Take 1 tablet (4 mg total) by mouth daily as needed for nausea or vomiting. (Patient not taking: Reported on 09/16/2014), Disp: 30 tablet, Rfl: 1 .  oxyCODONE (ROXICODONE) 5 MG/5ML solution, Take 5 mLs (5 mg total) by mouth every 4 (four) hours as needed for severe pain., Disp: 240 mL, Rfl: 0 .  predniSONE (DELTASONE) 20 MG tablet, 3 tabs po day one, then 2 tabs daily x 4 days, Disp: 11 tablet, Rfl: 0 .  senna-docusate (SENNA S) 8.6-50 MG per tablet, Take 1 tablet by mouth daily., Disp: , Rfl:  .  sucralfate (CARAFATE) 1 GM/10ML suspension, Take 10 mLs (1 g total) by mouth 4 (four) times daily -  with meals and at bedtime., Disp: 420 mL, Rfl: 1 .  traMADol (ULTRAM) 50 MG tablet, Take 1 tablet (50 mg total) by mouth every 6 (six) hours as needed., Disp: 40 tablet, Rfl: 0:  Allergies  Allergen Reactions  . Codeine Nausea Only  . Prednisolone Nausea Only  :   Family History  Problem Relation Age of Onset  . Heart disease Mother   . Hypertension Mother   . Heart attack Mother   . Pneumonia Father   :   History   Social History  . Marital Status: Married    Spouse Name: N/A  . Number of Children: N/A  . Years of Education: N/A   Occupational History  . Not on file.   Social History Main Topics  . Smoking status: Former Smoker -- 1.00 packs/day for 40 years    Types: Cigarettes    Quit date: 08/30/2013  . Smokeless tobacco: Never Used  . Alcohol Use: No  . Drug Use: No  . Sexual Activity: Not Currently   Other Topics Concern  . Not on file   Social History Narrative  :  REVIEW OF SYSTEMS:  A 15 point review of systems is documented in the electronic  medical record. This was obtained by the nursing staff. However, I reviewed this with the patient to discuss relevant findings and make appropriate changes.  Pertinent items are noted in HPI.   PHYSICAL EXAM:  Blood pressure 126/49, pulse 114, temperature 98.4 F (36.9 C), weight 154 lb (69.854 kg), SpO2 96 %. Per med-onc HEENT: No thrush. Question small ulcer at the right posterior palate. Mucous membranes are moist. Posterior pharynx is without erythema or exudate. Lower anterior neck is markedly tender bilaterally. Lymphatics: No palpable cervical or supraclavicular lymph nodes Resp: Lungs clear bilaterally. Cardio: Regular rate and rhythm. GI: Abdomen soft and nontender. No hepatomegaly. Vascular: No leg edema. Calves soft  and nontender. Neuro: Alert and oriented. Decreased right nasolabial fold. 4 over 5 strength throughout the right arm and leg.  Port-A-Cath without erythema.   KPS = 70  100 - Normal; no complaints; no evidence of disease. 90   - Able to carry on normal activity; minor signs or symptoms of disease. 80   - Normal activity with effort; some signs or symptoms of disease. 95   - Cares for self; unable to carry on normal activity or to do active work. 60   - Requires occasional assistance, but is able to care for most of his personal needs. 50   - Requires considerable assistance and frequent medical care. 45   - Disabled; requires special care and assistance. 44   - Severely disabled; hospital admission is indicated although death not imminent. 45   - Very sick; hospital admission necessary; active supportive treatment necessary. 10   - Moribund; fatal processes progressing rapidly. 0     - Dead  Karnofsky DA, Abelmann [place], Craver LS and Victoria Vera JH [phone]) The use of the nitrogen mustards in the palliative treatment of carcinoma: with particular reference to bronchogenic carcinoma Cancer 1 634-56  LABORATORY DATA:  Lab Results  Component Value Date   WBC  12.8* 09/18/2014   HGB 13.3 09/18/2014   HCT 40.4 09/18/2014   MCV 85.4 09/18/2014   PLT 107* 09/18/2014   Lab Results  Component Value Date   NA 136 09/18/2014   K 3.9 09/18/2014   CL 97 09/18/2014   CO2 29 09/18/2014   Lab Results  Component Value Date   ALT 17 09/18/2014   AST 13 09/18/2014   ALKPHOS 137* 09/18/2014   BILITOT 0.7 09/18/2014     RADIOGRAPHY: Ct Head W Wo Contrast  09/22/2014   CLINICAL DATA:  Right facial droop and right leg weakness. Lung cancer  EXAM: CT HEAD WITHOUT AND WITH CONTRAST  TECHNIQUE: Contiguous axial images were obtained from the base of the skull through the vertex without and with intravenous contrast  CONTRAST:  2m OMNIPAQUE IOHEXOL 300 MG/ML  SOLN  COMPARISON:  MRI head 09/01/2013 .  FINDINGS: FINDINGS Ventricle size is normal.  Cerebral volume is normal for age.  Negative for acute or chronic infarction. Negative for hemorrhage or mass. No edema or mass-effect  Normal enhancement following contrast infusion. No enhancing mass lesion.  Calvarium is intact.  IMPRESSION: Negative  These results will be called to the ordering clinician or representative by the Radiologist Assistant, and communication documented in the PACS or zVision Dashboard.   Electronically Signed   By: CFranchot GalloM.D.   On: 09/22/2014 18:56   Mr BJeri CosWBSContrast  09/23/2014   ADDENDUM REPORT: 09/23/2014 15:28  ADDENDUM: Study discussed by telephone with NP LISA THOMAS on 09/23/2014 at 1521 hours.  And in addition there is a small right C1 vertebral metastasis suspected on series 4, image 11.   Electronically Signed   By: HGenevie AnnM.D.   On: 09/23/2014 15:28   09/23/2014   CLINICAL DATA:  64year old female with lung cancer, right side weakness and facial droop. Nuclear medicine I 131 hyperthyroid ablation in March. Throat pain. Subsequent encounter.  EXAM: MRI HEAD WITHOUT AND WITH CONTRAST  TECHNIQUE: Multiplanar, multiecho pulse sequences of the brain and surrounding  structures were obtained without and with intravenous contrast.  CONTRAST:  14 mL multihance  COMPARISON:  Cervical spine MRI from today reported separately.  Brain MRI 09/01/2013.  Recent head CT  09/22/2014.  FINDINGS: There is a round 17 x 24 x 31 mm mass in the brainstem, centered in the left paracentral pons. This has heterogeneous T2 hyperintensity, FLAIR hyperintensity, largely facilitated diffusion, and solid minimally heterogeneous postcontrast enhancement. There is a paucity of surrounding edema. There is mild mass effect and expansion of the pons.  There is also a new 9 mm diameter enhancing mass in the medial posterior right temporal lobe sub up enema near the choroid plexus (series 11, image 35). Likewise, minimal edema and mass effect at that site.  There are also two 3-4 mm metastases identified along the posterior hemispheres, 1 near the left peri-Rolandic cortex on series 11, image 53, an the second in the right occipital pole on image 25 (that lesion is best seen on coronal series 12, image 6). There is also a subtle 7 mm lesion along the left occipital pole with associated increased diffusion signal on series 11, image 34, best seen on series 13, image 23.  No other enhancing brain mass identified. Negative visualized cervical spinal cord.  Small 8 mm bone metastasis suspected in the left occipital bone on series 11, image 38. There is a larger posterior left temporal bone metastasis encompassing up to 2 cm on image 21 which abuts the left sigmoid sinus and mastoid air cells. The venous sinuses remain patent.  No other bone metastasis identified.  No restricted diffusion or evidence of acute infarction. No acute intracranial hemorrhage identified. No ventriculomegaly. Major intracranial vascular flow voids are preserved. Visualized scalp soft tissues are within normal limits. Visualized paranasal sinuses and mastoids are clear.  Presumed postoperative change to the right lateral globe on series 7,  image 9.  IMPRESSION: 1. Five brain metastases ranging from 3 mm to a large 3.1 cm brainstem metastasis located in the left pons. Virtually no associated cerebral edema. Minimal mass effect. 2. Two left posterior skull bone metastases, the larger near the left mastoid air cells.  Electronically Signed: By: Genevie Ann M.D. On: 09/23/2014 15:13   Mr Cervical Spine Wo Contrast  09/23/2014   CLINICAL DATA:  Neck pain for 2 weeks.  Metastatic lung cancer.  EXAM: MRI CERVICAL SPINE WITHOUT CONTRAST  TECHNIQUE: Multiplanar, multisequence MR imaging of the cervical spine was performed. No intravenous contrast was administered.  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 The gland is enlarged and there is slight edema in the soft tissues around the necrotic gland. The patient was treated with I 131 therapy for hyperthyroidism on 08/21/2014.  The visualized intracranial contents are normal. Cervical spinal cord is normal with no mass lesion or myelopathy.  C1-2 and C2-3: Moderate right and slight left facet arthritis at C2-3. Normal C2-3 disc.  C3-4: Tiny central disc bulge with no neural impingement. Moderate right facet arthritis. Slight narrowing of the right neural foramen.  C4-5: Moderate right and slight left facet arthritis. Minimal uncinate spurs. No foraminal stenosis.  C5-6: Disc osteophyte complex protrudes into both lateral recesses and both neural foramina, right more than left. This creates bilateral foraminal stenosis. Moderate right and slight left facet arthritis.  C6-7: Small central and to the left soft disc protrusion extending into the left lateral recess and proximal left neural foramen. No foraminal stenosis. Minimal right facet arthritis.  C7-T1:  Slight bilateral facet arthritis.  Normal disc.  T1-2:  Moderate left facet arthritis.  IMPRESSION: 1. 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 This is probably responsible for the patient's neck pain. 2. Bilateral foraminal and lateral recess stenosis at C5-6 which could affect either or both C6 nerves. 3. Small disc protrusion central and to the left at C6-7 which could affect the left C7 nerve.   Electronically Signed   By: Lorriane Shire M.D.   On: 09/23/2014 14:28      IMPRESSION: 64 year old woman with multiple brain metastases from adenocarcinoma of the left upper lobe of the lung.  At this point, the patient would potentially benefit from radiotherapy. The options include whole brain irradiation versus stereotactic radiosurgery. There are pros and cons associated with each of these potential treatment options. Whole brain radiotherapy would treat the known metastatic deposits and help provide some reduction of risk for future brain metastases. However, whole brain radiotherapy carries potential risks including hair loss, subacute somnolence, and neurocognitive changes including a possible reduction in short-term memory. Whole brain radiotherapy also may carry a lower likelihood of tumor control at the treatment sites because of the low-dose used. Stereotactic radiosurgery carries a higher likelihood for local tumor control at the targeted sites with lower associated risk for neurocognitive changes such as memory loss. However, the use of stereotactic radiosurgery in this setting may leave the patient at increased risk for new brain metastases elsewhere in the brain as high as 50-60%. Accordingly, patients who receive stereotactic radiosurgery in this setting should undergo ongoing surveillance imaging with brain MRI more frequently in order to identify and treat new small brain metastases before they become symptomatic. Stereotactic radiosurgery does carry some different risks, including a risk of radionecrosis.  PLAN: Today, I reviewed the findings and workup thus far with the patient. We discussed the  dilemma regarding whole brain radiotherapy versus stereotactic radiosurgery. We discussed the pros and cons of each. We also discussed the logistics and delivery of each. We reviewed the results associated with each of the treatments described above. The patient seems to understand the treatment options and would like to proceed with stereotactic radiosurgery.  In order to proceed, she will initially need a 3T SRS MRI.  I spent 60 minutes minutes face to face with the patient and more than 50% of that time was spent in counseling and/or coordination of care.    ------------------------------------------------  Sheral Apley. Tammi Klippel, M.D.

## 2014-09-25 ENCOUNTER — Ambulatory Visit (HOSPITAL_COMMUNITY)
Admission: RE | Admit: 2014-09-25 | Discharge: 2014-09-25 | Disposition: A | Discharge status: Home / Self Care | Payer: BC Managed Care – PPO | Source: Ambulatory Visit | Attending: Oncology | Admitting: Oncology

## 2014-09-25 ENCOUNTER — Other Ambulatory Visit: Payer: Self-pay | Admitting: Radiation Therapy

## 2014-09-25 ENCOUNTER — Ambulatory Visit (HOSPITAL_COMMUNITY): Payer: BC Managed Care – PPO

## 2014-09-25 ENCOUNTER — Other Ambulatory Visit: Payer: Self-pay | Admitting: *Deleted

## 2014-09-25 DIAGNOSIS — C7931 Secondary malignant neoplasm of brain: Secondary | ICD-10-CM

## 2014-09-25 NOTE — Telephone Encounter (Signed)
Per Dr. Benay Spice: OK to cancel CT scans. Will reschedule to a later date. Notified Christina Hale, they will follow up as scheduled on 09/29/14.

## 2014-09-26 ENCOUNTER — Telehealth: Payer: Self-pay | Admitting: Radiation Oncology

## 2014-09-26 NOTE — Telephone Encounter (Signed)
Received voicemail message from patient's husband, Eduard Clos, questioning date and time of future appointments. Phoned Charlie back. No answer. Left message with appointment date and times. Encouraged call back with future needs.

## 2014-09-29 ENCOUNTER — Other Ambulatory Visit: Payer: BC Managed Care – PPO

## 2014-09-29 ENCOUNTER — Ambulatory Visit (HOSPITAL_BASED_OUTPATIENT_CLINIC_OR_DEPARTMENT_OTHER): Payer: BC Managed Care – PPO | Admitting: Oncology

## 2014-09-29 ENCOUNTER — Ambulatory Visit: Payer: BC Managed Care – PPO

## 2014-09-29 ENCOUNTER — Other Ambulatory Visit (HOSPITAL_BASED_OUTPATIENT_CLINIC_OR_DEPARTMENT_OTHER): Payer: BC Managed Care – PPO

## 2014-09-29 ENCOUNTER — Ambulatory Visit: Payer: BC Managed Care – PPO | Admitting: Oncology

## 2014-09-29 ENCOUNTER — Telehealth: Payer: Self-pay | Admitting: Oncology

## 2014-09-29 VITALS — BP 153/71 | HR 84 | Temp 97.5°F | Resp 18 | Ht 70.0 in | Wt 150.8 lb

## 2014-09-29 DIAGNOSIS — C3412 Malignant neoplasm of upper lobe, left bronchus or lung: Secondary | ICD-10-CM

## 2014-09-29 DIAGNOSIS — C787 Secondary malignant neoplasm of liver and intrahepatic bile duct: Secondary | ICD-10-CM | POA: Diagnosis not present

## 2014-09-29 DIAGNOSIS — C7951 Secondary malignant neoplasm of bone: Secondary | ICD-10-CM | POA: Diagnosis not present

## 2014-09-29 DIAGNOSIS — C7931 Secondary malignant neoplasm of brain: Secondary | ICD-10-CM | POA: Diagnosis not present

## 2014-09-29 DIAGNOSIS — C3410 Malignant neoplasm of upper lobe, unspecified bronchus or lung: Secondary | ICD-10-CM

## 2014-09-29 LAB — COMPREHENSIVE METABOLIC PANEL (CC13)
ALT: 18 U/L (ref 0–55)
ANION GAP: 13 meq/L — AB (ref 3–11)
AST: 13 U/L (ref 5–34)
Albumin: 2.8 g/dL — ABNORMAL LOW (ref 3.5–5.0)
Alkaline Phosphatase: 102 U/L (ref 40–150)
BILIRUBIN TOTAL: 0.29 mg/dL (ref 0.20–1.20)
BUN: 18.8 mg/dL (ref 7.0–26.0)
CO2: 25 mEq/L (ref 22–29)
Calcium: 9.5 mg/dL (ref 8.4–10.4)
Chloride: 98 mEq/L (ref 98–109)
Creatinine: 0.7 mg/dL (ref 0.6–1.1)
EGFR: >90 mL/min/{1.73_m2} (ref >90)
Glucose: 97 mg/dl (ref 70–140)
Potassium: 4.2 mEq/L (ref 3.5–5.1)
Sodium: 135 mEq/L — ABNORMAL LOW (ref 136–145)
TOTAL PROTEIN: 7.2 g/dL (ref 6.4–8.3)

## 2014-09-29 LAB — CBC WITH DIFFERENTIAL/PLATELET
BASO%: 0 % (ref 0.0–2.0)
Basophils Absolute: 0 10*3/uL (ref 0.0–0.1)
EOS%: 0 % (ref 0.0–7.0)
Eosinophils Absolute: 0 10*3/uL (ref 0.0–0.5)
HEMATOCRIT: 42.8 % (ref 34.8–46.6)
HEMOGLOBIN: 14 g/dL (ref 11.6–15.9)
LYMPH%: 4.8 % — AB (ref 14.0–49.7)
MCH: 27.8 pg (ref 25.1–34.0)
MCHC: 32.7 g/dL (ref 31.5–36.0)
MCV: 85.1 fL (ref 79.5–101.0)
MONO#: 0.3 10*3/uL (ref 0.1–0.9)
MONO%: 3.4 % (ref 0.0–14.0)
NEUT#: 8.2 10*3/uL — ABNORMAL HIGH (ref 1.5–6.5)
NEUT%: 91.8 % — ABNORMAL HIGH (ref 38.4–76.8)
Platelets: 162 10*3/uL (ref 145–400)
RBC: 5.03 10*6/uL (ref 3.70–5.45)
RDW: 13.6 % (ref 11.2–14.5)
WBC: 8.9 10*3/uL (ref 3.9–10.3)
lymph#: 0.4 10*3/uL — ABNORMAL LOW (ref 0.9–3.3)

## 2014-09-29 NOTE — Progress Notes (Signed)
[facility] OFFICE PROGRESS NOTE   Diagnosis: Non-small cell lung cancer  INTERVAL HISTORY:   Christina Hale returns for scheduled visit. She was diagnosed with brain metastases last week. She saw Dr. Tammi Klippel and is being scheduled for [facility] treatment. I placed her on Decadron 09/23/2014. She has noted improvement in the neck/swallowing pain and right-sided weakness. She remains anxious.  Objective:  Vital signs in last 24 hours:  Blood pressure 153/71, pulse 84, temperature 97.5 F (36.4 C), temperature source Oral, resp. rate 18, height 5' 10"  (1.778 m), weight 150 lb 12.8 oz (68.402 kg), SpO2 100 %.    HEENT: No thrush Lymphatics: No cervical or supraclavicular nodes Resp: Lungs clear bilaterally Cardio: Regular rate and rhythm GI: No hepatosplenomegaly Vascular: No leg edema Neuro: 4/5 strength at the right arm and leg, minimal decrease in the right nasolabial fold    Portacath/PICC-without erythema  Lab Results:  Lab Results  Component Value Date   WBC 8.9 09/29/2014   HGB 14.0 09/29/2014   HCT 42.8 09/29/2014   MCV 85.1 09/29/2014   PLT 162 09/29/2014   NEUTROABS 8.2* 09/29/2014     Medications: I have reviewed the patient's current medications.  Assessment/Plan: 1. Stage IV adenocarcinoma of the lung, dominant left upper lobe mass and spiculated right upper lobe mass  Status post a biopsy of a left hepatic mass confirming metastatic adenocarcinoma consistent with a lung primary, ALK mutation negative, EGFR amplification suboptimal-repeat testing recommend   Staging CT scans 09/27/2013 confirmed a dominant left upper lung mass, a smaller right upper lung mass, left hepatic mass, and left external iliac node.   Cycle 1 Taxol/carboplatin and Avastin per the [facility]U U7253 study 10/09/2013.   Cycle 2 Taxol/carboplatin and Avastin 10/29/2013.   Restaging CT evaluation 11/14/2013 with improvement in the lungs and liver. Left external iliac lymph node  was stable.   Cycle 3 Taxol/carboplatin and Avastin 12/02/2013   Restaging CT 01/02/2014 with a mixed response-slightly larger left upper lung mass and mediastinal lymph node, slightly smaller liver mass and iliac node   Maintenance Avastin beginning 01/06/2014   Restaging CT 03/06/2014 with a decrease in the size of the lung masses and a liver lesion  Restaging CT 05/22/2014 with a stable right upper lung nodule, decreased size of a left upper lobe lung mass, increased size of a liver lesion, stable left pelvic nodule/node.  Restaging CT 07/24/2014 with no significant change in the chest, increase in size of the mass within the left hepatic lobe, 2 retroperitoneal lymph nodes increased in size, stable appearance of peritoneal nodules. 2. C. difficile colitis March 2015 3. Hyponatremia/seizure on hospital admission 08/29/2013 4. Chronic right hip "bursitis". 5. Nausea and vomiting following cycle 1 Taxol/carboplatin, the anti-emetic regimen was adjusted with cycle 2. 6. Severe neutropenia following cycle 1 Taxol/carboplatin-Neulasta added with cycle 2. 7. 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 9. Anemia. Likely secondary to chemotherapy. She received red cell transfusions 12/20/2013 and 12/21/2013. Improved. 10. Hoarseness-improved, potentially related to toxicity from chemotherapy versus tumor. 11. Right retinal detachment 04/05/2014, status post surgical repair 04/07/2014 12. Low TSH with a borderline elevated T4- she was diagnosed with hyperthyroidism, status post radioactive iodine treatment on 08/21/2014  13. History of Proteinuria-mild 14. Decreased right nasolabial fold and right arm/leg weakness 09/22/2014  MRI brain/cervical spine 09/23/2014 revealed 5 brain metastases including a 3.1 cm brainstem metastasis in the left pons, 2 left posterior skull metastases, and a C1 metastasis  Decadron  started 09/23/2014 15. Neck  pain, pain with swallowing-likely related to thyroid inflammation from I-131 therapy, improved with Decadron     Disposition:  I discussed the MRI findings with Christina Hale by telephone on 09/23/2014 and again today. There is clinical and x-ray evidence of disease progression. She will be taken off of the E5508 study. She will proceed with SRS to the brain lesions per Dr. Tammi Klippel. The Decadron can be tapered by Dr. Tammi Klippel.  She will see Dr. Melford Aase later this week to discuss thyroid hormone replacement.  We will schedule restaging CTs of the chest and abdomen prior to a follow-up office visit 10/13/2014. We will discuss salvage Alimta chemotherapy when we see her on 10/13/2014.  Christina Coder, MD  09/29/2014  10:06 AM

## 2014-09-29 NOTE — Telephone Encounter (Signed)
Pt confirmed labs/ov per 04/25 POF, gave pt AVS and calendar..... KJ, sent msg to add chemo

## 2014-09-30 ENCOUNTER — Telehealth: Payer: Self-pay | Admitting: Radiation Oncology

## 2014-09-30 NOTE — Telephone Encounter (Signed)
Received voicemail message from patient's husband, Eduard Clos. Returned call at 1025. No answer. Left message. Awaiting return call. Unable to understand from message what the significant other needed.

## 2014-10-01 ENCOUNTER — Ambulatory Visit
Admission: RE | Admit: 2014-10-01 | Discharge: 2014-10-01 | Disposition: A | Discharge status: Home / Self Care | Payer: BC Managed Care – PPO | Source: Ambulatory Visit | Attending: Radiation Oncology | Admitting: Radiation Oncology

## 2014-10-01 VITALS — BP 149/74 | HR 85 | Temp 98.0°F | Resp 16 | Wt 149.9 lb

## 2014-10-01 DIAGNOSIS — Z510 Encounter for antineoplastic radiation therapy: Secondary | ICD-10-CM | POA: Diagnosis not present

## 2014-10-01 DIAGNOSIS — C7931 Secondary malignant neoplasm of brain: Secondary | ICD-10-CM

## 2014-10-01 MED ORDER — SODIUM CHLORIDE 0.9 % IJ SOLN
10.0000 mL | Freq: Once | INTRAMUSCULAR | Status: AC
  Administered 2014-10-01: 10 mL via INTRAVENOUS

## 2014-10-01 MED ORDER — GADOBENATE DIMEGLUMINE 529 MG/ML IV SOLN: 14.0000 mL | Freq: Once | INTRAVENOUS | Status: DC | PRN

## 2014-10-01 MED ORDER — HEPARIN SOD (PORK) LOCK FLUSH 100 UNIT/ML IV SOLN
500.0000 [IU] | Freq: Once | INTRAVENOUS | Status: AC
  Administered 2014-10-01: 500 [IU] via INTRAVENOUS

## 2014-10-01 NOTE — Progress Notes (Addendum)
Received patient in the clinic to access port for SRS sim. Patient accompanied by her husband. Patient very tearful. Patient reports that she is scared and feels overwhelmed. Attempted to comfort and reassure patient. Patient reports allery to codeine and prednisone but, denies contrast allergy. BUN 18.8 and creatinine 0.7 from 09/29/2014. Patient denies taking metformin, having a pacemaker or a past history of radiation therapy. Access right subclavian power port on the first attempt. Excellent blood return. Site flushed without complication. Patient tolerated well.

## 2014-10-01 NOTE — Progress Notes (Signed)
Patient resting supine on sim table as SRS masked dries. Flushed power port per protocol. Removed access. Needle intact upon removal. Patient tolerated.

## 2014-10-01 NOTE — Progress Notes (Signed)
  Radiation Oncology         [phone]) [phone] ________________________________  Name: Christina Hale  MRN: [id number]  Date: 10/01/2014  DOB: 1951/05/20  SIMULATION AND TREATMENT PLANNING NOTE    ICD-9-CM ICD-10-CM   1. 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   NARRATIVE:  The patient was brought to the [facility].  Identity was confirmed.  All relevant records and images related to the planned course of therapy were reviewed.  The patient freely provided informed written consent to proceed with treatment after reviewing the details related to the planned course of therapy. The consent form was witnessed and verified by the simulation staff. Intravenous access was established for contrast administration. Then, the patient was set-up in a stable reproducible supine position for radiation therapy.  A relocatable thermoplastic stereotactic head frame was fabricated for precise immobilization.  CT images were obtained.  Surface markings were placed.  The CT images were loaded into the planning software and fused with the patient's targeting MRI scan.  Then the target and avoidance structures were contoured.  Treatment planning then occurred.  The radiation prescription was entered and confirmed.  I have requested 3D planning  I have requested a DVH of the following structures: Brain stem, brain, left eye, right eye, lenses, optic chiasm, target volumes, uninvolved brain, and normal tissue.    PLAN:  The patient will receive 27 Gy in 3 fraction to the smaller targets and 21 Gy in 3 fractions to the largest brainstem target.   This document serves as a record of services personally performed by Tyler Pita, MD. It was created on his behalf by Arlyce Harman, a trained medical scribe. The creation of this record is based on the scribe's personal observations and the provider's statements to  them. This document has been checked and approved by the attending provider.     ________________________________  Sheral Apley. Tammi Klippel, M.D.

## 2014-10-02 ENCOUNTER — Ambulatory Visit (INDEPENDENT_AMBULATORY_CARE_PROVIDER_SITE_OTHER): Payer: BC Managed Care – PPO | Admitting: Internal Medicine

## 2014-10-02 ENCOUNTER — Encounter: Payer: Self-pay | Admitting: Internal Medicine

## 2014-10-02 VITALS — BP 152/80 | HR 100 | Temp 97.1°F | Resp 16 | Ht 70.0 in | Wt 148.6 lb

## 2014-10-02 DIAGNOSIS — E0590 Thyrotoxicosis, unspecified without thyrotoxic crisis or storm: Secondary | ICD-10-CM | POA: Insufficient documentation

## 2014-10-02 DIAGNOSIS — Z79899 Other long term (current) drug therapy: Secondary | ICD-10-CM

## 2014-10-02 DIAGNOSIS — E785 Hyperlipidemia, unspecified: Secondary | ICD-10-CM

## 2014-10-02 DIAGNOSIS — I10 Essential (primary) hypertension: Secondary | ICD-10-CM

## 2014-10-02 DIAGNOSIS — R7309 Other abnormal glucose: Secondary | ICD-10-CM

## 2014-10-02 DIAGNOSIS — E559 Vitamin D deficiency, unspecified: Secondary | ICD-10-CM

## 2014-10-02 DIAGNOSIS — R7303 Prediabetes: Secondary | ICD-10-CM

## 2014-10-02 LAB — BASIC METABOLIC PANEL WITH GFR
BUN: 23 mg/dL (ref 6–23)
CO2: 29 meq/L (ref 19–32)
CREATININE: 0.58 mg/dL (ref 0.50–1.10)
Calcium: 9.3 mg/dL (ref 8.4–10.5)
Chloride: 97 mEq/L (ref 96–112)
GFR, Est African American: >89 mL/min
GFR, Est Non African American: >89 mL/min
Glucose, Bld: 101 mg/dL — ABNORMAL HIGH (ref 70–99)
Potassium: 4.4 mEq/L (ref 3.5–5.3)
Sodium: 135 mEq/L (ref 135–145)

## 2014-10-02 LAB — HEPATIC FUNCTION PANEL
ALBUMIN: 3.4 g/dL — AB (ref 3.5–5.2)
ALT: 18 U/L (ref 0–35)
AST: 12 U/L (ref 0–37)
Alkaline Phosphatase: 110 U/L (ref 39–117)
BILIRUBIN INDIRECT: 0.4 mg/dL (ref 0.2–1.2)
Bilirubin, Direct: 0.1 mg/dL (ref 0.0–0.3)
TOTAL PROTEIN: 7.5 g/dL (ref 6.0–8.3)
Total Bilirubin: 0.5 mg/dL (ref 0.2–1.2)

## 2014-10-02 LAB — MAGNESIUM: Magnesium: 1.9 mg/dL (ref 1.5–2.5)

## 2014-10-02 LAB — CBC WITH DIFFERENTIAL/PLATELET
Basophils Absolute: 0 10*3/uL (ref 0.0–0.1)
Basophils Relative: 0 % (ref 0–1)
Eosinophils Absolute: 0 10*3/uL (ref 0.0–0.7)
Eosinophils Relative: 0 % (ref 0–5)
HEMATOCRIT: 44.6 % (ref 36.0–46.0)
Hemoglobin: 15.2 g/dL — ABNORMAL HIGH (ref 12.0–15.0)
LYMPHS ABS: 0.5 10*3/uL — AB (ref 0.7–4.0)
Lymphocytes Relative: 6 % — ABNORMAL LOW (ref 12–46)
MCH: 28.5 pg (ref 26.0–34.0)
MCHC: 34.1 g/dL (ref 30.0–36.0)
MCV: 83.7 fL (ref 78.0–100.0)
MONOS PCT: 2 % — AB (ref 3–12)
MPV: 10.3 fL (ref 8.6–12.4)
Monocytes Absolute: 0.2 10*3/uL (ref 0.1–1.0)
NEUTROS ABS: 8.2 10*3/uL — AB (ref 1.7–7.7)
Neutrophils Relative %: 92 % — ABNORMAL HIGH (ref 43–77)
PLATELETS: 225 10*3/uL (ref 150–400)
RBC: 5.33 MIL/uL — AB (ref 3.87–5.11)
RDW: 14.3 % (ref 11.5–15.5)
WBC: 8.9 10*3/uL (ref 4.0–10.5)

## 2014-10-02 LAB — LIPID PANEL
Cholesterol: 197 mg/dL (ref 0–200)
HDL: 78 mg/dL (ref >=46)
LDL CALC: 99 mg/dL (ref 0–99)
TRIGLYCERIDES: 101 mg/dL (ref <150)
Total CHOL/HDL Ratio: 2.5 Ratio
VLDL: 20 mg/dL (ref 0–40)

## 2014-10-02 LAB — HEMOGLOBIN A1C
Hgb A1c MFr Bld: 5.8 % — ABNORMAL HIGH (ref <5.7)
MEAN PLASMA GLUCOSE: 120 mg/dL — AB (ref <117)

## 2014-10-02 LAB — TSH: TSH: <0.008 u[IU]/mL — ABNORMAL LOW (ref 0.350–4.500)

## 2014-10-02 MED ORDER — ALPRAZOLAM 1 MG PO TABS: ORAL_TABLET | ORAL | Status: DC

## 2014-10-02 NOTE — Patient Instructions (Signed)

## 2014-10-02 NOTE — Progress Notes (Signed)
Patient ID: Christina Hale, female   DOB: 31-Jul-1950, 64 y.o.   MRN: [id number]   This very nice 63 y.o. MWF  presents for  follow up with Hypertension, Hyperlipidemia, Pre-Diabetes and Vitamin D Deficiency. Patient has significant co-morbidity of Lung cancer dx'd Apr 2015 then metastatic to liver and has been on chemotherapy and recently was dx'd with brain mets. She relates she's scheduled for [facility] radiation therapy next week. She's emotionally labile and has been intolerant to SSRI tx in the past, but seems to tolerate low dose alprazolam therapy at present.    Patient also this past year was dx'd with Grave's Dz and treated 17 March with radioactive I131. She's anxious expecting recovery of Nl thyroid function and is reassured that it may take up to 6 months for results of her treatment. Currently she is asymptomatic.    Patient is treated for HTN & BP has been controlled at home. Today's BP: (!) 152/80 mmHg. Patient has had no complaints of any cardiac type chest pain, palpitations, dyspnea/orthopnea/PND, dizziness, claudication, or dependent edema.   Hyperlipidemia is controlled with diet & meds. Patient denies myalgias or other med SE's. Lipids today are at goal - Total Chol 197; HDL 78; LDL  99; Trig 101.   Also, the patient has history of PreDiabetes and has had no symptoms of reactive hypoglycemia, diabetic polys, paresthesias or visual blurring.  Last A1c was 5.8% on 10/02/2014.   Further, the patient also has history of Vitamin D Deficiency of 47 in 2008 on Tx  and now supplements vitamin D without any suspected side-effects. Last vitamin D was  59 on 03/03/2014.     Medication Sig  . Alum & Mag Hydroxide-Simeth (MAGIC MOUTHWASH W/LIDOCAINE) SOLN Take 5 mLs by mouth 4 (four) times daily as needed for mouth pain (swish for 1 minute, then spit).  Marland Kitchen amLODipine  5 MG tablet TAKE 1 TABLET BY MOUTH DAILY  . VITAMIN D 2000 UNITS CAPS Take 4,000 Units by mouth daily.  Marland Kitchen dexamethasone  4 MG  tablet Take 1 tablet (4 mg total) by mouth 2 (two) times daily.  Lebron Quam 5-325 MG per tablet Take 1 tablet by mouth every 6 (six) hours as needed for moderate pain or severe pain.  Marland Kitchen lidocaine (XYLOCAINE) 2 % solution Use as directed 20 mLs in the mouth or throat as needed for mouth pain.  Marland Kitchen MULTIVITAMIN WITH MINERALS   Take 1 tablet by mouth daily.  . ondansetron (ZOFRAN) 4 MG tablet Take 1 tablet (4 mg total) by mouth daily as needed for nausea or vomiting.  Marland Kitchen oxyCODONE (ROXICODONE) 5 MG/5ML solution Take 5 mLs (5 mg total) by mouth every 4 (four) hours as needed for severe pain.  . SENNA S 8.6-50 MG per tablet Take 1 tablet by mouth daily.  . sucralfate1 GM/10ML suspension Take 10 mLs (1 g total) by mouth 4 (four) times daily -  with meals and at bedtime.  . traMADol  50 MG tablet Take 1 tablet (50 mg total) by mouth every 6 (six) hours as needed.  . ALPRAZolam  0.25 MG tablet Take 1-2 tablets every 6 hours as needed for anxiety   Allergies  Allergen Reactions  . Codeine Nausea Only  . Prednisolone Nausea Only   PMHx:   Past Medical History  Diagnosis Date  . Hyperlipidemia   . Hypertension   . Prediabetes   . Vitamin D deficiency   . Anxiety   . Lung cancer   .  Brain cancer     brain mets metastatic lung ca   Immunization History  Administered Date(s) Administered  . Influenza Split 03/21/2013  . Influenza,inj,Quad PF,36+ Mos 02/17/2014  . Pneumococcal-Unspecified 06/06/1998  . Tdap 05/21/2012  . Zoster 05/06/2011   Past Surgical History  Procedure Laterality Date  . Lasik Bilateral 2002  . Carpal tunnel release Right 2000  . Abdominal hysterectomy  1999  . Cesarean section      x2   FHx:    Reviewed / unchanged  SHx:    Reviewed / unchanged  Systems Review:  Constitutional: Denies fever, chills, wt changes, headaches, insomnia, fatigue, night sweats, change in appetite. Eyes: Denies redness, blurred vision, diplopia, discharge, itchy, watery eyes.  ENT:  Denies discharge, congestion, post nasal drip, epistaxis, sore throat, earache, hearing loss, dental pain, tinnitus, vertigo, sinus pain, snoring.  CV: Denies chest pain, palpitations, irregular heartbeat, syncope, dyspnea, diaphoresis, orthopnea, PND, claudication or edema. Respiratory: denies cough, dyspnea, DOE, pleurisy, hoarseness, laryngitis, wheezing.  Gastrointestinal: Denies dysphagia, odynophagia, heartburn, reflux, water brash, abdominal pain or cramps, nausea, vomiting, bloating, diarrhea, constipation, hematemesis, melena, hematochezia  or hemorrhoids. Genitourinary: Denies dysuria, frequency, urgency, nocturia, hesitancy, discharge, hematuria or flank pain. Musculoskeletal: Denies arthralgias, myalgias, stiffness, jt. swelling, pain, limping or strain/sprain.  Skin: Denies pruritus, rash, hives, warts, acne, eczema or change in skin lesion(s). Neuro: No weakness, tremor, incoordination, spasms, paresthesia or pain. Psychiatric: Denies confusion, memory loss or sensory loss. Endo: Denies change in weight, skin or hair change.  Heme/Lymph: No excessive bleeding, bruising or enlarged lymph nodes.  Physical Exam  BP 152/80 mmHg  Pulse 100  Temp(Src) 97.1 F (36.2 C)  Resp 16  Ht '5\' 10"'$  (1.778 m)  Wt 148 lb 9.6 oz (67.405 kg)  BMI 21.32 kg/m2  Appears well nourished and in no distress. Eyes: PERRLA, EOMs, conjunctiva no swelling or erythema. Sinuses: No frontal/maxillary tenderness ENT/Mouth: EAC's clear, TM's nl w/o erythema, bulging. Nares clear w/o erythema, swelling, exudates. Oropharynx clear without erythema or exudates. Oral hygiene is good. Tongue normal, non obstructing. Hearing intact.  Neck: Supple. Thyroid nl. Car 2+/2+ without bruits, nodes or JVD. Chest: Respirations nl with BS clear & equal w/o rales, rhonchi, wheezing or stridor.  Cor: Heart sounds normal w/ regular rate and rhythm without sig. murmurs, gallops, clicks, or rubs. Peripheral pulses normal and equal   without edema.  Abdomen: Soft & bowel sounds normal. Non-tender w/o guarding, rebound, hernias, masses, or organomegaly.  Lymphatics: Unremarkable.  Musculoskeletal: Full ROM all peripheral extremities, joint stability, 5/5 strength, and normal gait.  Skin: Warm, dry without exposed rashes, lesions or ecchymosis apparent.  Neuro: Cranial nerves intact, reflexes equal bilaterally. Sensory-motor testing grossly intact. Tendon reflexes grossly intact.  Pysch: Alert & oriented x 3.  Insight and judgement nl & appropriate. No ideations.  Assessment and Plan:   1. Essential hypertension  - TSH  2. Hyperlipidemia  - Lipid panel  3. Prediabetes  - Hemoglobin A1c - Insulin, random  4. Vitamin D deficiency  - Vit D  25 hydroxy (rtn osteoporosis monitoring)  5. Hyperthyroidism (Tx'd 08/21/2014 w/RAI 131)    6. Medication management  - Urine Microscopic - CBC with Differential/Platelet - BASIC METABOLIC PANEL WITH GFR - Hepatic function panel - Magnesium  7. Anxiety/Depression -    Recommended regular exercise, BP monitoring, weight control, and discussed med and SE's. Recommended labs to assess and monitor clinical status. Further disposition pending results of labs. Over 30 minutes of exam, counseling, chart review  was performed

## 2014-10-03 DIAGNOSIS — Z510 Encounter for antineoplastic radiation therapy: Secondary | ICD-10-CM | POA: Diagnosis not present

## 2014-10-03 LAB — URINALYSIS, MICROSCOPIC ONLY: CRYSTALS: NONE SEEN

## 2014-10-03 LAB — INSULIN, RANDOM: Insulin: 6.4 u[IU]/mL (ref 2.0–19.6)

## 2014-10-03 LAB — VITAMIN D 25 HYDROXY (VIT D DEFICIENCY, FRACTURES): Vit D, 25-Hydroxy: 33 ng/mL (ref 30–100)

## 2014-10-04 ENCOUNTER — Other Ambulatory Visit: Payer: Self-pay | Admitting: Internal Medicine

## 2014-10-04 MED ORDER — CIPROFLOXACIN HCL 250 MG PO TABS: 250.0000 mg | ORAL_TABLET | Freq: Two times a day (BID) | ORAL | Status: DC

## 2014-10-06 ENCOUNTER — Encounter: Payer: Self-pay | Admitting: Radiation Oncology

## 2014-10-06 ENCOUNTER — Ambulatory Visit
Admission: RE | Admit: 2014-10-06 | Discharge: 2014-10-06 | Disposition: A | Discharge status: Home / Self Care | Payer: BC Managed Care – PPO | Source: Ambulatory Visit | Attending: Radiation Oncology | Admitting: Radiation Oncology

## 2014-10-06 VITALS — BP 151/65 | HR 83 | Temp 98.6°F | Resp 16

## 2014-10-06 DIAGNOSIS — Z510 Encounter for antineoplastic radiation therapy: Secondary | ICD-10-CM | POA: Diagnosis not present

## 2014-10-06 DIAGNOSIS — C7931 Secondary malignant neoplasm of brain: Secondary | ICD-10-CM

## 2014-10-06 DIAGNOSIS — C3410 Malignant neoplasm of upper lobe, unspecified bronchus or lung: Secondary | ICD-10-CM

## 2014-10-06 MED ORDER — DEXAMETHASONE 4 MG PO TABS: 4.0000 mg | ORAL_TABLET | Freq: Four times a day (QID) | ORAL | Status: DC

## 2014-10-06 NOTE — Patient Instructions (Signed)
Dexamethasone 4 mg 4 times daily for 1 week,  3 times daily for 1 week,  2 times daily for 1 week,  1/2 pill 2 times daily for 2 weeks,  then 1/2 pill once daily for two weeks,  then stop

## 2014-10-06 NOTE — Op Note (Signed)
Name: Christina Hale    MRN: [id number]   Date: 10/06/2014    DOB: [birth year]   STEREOTACTIC RADIOSURGERY OPERATIVE NOTE  PRE-OPERATIVE DIAGNOSIS:  Metastatic lung CA  POST-OPERATIVE DIAGNOSIS:  Same  PROCEDURE:  Stereotactic Radiosurgery to brainstem and multiple supratentorial lesions  SURGEON:  Consuella Lose, MD  RADIATION ONCOLOGIST: Dr. Tyler Pita, MD  TECHNIQUE:  The patient underwent a radiation treatment planning session in the radiation oncology simulation suite under the care of the radiation oncology physician and physicist.  I participated closely in the radiation treatment planning afterwards. The patient underwent planning CT which was fused to 3T high resolution MRI with 1 mm axial slices.  These images were fused on the planning system.  Radiation oncology contoured the gross target volume and subsequently expanded this to yield the Planning Target Volume. I actively participated in the planning process.  I helped to define and review the target contours and also the contours of the optic pathway, eyes, brainstem and selected nearby organs at risk.  All the dose constraints for critical structures were reviewed and compared to AAPM Task Group 101.  The prescription dose conformity was reviewed.  I approved the plan electronically.    Accordingly, Christina Hale was brought to the TrueBeam stereotactic radiation treatment linac and placed in the custom immobilization mask.  The patient was aligned according to the IR fiducial markers with BrainLab Exactrac, then orthogonal x-rays were used in ExacTrac with the 6DOF robotic table and the shifts were made to align the patient  This lesion was complex because it was within the brainstem.  Christina Hale received stereotactic radiosurgery uneventfully to a prescription dose of 20Gy to the 3 supratentorial lesions, and 7Gy (1 of 3 planned fractions) to the brainstem lesion.  The detailed description of the procedure is recorded in  the radiation oncology procedure note.  I was present for the duration of the procedure.  DISPOSITION:  Following delivery, the patient was transported to nursing in stable condition and monitored for possible acute effects to be discharged to home in stable condition with follow-up in one month.  Consuella Lose, MD [facility]

## 2014-10-06 NOTE — Progress Notes (Signed)
  Radiation Oncology         [phone] ________________________________  Stereotactic Treatment Procedure Note  Name: Addalynn Kumari MRN: [id number]  Date: 10/06/2014  DOB: September 07, 1950  SPECIAL TREATMENT PROCEDURE    ICD-9-CM ICD-10-CM   1. Malignant neoplasm of upper lobe of lung, unspecified laterality 162.3 C34.10 dexamethasone (DECADRON) 4 MG tablet  2. Brain metastases 198.3 C79.31 dexamethasone (DECADRON) 4 MG tablet    3D TREATMENT PLANNING AND DOSIMETRY:  The patient's radiation plan was reviewed and approved by neurosurgery and radiation oncology prior to treatment.  It showed 3-dimensional radiation distributions overlaid onto the planning CT/MRI image set.  The [facility] for the target structures as well as the organs at risk were reviewed. The documentation of the 3D plan and dosimetry are filed in the radiation oncology EMR.  NARRATIVE:  Keniah Klemmer was brought to the TrueBeam stereotactic radiation treatment machine and placed supine on the CT couch. The head frame was applied, and the patient was set up for stereotactic radiosurgery.  Neurosurgery was present for the set-up and delivery  SIMULATION VERIFICATION:  In the couch zero-angle position, the patient underwent Exactrac imaging using the Brainlab system with orthogonal KV images.  These were carefully aligned and repeated to confirm treatment position for each of the isocenters.  The Exactrac snap film verification was repeated at each couch angle.  SPECIAL TREATMENT PROCEDURE: [name] received stereotactic radiosurgery to the following targets: Left pontine 30 mm target was treated using 3 Rapid Arc VMAT Beams to a prescription dose of 7 Gy.  ExacTrac registration was performed for each couch angle.  The 100% isodose line was prescribed.  6 MV X-rays were delivered in the flattening filter free beam mode. Comments: 4 additional smaller targets were treated using 4 Dynamic Conformal Arcs to a prescription dose of 20  Gy.  ExacTrac registration was performed for each couch angle.  The 100% isodose line was prescribed.  6 MV X-rays were delivered in the flattening filter free beam mode.  STEREOTACTIC TREATMENT MANAGEMENT:  Following delivery, the patient was transported to nursing in stable condition and monitored for possible acute effects.  Vital signs were recorded BP 151/65 mmHg  Pulse 83  Temp(Src) 98.6 F (37 C) (Axillary)  Resp 16  SpO2 99%. The patient tolerated treatment without significant acute effects, and was discharged to home in stable condition.    PLAN: Follow-up Wednesday to continue fractionated radiotherapy to the pontine lesion.  ________________________________  Sheral Apley Tammi Klippel, M.D.

## 2014-10-06 NOTE — Progress Notes (Signed)
Patient denies pain, dizziness, nausea and vision changes.  She reports feeling "pressure" in her head from the mask.  Call light in reach and family present in the room.  Will continue to monitor.

## 2014-10-06 NOTE — Progress Notes (Signed)
  Radiation Oncology         (336) [phone] ________________________________  Stereotactic Treatment Procedure Note  Name: Zetta Stoneman MRN: [id number]  Date: 10/08/2014  DOB: [birth year]  SPECIAL TREATMENT PROCEDURE  CURRENT FRACTION:    2  PLANNED FRACTIONS:  3     ICD-9-CM ICD-10-CM   1. Brain metastases 198.3 C79.31     3D TREATMENT PLANNING AND DOSIMETRY:  The patient's radiation plan was reviewed and approved by neurosurgery and radiation oncology prior to treatment.  It showed 3-dimensional radiation distributions overlaid onto the planning CT/MRI image set.  The [facility] for the target structures as well as the organs at risk were reviewed. The documentation of the 3D plan and dosimetry are filed in the radiation oncology EMR.  NARRATIVE:  Daven Pinckney was brought to the TrueBeam stereotactic radiation treatment machine and placed supine on the CT couch. The head frame was applied, and the patient was set up for stereotactic radiosurgery.  Neurosurgery was present for the set-up and delivery  SIMULATION VERIFICATION:  In the couch zero-angle position, the patient underwent Exactrac imaging using the Brainlab system with orthogonal KV images.  These were carefully aligned and repeated to confirm treatment position for each of the isocenters.  The Exactrac snap film verification was repeated at each couch angle.  SPECIAL TREATMENT PROCEDURE: [name] received stereotactic radiosurgery to the following targets: Left PONS 30 mm target was treated using 3 Rapid Arc VMAT Beams to a prescription dose of 7 Gy.  ExacTrac registration was performed for each couch angle.  The 100% isodose line was prescribed.  6 MV X-rays were delivered in the flattening filter free beam mode.   STEREOTACTIC TREATMENT MANAGEMENT:  Following delivery, the patient was transported to nursing in stable condition and monitored for possible acute effects.  Vital signs were recorded BP 155/80 mmHg  Pulse 84   Temp(Src) 97.8 F (36.6 C) (Oral)  Resp 16  SpO2 97%. The patient tolerated treatment without significant acute effects, and was discharged to home in stable condition.    PLAN: Follow-up Friday for final treatment.   ________________________________  Sheral Apley. Tammi Klippel, M.D.

## 2014-10-06 NOTE — Progress Notes (Signed)
Reports taking decadron 4 mg bid. Vitals stable. Denies headache, dizziness, nausea, diplopia or ringing in the ears. Understands to avoid strenuous activity for the next 24 hours and return Wednesday for second treatment. Reports a facial discomfort near her forehead and right cheek related to "that tight mask." Understands to call [phone] with needs. Paged Dr. Tammi Klippel to discuss decadron script prior to discharge.

## 2014-10-08 ENCOUNTER — Ambulatory Visit
Admission: RE | Admit: 2014-10-08 | Discharge: 2014-10-08 | Disposition: A | Discharge status: Home / Self Care | Payer: BC Managed Care – PPO | Source: Ambulatory Visit | Attending: Radiation Oncology | Admitting: Radiation Oncology

## 2014-10-08 ENCOUNTER — Encounter: Payer: Self-pay | Admitting: Radiation Oncology

## 2014-10-08 VITALS — BP 155/80 | HR 84 | Temp 97.8°F | Resp 16

## 2014-10-08 DIAGNOSIS — Z510 Encounter for antineoplastic radiation therapy: Secondary | ICD-10-CM | POA: Diagnosis not present

## 2014-10-08 DIAGNOSIS — C7931 Secondary malignant neoplasm of brain: Secondary | ICD-10-CM

## 2014-10-08 NOTE — Progress Notes (Signed)
Dareen here for post [facility] monitoring for 15 minutes.  She is alert and oriented to person, place and time.  She denies headache, nausea and vision changes.  Call light in reach and family present in the room.

## 2014-10-09 ENCOUNTER — Encounter (HOSPITAL_COMMUNITY): Payer: Self-pay

## 2014-10-09 ENCOUNTER — Ambulatory Visit (HOSPITAL_COMMUNITY)
Admission: RE | Admit: 2014-10-09 | Discharge: 2014-10-09 | Disposition: A | Discharge status: Home / Self Care | Payer: BC Managed Care – PPO | Source: Ambulatory Visit | Attending: Oncology | Admitting: Oncology

## 2014-10-09 DIAGNOSIS — C3412 Malignant neoplasm of upper lobe, left bronchus or lung: Secondary | ICD-10-CM | POA: Insufficient documentation

## 2014-10-10 ENCOUNTER — Encounter: Payer: Self-pay | Admitting: Radiation Oncology

## 2014-10-10 ENCOUNTER — Ambulatory Visit
Admission: RE | Admit: 2014-10-10 | Discharge: 2014-10-10 | Disposition: A | Discharge status: Home / Self Care | Payer: BC Managed Care – PPO | Source: Ambulatory Visit | Attending: Radiation Oncology | Admitting: Radiation Oncology

## 2014-10-10 VITALS — BP 153/77 | HR 93 | Temp 98.0°F | Resp 16

## 2014-10-10 DIAGNOSIS — C7931 Secondary malignant neoplasm of brain: Secondary | ICD-10-CM

## 2014-10-10 DIAGNOSIS — Z510 Encounter for antineoplastic radiation therapy: Secondary | ICD-10-CM | POA: Diagnosis not present

## 2014-10-10 MED ORDER — DIAZEPAM 5 MG PO TABS: 5.0000 mg | ORAL_TABLET | Freq: Four times a day (QID) | ORAL | Status: DC | PRN

## 2014-10-10 NOTE — Progress Notes (Signed)
Nurse monitoring following SRS treatment is complete. Vitals stable. Patient denies pain, diplopia, ringing in the ears, nausea, headache or dizziness. Reports taking decadron 4 mg qid. Valium script given. Both patient and husband understand not to take ativan and valium together and to only use valium before bed. Understands to avoid strenuous activity for the next 24 hours and to call [phone] with needs. Mont Dutton gave patient her one month follow up appointment card.

## 2014-10-10 NOTE — Progress Notes (Signed)
  Radiation Oncology         (336) [phone] ________________________________  Stereotactic Treatment Procedure Note  Name: Christina Hale  MRN: [id number]  Date: 10/10/2014  DOB: [birth year]  SPECIAL TREATMENT PROCEDURE  CURRENT FRACTION:   3  PLANNED FRACTIONS:  3     ICD-9-CM ICD-10-CM   1. Brain metastases 198.3 C79.31 diazepam (VALIUM) 5 MG tablet    3D TREATMENT PLANNING AND DOSIMETRY:  The patient's radiation plan was reviewed and approved by neurosurgery and radiation oncology prior to treatment.  It showed 3-dimensional radiation distributions overlaid onto the planning CT/MRI image set.  The [facility] for the target structures as well as the organs at risk were reviewed. The documentation of the 3D plan and dosimetry are filed in the radiation oncology EMR.  NARRATIVE:  Christina Hale was brought to the TrueBeam stereotactic radiation treatment machine and placed supine on the CT couch. The head frame was applied, and the patient was set up for stereotactic radiosurgery.  Neurosurgery was present for the set-up and delivery  SIMULATION VERIFICATION:  In the couch zero-angle position, the patient underwent Exactrac imaging using the Brainlab system with orthogonal KV images.  These were carefully aligned and repeated to confirm treatment position for each of the isocenters.  The Exactrac snap film verification was repeated at each couch angle.  SPECIAL TREATMENT PROCEDURE: Christina Hale received stereotactic radiosurgery to the following targets: Left PONS 30 mm target was treated using 3 Rapid Arc VMAT Beams to a prescription dose of 7 Gy.  ExacTrac registration was performed for each couch angle.  The 100% isodose line was prescribed.  6 MV X-rays were delivered in the flattening filter free beam mode.   STEREOTACTIC TREATMENT MANAGEMENT:  Following delivery, the patient was transported to nursing in stable condition and monitored for possible acute effects.  Vital signs were recorded BP  153/77 mmHg  Pulse 93  Temp(Src) 98 F (36.7 C) (Oral)  Resp 16  SpO2 100%. The patient tolerated treatment without significant acute effects, and was discharged to home in stable condition.    PLAN: Complete, follow-up in one month.  Has steroid taper instructions  ________________________________  Sheral Apley. Tammi Klippel, M.D.

## 2014-10-13 ENCOUNTER — Telehealth: Payer: Self-pay | Admitting: Oncology

## 2014-10-13 ENCOUNTER — Other Ambulatory Visit: Payer: Self-pay | Admitting: *Deleted

## 2014-10-13 ENCOUNTER — Ambulatory Visit (HOSPITAL_BASED_OUTPATIENT_CLINIC_OR_DEPARTMENT_OTHER): Payer: BC Managed Care – PPO | Admitting: Oncology

## 2014-10-13 VITALS — BP 161/80 | HR 89 | Temp 98.0°F | Resp 18 | Ht 70.0 in | Wt 151.1 lb

## 2014-10-13 DIAGNOSIS — Z5111 Encounter for antineoplastic chemotherapy: Secondary | ICD-10-CM

## 2014-10-13 DIAGNOSIS — C3412 Malignant neoplasm of upper lobe, left bronchus or lung: Secondary | ICD-10-CM | POA: Diagnosis not present

## 2014-10-13 DIAGNOSIS — C7951 Secondary malignant neoplasm of bone: Secondary | ICD-10-CM

## 2014-10-13 DIAGNOSIS — C787 Secondary malignant neoplasm of liver and intrahepatic bile duct: Secondary | ICD-10-CM

## 2014-10-13 DIAGNOSIS — B370 Candidal stomatitis: Secondary | ICD-10-CM

## 2014-10-13 DIAGNOSIS — C7931 Secondary malignant neoplasm of brain: Secondary | ICD-10-CM | POA: Diagnosis not present

## 2014-10-13 DIAGNOSIS — F411 Generalized anxiety disorder: Secondary | ICD-10-CM

## 2014-10-13 MED ORDER — CYANOCOBALAMIN 1000 MCG/ML IJ SOLN
1000.0000 ug | Freq: Once | INTRAMUSCULAR | Status: AC
  Administered 2014-10-13: 1000 ug via SUBCUTANEOUS

## 2014-10-13 MED ORDER — CYANOCOBALAMIN 1000 MCG/ML IJ SOLN
INTRAMUSCULAR | Status: AC
  Filled 2014-10-13: qty 1

## 2014-10-13 MED ORDER — FOLIC ACID 1 MG PO TABS: 1.0000 mg | ORAL_TABLET | Freq: Every day | ORAL | Status: DC

## 2014-10-13 MED ORDER — CLOTRIMAZOLE 10 MG MT TROC: 10.0000 mg | Freq: Three times a day (TID) | OROMUCOSAL | Status: DC

## 2014-10-13 MED ORDER — FLUCONAZOLE 100 MG PO TABS: 100.0000 mg | ORAL_TABLET | Freq: Every day | ORAL | Status: DC

## 2014-10-13 NOTE — Telephone Encounter (Signed)
Gave and printed appt sched and avs for pt for May and June  °

## 2014-10-13 NOTE — Progress Notes (Signed)
[place] OFFICE PROGRESS NOTE   Diagnosis:  Non-small cell lung cancer  INTERVAL HISTORY:    Christina Hale returns as scheduled. She completed SRS treatment  To multiple brain lesions last week. She has been prescribed a Decadron taper by Dr. Tammi Klippel.  She has noted partial improvement in the right-sided weakness. She complains of feeling emotional and anxious while on Decadron. Valium did not help. Xanax helps partially.  she no longer has throat/neck pain.  Objective:  Vital signs in last 24 hours:  Blood pressure 161/80, pulse 89, temperature 98 F (36.7 C), temperature source Oral, resp. rate 18, height 5' 10"  (1.778 m), weight 151 lb 1.6 oz (68.539 kg), SpO2 97 %.    HEENT:  Thrush at the pharynx bilaterally Lymphatics:  No cervical or supraclavicular nodes Resp:  Lungs clear bilaterally Cardio:  Regular rate and rhythm GI:  No hepatosplenomegaly, nontender Vascular:  No leg edema Neuro: alert and oriented, follows commands, the motor exam appears grossly intact in the upper and lower extremities with 4/5 strength of the right arm and leg    Portacath/PICC-without erythema  Lab Results:  Lab Results  Component Value Date   WBC 8.9 10/02/2014   HGB 15.2* 10/02/2014   HCT 44.6 10/02/2014   MCV 83.7 10/02/2014   PLT 225 10/02/2014   NEUTROABS 8.2* 10/02/2014     Medications: I have reviewed the patient's current medications.  Assessment/Plan: 1. Stage IV adenocarcinoma of the lung, dominant left upper lobe mass and spiculated right upper lobe mass  Status post a biopsy of a left hepatic mass confirming metastatic adenocarcinoma consistent with a lung primary, ALK mutation negative, EGFR amplification suboptimal-repeat testing recommend   Staging CT scans 09/27/2013 confirmed a dominant left upper lung mass, a smaller right upper lung mass, left hepatic mass, and left external iliac node.   Cycle 1 Taxol/carboplatin and Avastin per the CTSU  O2703 study 10/09/2013.   Cycle 2 Taxol/carboplatin and Avastin 10/29/2013.   Restaging CT evaluation 11/14/2013 with improvement in the lungs and liver. Left external iliac lymph node was stable.   Cycle 3 Taxol/carboplatin and Avastin 12/02/2013   Restaging CT 01/02/2014 with a mixed response-slightly larger left upper lung mass and mediastinal lymph node, slightly smaller liver mass and iliac node   Maintenance Avastin beginning 01/06/2014   Restaging CT 03/06/2014 with a decrease in the size of the lung masses and a liver lesion  Restaging CT 05/22/2014 with a stable right upper lung nodule, decreased size of a left upper lobe lung mass, increased size of a liver lesion, stable left pelvic nodule/node.  Restaging CT 07/24/2014 with no significant change in the chest, increase in size of the mass within the left hepatic lobe, 2 retroperitoneal lymph nodes increased in size, stable appearance of peritoneal nodules.   brain/ skull metastases documented on an MRI of the brain/cervical spine 09/23/2014, taken off of the E5508 study   Restaging CTs 10/09/2014 -slight decrease in size of the left upper lobe cavitary mass , enlargement of the dominant left liver lesion, no significant change in the size of abdominal or chest lymph nodes except for enlargement of a subcarinal node 2. C. difficile colitis March 2015 3. Hyponatremia/seizure on hospital admission 08/29/2013 4. Chronic right hip "bursitis". 5. Nausea and vomiting following cycle 1 Taxol/carboplatin, the anti-emetic regimen was adjusted with cycle 2. 6. Severe neutropenia following cycle 1 Taxol/carboplatin-Neulasta added with cycle 2. 7. 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  Improved. 9. Anemia. Likely secondary to chemotherapy. She received red cell transfusions 12/20/2013 and 12/21/2013. Improved. 10. Hoarseness-improved, potentially related to toxicity from chemotherapy versus  tumor. 11. Right retinal detachment 04/05/2014, status post surgical repair 04/07/2014 12. Low TSH with a borderline elevated T4- she was diagnosed with hyperthyroidism, status post radioactive iodine treatment on 08/21/2014  13. History of Proteinuria-mild 14. Decreased right nasolabial fold and right arm/leg weakness 09/22/2014  MRI brain/cervical spine 09/23/2014 revealed 5 brain metastases including a 3.1 cm brainstem metastasis in the left pons, 2 left posterior skull metastases, and a C1 metastasis  Decadron started 09/23/2014 15. Neck pain, pain with swallowing-likely related to thyroid inflammation from I-131 therapy, improved with Decadron  Disposition:   Christina Hale has completed SRS treatment to metastatic brain lesions. She will complete a Decadron taper as prescribed by Dr. Tammi Klippel.  there is clinical and x-ray evidence of disease progression. We discussed treatment options. I recommend Alimta and Avastin.   I explained the benefit associated with second line systemic therapy. We reviewed the potential toxicities associated with Alimta and she agrees to proceed. She will begin folic acid an she was given a dose of vitamin B-12 today. The plan is to begin a first cycle of Alimta/ Avastin 10/20/2014.  Christina Hale will continues Xanax for anxiety. She will follow-up with Dr. Melford Aase for management of thyroid hormone replacement.   She will begin Diflucan/Mycelex throches  for the oral candidiasis. She will be scheduled for an office visit and second cycle of salvage therapy on 11/10/2014.  Betsy Coder, MD  10/13/2014  10:51 AM

## 2014-10-19 ENCOUNTER — Other Ambulatory Visit: Payer: Self-pay | Admitting: Oncology

## 2014-10-20 ENCOUNTER — Ambulatory Visit: Payer: BC Managed Care – PPO

## 2014-10-20 ENCOUNTER — Ambulatory Visit (HOSPITAL_BASED_OUTPATIENT_CLINIC_OR_DEPARTMENT_OTHER): Payer: BC Managed Care – PPO | Admitting: Oncology

## 2014-10-20 ENCOUNTER — Telehealth: Payer: Self-pay | Admitting: Oncology

## 2014-10-20 ENCOUNTER — Ambulatory Visit (HOSPITAL_BASED_OUTPATIENT_CLINIC_OR_DEPARTMENT_OTHER): Payer: BC Managed Care – PPO

## 2014-10-20 ENCOUNTER — Other Ambulatory Visit (HOSPITAL_BASED_OUTPATIENT_CLINIC_OR_DEPARTMENT_OTHER): Payer: BC Managed Care – PPO

## 2014-10-20 VITALS — BP 106/66 | HR 89 | Resp 18 | Ht 70.0 in | Wt 158.5 lb

## 2014-10-20 DIAGNOSIS — C787 Secondary malignant neoplasm of liver and intrahepatic bile duct: Secondary | ICD-10-CM

## 2014-10-20 DIAGNOSIS — C3412 Malignant neoplasm of upper lobe, left bronchus or lung: Secondary | ICD-10-CM

## 2014-10-20 DIAGNOSIS — C7931 Secondary malignant neoplasm of brain: Secondary | ICD-10-CM

## 2014-10-20 DIAGNOSIS — Z5112 Encounter for antineoplastic immunotherapy: Secondary | ICD-10-CM | POA: Diagnosis not present

## 2014-10-20 DIAGNOSIS — Z95828 Presence of other vascular implants and grafts: Secondary | ICD-10-CM

## 2014-10-20 DIAGNOSIS — C7951 Secondary malignant neoplasm of bone: Secondary | ICD-10-CM | POA: Diagnosis not present

## 2014-10-20 DIAGNOSIS — Z5111 Encounter for antineoplastic chemotherapy: Secondary | ICD-10-CM

## 2014-10-20 LAB — CBC WITH DIFFERENTIAL/PLATELET
BASO%: 0.2 % (ref 0.0–2.0)
BASOS ABS: 0 10*3/uL (ref 0.0–0.1)
EOS ABS: 0 10*3/uL (ref 0.0–0.5)
EOS%: 0.2 % (ref 0.0–7.0)
HCT: 46 % (ref 34.8–46.6)
HGB: 15.7 g/dL (ref 11.6–15.9)
LYMPH%: 4.4 % — AB (ref 14.0–49.7)
MCH: 28.8 pg (ref 25.1–34.0)
MCHC: 34.1 g/dL (ref 31.5–36.0)
MCV: 84.4 fL (ref 79.5–101.0)
MONO#: 0.4 10*3/uL (ref 0.1–0.9)
MONO%: 2.8 % (ref 0.0–14.0)
NEUT%: 92.4 % — ABNORMAL HIGH (ref 38.4–76.8)
NEUTROS ABS: 12.3 10*3/uL — AB (ref 1.5–6.5)
PLATELETS: 109 10*3/uL — AB (ref 145–400)
RBC: 5.45 10*6/uL (ref 3.70–5.45)
RDW: 16 % — ABNORMAL HIGH (ref 11.2–14.5)
WBC: 13.3 10*3/uL — ABNORMAL HIGH (ref 3.9–10.3)
lymph#: 0.6 10*3/uL — ABNORMAL LOW (ref 0.9–3.3)
nRBC: 0 % (ref 0–0)

## 2014-10-20 LAB — COMPREHENSIVE METABOLIC PANEL (CC13)
ALT: 80 U/L — ABNORMAL HIGH (ref 0–55)
AST: 23 U/L (ref 5–34)
Albumin: 2.7 g/dL — ABNORMAL LOW (ref 3.5–5.0)
Alkaline Phosphatase: 110 U/L (ref 40–150)
Anion Gap: 10 mEq/L (ref 3–11)
BUN: 16.7 mg/dL (ref 7.0–26.0)
CALCIUM: 8.2 mg/dL — AB (ref 8.4–10.4)
CO2: 28 mEq/L (ref 22–29)
CREATININE: 0.6 mg/dL (ref 0.6–1.1)
Chloride: 98 mEq/L (ref 98–109)
Glucose: 94 mg/dl (ref 70–140)
Potassium: 4.4 mEq/L (ref 3.5–5.1)
Sodium: 136 mEq/L (ref 136–145)
Total Bilirubin: 0.35 mg/dL (ref 0.20–1.20)
Total Protein: 5.7 g/dL — ABNORMAL LOW (ref 6.4–8.3)

## 2014-10-20 LAB — UA PROTEIN, DIPSTICK - CHCC

## 2014-10-20 MED ORDER — ALPRAZOLAM 0.25 MG PO TABS: 0.2500 mg | ORAL_TABLET | Freq: Three times a day (TID) | ORAL | Status: DC | PRN

## 2014-10-20 MED ORDER — HEPARIN SOD (PORK) LOCK FLUSH 100 UNIT/ML IV SOLN
500.0000 [IU] | Freq: Once | INTRAVENOUS | Status: AC | PRN
  Administered 2014-10-20: 500 [IU]
  Filled 2014-10-20: qty 5

## 2014-10-20 MED ORDER — SODIUM CHLORIDE 0.9 % IV SOLN
Freq: Once | INTRAVENOUS | Status: AC
  Administered 2014-10-20: 10:00:00 via INTRAVENOUS

## 2014-10-20 MED ORDER — SODIUM CHLORIDE 0.9 % IJ SOLN
10.0000 mL | INTRAMUSCULAR | Status: DC | PRN
  Administered 2014-10-20: 10 mL
  Filled 2014-10-20: qty 10

## 2014-10-20 MED ORDER — PEMETREXED DISODIUM CHEMO INJECTION 500 MG
540.0000 mg/m2 | Freq: Once | INTRAVENOUS | Status: AC
  Administered 2014-10-20: 1000 mg via INTRAVENOUS
  Filled 2014-10-20: qty 40

## 2014-10-20 MED ORDER — SODIUM CHLORIDE 0.9 % IV SOLN
Freq: Once | INTRAVENOUS | Status: AC
  Administered 2014-10-20: 11:00:00 via INTRAVENOUS
  Filled 2014-10-20: qty 4

## 2014-10-20 MED ORDER — SODIUM CHLORIDE 0.9 % IJ SOLN
10.0000 mL | INTRAMUSCULAR | Status: DC | PRN
  Administered 2014-10-20: 10 mL via INTRAVENOUS
  Filled 2014-10-20: qty 10

## 2014-10-20 MED ORDER — SODIUM CHLORIDE 0.9 % IV SOLN
1075.0000 mg | Freq: Once | INTRAVENOUS | Status: AC
  Administered 2014-10-20: 1075 mg via INTRAVENOUS
  Filled 2014-10-20: qty 43

## 2014-10-20 NOTE — Progress Notes (Signed)
[facility] OFFICE PROGRESS NOTE   Diagnosis: Non-small cell lung cancer  INTERVAL HISTORY:   Christina Hale returns as scheduled. She reports feeling "weak ". The thrush resolved with the Diflucan. She has persistent right-sided weakness. She continues a Decadron taper prescribed by Dr. Tammi Klippel  Objective:  Vital signs in last 24 hours:  Blood pressure 106/66, pulse 89, resp. rate 18, height 5' 10"  (1.778 m), weight 158 lb 8 oz (71.895 kg), SpO2 99 %.    HEENT: No thrush Resp: Lungs clear bilaterally Cardio: Regular rate and rhythm GI: No hepatosplenomegaly Vascular: Trace right greater than left ankle edema Neuro: 4/5 strength in the right arm and leg    Portacath/PICC-without erythema  Lab Results:  Lab Results  Component Value Date   WBC 13.3* 10/20/2014   HGB 15.7 10/20/2014   HCT 46.0 10/20/2014   MCV 84.4 10/20/2014   PLT 109* 10/20/2014   NEUTROABS 12.3* 10/20/2014     Medications: I have reviewed the patient's current medications.  Assessment/Plan: 1. Stage IV adenocarcinoma of the lung, dominant left upper lobe mass and spiculated right upper lobe mass  Status post a biopsy of a left hepatic mass confirming metastatic adenocarcinoma consistent with a lung primary, ALK mutation negative, EGFR amplification suboptimal-repeat testing recommend   Staging CT scans 09/27/2013 confirmed a dominant left upper lung mass, a smaller right upper lung mass, left hepatic mass, and left external iliac node.   Cycle 1 Taxol/carboplatin and Avastin per the CTSU C7893 study 10/09/2013.   Cycle 2 Taxol/carboplatin and Avastin 10/29/2013.   Restaging CT evaluation 11/14/2013 with improvement in the lungs and liver. Left external iliac lymph node was stable.   Cycle 3 Taxol/carboplatin and Avastin 12/02/2013   Restaging CT 01/02/2014 with a mixed response-slightly larger left upper lung mass and mediastinal lymph node, slightly smaller liver mass and  iliac node   Maintenance Avastin beginning 01/06/2014   Restaging CT 03/06/2014 with a decrease in the size of the lung masses and a liver lesion  Restaging CT 05/22/2014 with a stable right upper lung nodule, decreased size of a left upper lobe lung mass, increased size of a liver lesion, stable left pelvic nodule/node.  Restaging CT 07/24/2014 with no significant change in the chest, increase in size of the mass within the left hepatic lobe, 2 retroperitoneal lymph nodes increased in size, stable appearance of peritoneal nodules.  brain/ skull metastases documented on an MRI of the brain/cervical spine 09/23/2014, taken off of the E5508 study  Restaging CTs 10/09/2014 -slight decrease in size of the left upper lobe cavitary mass , enlargement of the dominant left liver lesion, no significant change in the size of abdominal or chest lymph nodes except for enlargement of a subcarinal node  Cycle 1 Alimta/Avastin 10/20/2014 2. C. difficile colitis March 2015 3. Hyponatremia/seizure on hospital admission 08/29/2013 4. Chronic right hip "bursitis". 5. Nausea and vomiting following cycle 1 Taxol/carboplatin, the anti-emetic regimen was adjusted with cycle 2. 6. Severe neutropenia following cycle 1 Taxol/carboplatin-Neulasta added with cycle 2. 7. 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 9. Anemia. Likely secondary to chemotherapy. She received red cell transfusions 12/20/2013 and 12/21/2013. Improved. 10. Hoarseness-improved, potentially related to toxicity from chemotherapy versus tumor. 11. Right retinal detachment 04/05/2014, status post surgical repair 04/07/2014 12. Low TSH with a borderline elevated T4- she was diagnosed with hyperthyroidism, status post radioactive iodine treatment on 08/21/2014  13. History of Proteinuria-mild 14. Decreased right nasolabial fold and  right arm/leg weakness 09/22/2014  MRI brain/cervical spine  09/23/2014 revealed 5 brain metastases including a 3.1 cm brainstem metastasis in the left pons, 2 left posterior skull metastases, and a C1 metastasis  Decadron started 09/23/2014 15. Neck pain, pain with swallowing-likely related to thyroid inflammation from I-131 therapy, improved with Decadron    Disposition:  Christina Hale appears unchanged. The plan is to begin Alimta/Avastin chemotherapy today. She will return for an office visit and chemotherapy in 3 weeks.  Betsy Coder, MD  10/20/2014  9:55 AM

## 2014-10-20 NOTE — Patient Instructions (Signed)

## 2014-10-20 NOTE — Telephone Encounter (Signed)
Pt confirmed labs/ov per 05/16 POF, gave pt AVS and Calendar.... KJ °

## 2014-10-20 NOTE — Telephone Encounter (Signed)
Appointments added per pof,a calendar will be mailed to the patient,she as well is on mychart

## 2014-10-21 ENCOUNTER — Telehealth: Payer: Self-pay | Admitting: *Deleted

## 2014-10-21 NOTE — Telephone Encounter (Signed)
-----   Message from Domenic Schwab, RN sent at 10/21/2014 10:00 AM EDT ----- Regarding: Sherrill---Chemo Follow Up 1st Alimta

## 2014-10-21 NOTE — Telephone Encounter (Signed)
Called pt to follow up after first Alimta tx. She denies any N/V. Reviewed side effect management. Encouraged them to call office as issues arise. Reports R foot remains swollen, they are elevating legs during the day. Suggested she try compression hose, she has some at home. She and husband voiced understanding.

## 2014-10-23 ENCOUNTER — Ambulatory Visit: Payer: BC Managed Care – PPO | Admitting: Internal Medicine

## 2014-10-26 NOTE — Progress Notes (Signed)
  Radiation Oncology         (336) [phone] ________________________________  Name: Christina Hale MRN: [id number]  Date: 10/10/2014  DOB: [birth year]  End of Treatment Note  ICD-9-CM ICD-10-CM     1. Brain metastases 198.3 C79.31     DIAGNOSIS: 63 yo woman with multiple brain metastases from metastatic cancer of the left upper lung.     Indication for treatment:  Palliation       Radiation treatment dates:   10/06/2014, 10/08/2014, 10/10/2014  Site/dose/beams/energy:   [name] received stereotactic radiosurgery to the following targets:  1.  Left pontine 30 mm target was treated using 3 Rapid Arc VMAT Beams to a total prescription dose of 21 Gy in 3 fractions of 7 Gy.  ExacTrac registration was performed for each couch angle.  The 100% isodose line was prescribed.  6 MV X-rays were delivered in the flattening filter free beam mode.  1.  4 additional smaller brain metastasis targets were treated using 4 Dynamic Conformal Arcs to a prescription dose of 20 Gy.  ExacTrac registration was performed for each couch angle.  The 100% isodose line was prescribed.  6 MV X-rays were delivered in the flattening filter free beam mode.  Narrative: The patient tolerated radiation treatment relatively well.     Plan: The patient has completed radiation treatment. The patient will return to radiation oncology clinic for routine followup in one month. I advised her to call or return sooner if she has any questions or concerns related to her recovery or treatment. ________________________________  Sheral Apley. Tammi Klippel, M.D.

## 2014-11-03 ENCOUNTER — Other Ambulatory Visit: Payer: Self-pay | Admitting: Oncology

## 2014-11-06 ENCOUNTER — Ambulatory Visit (INDEPENDENT_AMBULATORY_CARE_PROVIDER_SITE_OTHER): Payer: Self-pay | Admitting: Internal Medicine

## 2014-11-06 DIAGNOSIS — R69 Illness, unspecified: Secondary | ICD-10-CM

## 2014-11-06 NOTE — Progress Notes (Signed)
Patient ID: Christina Hale, female   DOB: Aug 29, 1950, 64 y.o.   MRN: [id number] Christina Hale

## 2014-11-07 ENCOUNTER — Other Ambulatory Visit: Payer: Self-pay | Admitting: Oncology

## 2014-11-10 ENCOUNTER — Other Ambulatory Visit: Payer: Self-pay | Admitting: Nurse Practitioner

## 2014-11-10 ENCOUNTER — Ambulatory Visit (HOSPITAL_BASED_OUTPATIENT_CLINIC_OR_DEPARTMENT_OTHER): Payer: BC Managed Care – PPO

## 2014-11-10 ENCOUNTER — Ambulatory Visit
Admission: RE | Admit: 2014-11-10 | Discharge: 2014-11-10 | Disposition: A | Discharge status: Home / Self Care | Payer: BC Managed Care – PPO | Source: Ambulatory Visit | Attending: Radiation Oncology | Admitting: Radiation Oncology

## 2014-11-10 ENCOUNTER — Ambulatory Visit (HOSPITAL_BASED_OUTPATIENT_CLINIC_OR_DEPARTMENT_OTHER): Payer: BC Managed Care – PPO | Admitting: Nurse Practitioner

## 2014-11-10 ENCOUNTER — Encounter: Payer: Self-pay | Admitting: Radiation Oncology

## 2014-11-10 ENCOUNTER — Other Ambulatory Visit (HOSPITAL_BASED_OUTPATIENT_CLINIC_OR_DEPARTMENT_OTHER): Payer: BC Managed Care – PPO

## 2014-11-10 ENCOUNTER — Telehealth: Payer: Self-pay | Admitting: Oncology

## 2014-11-10 VITALS — BP 143/77 | HR 96 | Temp 97.6°F | Resp 18 | Ht 70.0 in | Wt 165.4 lb

## 2014-11-10 VITALS — BP 120/81 | HR 82

## 2014-11-10 VITALS — BP 140/74 | HR 78 | Resp 16 | Wt 165.4 lb

## 2014-11-10 DIAGNOSIS — Z5111 Encounter for antineoplastic chemotherapy: Secondary | ICD-10-CM

## 2014-11-10 DIAGNOSIS — Z5112 Encounter for antineoplastic immunotherapy: Secondary | ICD-10-CM | POA: Diagnosis not present

## 2014-11-10 DIAGNOSIS — C7931 Secondary malignant neoplasm of brain: Secondary | ICD-10-CM

## 2014-11-10 DIAGNOSIS — C3410 Malignant neoplasm of upper lobe, unspecified bronchus or lung: Secondary | ICD-10-CM

## 2014-11-10 DIAGNOSIS — C3412 Malignant neoplasm of upper lobe, left bronchus or lung: Secondary | ICD-10-CM

## 2014-11-10 DIAGNOSIS — C7951 Secondary malignant neoplasm of bone: Secondary | ICD-10-CM | POA: Diagnosis not present

## 2014-11-10 DIAGNOSIS — C787 Secondary malignant neoplasm of liver and intrahepatic bile duct: Secondary | ICD-10-CM

## 2014-11-10 DIAGNOSIS — N390 Urinary tract infection, site not specified: Secondary | ICD-10-CM

## 2014-11-10 LAB — COMPREHENSIVE METABOLIC PANEL (CC13)
ALT: 41 U/L (ref 0–55)
ANION GAP: 10 meq/L (ref 3–11)
AST: 18 U/L (ref 5–34)
Albumin: 2.5 g/dL — ABNORMAL LOW (ref 3.5–5.0)
Alkaline Phosphatase: 107 U/L (ref 40–150)
BUN: 14.4 mg/dL (ref 7.0–26.0)
CALCIUM: 8.9 mg/dL (ref 8.4–10.4)
CO2: 30 mEq/L — ABNORMAL HIGH (ref 22–29)
Chloride: 97 mEq/L — ABNORMAL LOW (ref 98–109)
Creatinine: 0.7 mg/dL (ref 0.6–1.1)
EGFR: >90 mL/min/{1.73_m2} (ref >90)
GLUCOSE: 97 mg/dL (ref 70–140)
POTASSIUM: 3.8 meq/L (ref 3.5–5.1)
Sodium: 137 mEq/L (ref 136–145)
Total Bilirubin: 0.29 mg/dL (ref 0.20–1.20)
Total Protein: 6.4 g/dL (ref 6.4–8.3)

## 2014-11-10 LAB — CBC WITH DIFFERENTIAL/PLATELET
BASO%: 0.7 % (ref 0.0–2.0)
Basophils Absolute: 0.1 10*3/uL (ref 0.0–0.1)
EOS%: 0.1 % (ref 0.0–7.0)
Eosinophils Absolute: 0 10*3/uL (ref 0.0–0.5)
HEMATOCRIT: 41.2 % (ref 34.8–46.6)
HEMOGLOBIN: 13.6 g/dL (ref 11.6–15.9)
LYMPH%: 6.9 % — ABNORMAL LOW (ref 14.0–49.7)
MCH: 28.1 pg (ref 25.1–34.0)
MCHC: 33 g/dL (ref 31.5–36.0)
MCV: 85.3 fL (ref 79.5–101.0)
MONO#: 0.4 10*3/uL (ref 0.1–0.9)
MONO%: 3 % (ref 0.0–14.0)
NEUT#: 11 10*3/uL — ABNORMAL HIGH (ref 1.5–6.5)
NEUT%: 89.3 % — ABNORMAL HIGH (ref 38.4–76.8)
PLATELETS: 153 10*3/uL (ref 145–400)
RBC: 4.83 10*6/uL (ref 3.70–5.45)
RDW: 18.7 % — ABNORMAL HIGH (ref 11.2–14.5)
WBC: 12.3 10*3/uL — ABNORMAL HIGH (ref 3.9–10.3)
lymph#: 0.8 10*3/uL — ABNORMAL LOW (ref 0.9–3.3)

## 2014-11-10 LAB — URINALYSIS, MICROSCOPIC - CHCC
Bilirubin (Urine): NEGATIVE
GLUCOSE UR CHCC: NEGATIVE mg/dL
KETONES: NEGATIVE mg/dL
Nitrite: POSITIVE
PH: 6 (ref 4.6–8.0)
SPECIFIC GRAVITY, URINE: 1.01 (ref 1.003–1.035)
Urobilinogen, UR: 0.2 mg/dL (ref 0.2–1)

## 2014-11-10 LAB — TECHNOLOGIST REVIEW

## 2014-11-10 MED ORDER — SODIUM CHLORIDE 0.9 % IV SOLN
1000.0000 mg | Freq: Once | INTRAVENOUS | Status: AC
  Administered 2014-11-10: 1000 mg via INTRAVENOUS
  Filled 2014-11-10: qty 40

## 2014-11-10 MED ORDER — SODIUM CHLORIDE 0.9 % IV SOLN: 15.0000 mg/kg | Freq: Once | INTRAVENOUS | Status: DC

## 2014-11-10 MED ORDER — SODIUM CHLORIDE 0.9 % IV SOLN
Freq: Once | INTRAVENOUS | Status: AC
  Administered 2014-11-10: 12:00:00 via INTRAVENOUS
  Filled 2014-11-10: qty 4

## 2014-11-10 MED ORDER — HEPARIN SOD (PORK) LOCK FLUSH 100 UNIT/ML IV SOLN
500.0000 [IU] | Freq: Once | INTRAVENOUS | Status: AC | PRN
  Administered 2014-11-10: 500 [IU]
  Filled 2014-11-10: qty 5

## 2014-11-10 MED ORDER — SODIUM CHLORIDE 0.9 % IJ SOLN
10.0000 mL | INTRAMUSCULAR | Status: DC | PRN
  Administered 2014-11-10: 10 mL
  Filled 2014-11-10: qty 10

## 2014-11-10 MED ORDER — SODIUM CHLORIDE 0.9 % IV SOLN
16.0000 mg/kg | Freq: Once | INTRAVENOUS | Status: AC
  Administered 2014-11-10: 1100 mg via INTRAVENOUS
  Filled 2014-11-10: qty 44

## 2014-11-10 MED ORDER — SODIUM CHLORIDE 0.9 % IV SOLN
Freq: Once | INTRAVENOUS | Status: AC
  Administered 2014-11-10: 12:00:00 via INTRAVENOUS

## 2014-11-10 NOTE — Progress Notes (Signed)
Weight and vitals stable. Steady gait noted with assistance of cane. Patient confirms gait has greatly improved. Reports anxiety for which she take xanax. Denies headache, nausea, or vomiting. Reports blurry vision related to effects of detached retina in right eye. Reports PCP is attempting to manage hyperactive thyroid. Denies ringing in the ears. Reports taking Decadron 2 mg once per day starting tomorrow. No thrush noted.

## 2014-11-10 NOTE — Progress Notes (Signed)
[place] OFFICE PROGRESS NOTE   Diagnosis:  Non-small cell lung cancer  INTERVAL HISTORY:   [name] returns as scheduled. She completed cycle 1 Alimta/Avastin 10/20/2014. She denies nausea/vomiting. No mouth sores. No diarrhea or constipation. No rash. She denies numbness or tingling in her hands or feet. Her balance is "a little better". She is walking with a cane. No falls. No bleeding except with skin tears. She denies abdominal pain. No shortness of breath or chest pain. No leg swelling or calf pain. She has noted recent difficulty sleeping. She is taking Xanax and oxycodone at bedtime for sleep.  Objective:  Vital signs in last 24 hours:  Blood pressure 143/77, pulse 96, temperature 97.6 F (36.4 C), temperature source Oral, resp. rate 18, height 5' 10"  (1.778 m), weight 165 lb 6.4 oz (75.025 kg), SpO2 99 %.    HEENT: No thrush or ulcers. Face is rounded. Resp: Lungs clear bilaterally. Cardio: Regular rate and rhythm. GI: Abdomen soft and nontender. No organomegaly. Vascular: No edema. Calves soft and nontender. Neuro: 4 over 5 strength in the right arm and leg.  Port-A-Cath without erythema.   Lab Results:  Lab Results  Component Value Date   WBC 12.3* 11/10/2014   HGB 13.6 11/10/2014   HCT 41.2 11/10/2014   MCV 85.3 11/10/2014   PLT 153 11/10/2014   NEUTROABS 11.0* 11/10/2014    Imaging:  No results found.  Medications: I have reviewed the patient's current medications.  Assessment/Plan: 1. Stage IV adenocarcinoma of the lung, dominant left upper lobe mass and spiculated right upper lobe mass  Status post a biopsy of a left hepatic mass confirming metastatic adenocarcinoma consistent with a lung primary, ALK mutation negative, EGFR amplification suboptimal-repeat testing recommend   Staging CT scans 09/27/2013 confirmed a dominant left upper lung mass, a smaller right upper lung mass, left hepatic mass, and left external iliac node.    Cycle 1 Taxol/carboplatin and Avastin per the CTSU I6270 study 10/09/2013.   Cycle 2 Taxol/carboplatin and Avastin 10/29/2013.   Restaging CT evaluation 11/14/2013 with improvement in the lungs and liver. Left external iliac lymph node was stable.   Cycle 3 Taxol/carboplatin and Avastin 12/02/2013   Restaging CT 01/02/2014 with a mixed response-slightly larger left upper lung mass and mediastinal lymph node, slightly smaller liver mass and iliac node   Maintenance Avastin beginning 01/06/2014   Restaging CT 03/06/2014 with a decrease in the size of the lung masses and a liver lesion  Restaging CT 05/22/2014 with a stable right upper lung nodule, decreased size of a left upper lobe lung mass, increased size of a liver lesion, stable left pelvic nodule/node.  Restaging CT 07/24/2014 with no significant change in the chest, increase in size of the mass within the left hepatic lobe, 2 retroperitoneal lymph nodes increased in size, stable appearance of peritoneal nodules.  brain/ skull metastases documented on an MRI of the brain/cervical spine 09/23/2014, taken off of the E5508 study  Restaging CTs 10/09/2014 -slight decrease in size of the left upper lobe cavitary mass , enlargement of the dominant left liver lesion, no significant change in the size of abdominal or chest lymph nodes except for enlargement of a subcarinal node  Cycle 1 Alimta/Avastin 10/20/2014  Cycle 2 Alimta/Avastin 11/10/2014 2. C. difficile colitis March 2015 3. Hyponatremia/seizure on hospital admission 08/29/2013 4. Chronic right hip "bursitis". 5. Nausea and vomiting following cycle 1 Taxol/carboplatin, the anti-emetic regimen was adjusted with cycle 2. 6. Severe neutropenia following cycle  1 Taxol/carboplatin-Neulasta added with cycle 2. 7. 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 9. Anemia. Likely secondary to chemotherapy. She received red cell  transfusions 12/20/2013 and 12/21/2013. Improved. 10. Hoarseness-improved, potentially related to toxicity from chemotherapy versus tumor. 11. Right retinal detachment 04/05/2014, status post surgical repair 04/07/2014 12. Low TSH with a borderline elevated T4- she was diagnosed with hyperthyroidism, status post radioactive iodine treatment on 08/21/2014  13. History of Proteinuria-mild 14. Decreased right nasolabial fold and right arm/leg weakness 09/22/2014  MRI brain/cervical spine 09/23/2014 revealed 5 brain metastases including a 3.1 cm brainstem metastasis in the left pons, 2 left posterior skull metastases, and a C1 metastasis  Decadron started 09/23/2014 15. Neck pain, pain with swallowing-likely related to thyroid inflammation from I-131 therapy, improved with Decadron   Disposition: [name] appears stable. She has completed 1 cycle of Alimta/Avastin. Plan to proceed with cycle 2 today as scheduled.  She is completing a steroid taper per Dr. Tammi Klippel. She will decrease the dose of dexamethasone to 2 mg daily beginning 11/11/2014.  We discussed that her sleep should get better as the steroid is tapered. We recommended against taking oxycodone for sleep.  She will return for a follow-up visit and cycle 3 Alimta/Avastin in 3 weeks. She will contact the office in the interim with any problems.  Plan reviewed with Dr. Benay Spice.    Ned Card ANP/GNP-BC   11/10/2014  10:19 AM

## 2014-11-10 NOTE — Progress Notes (Signed)
Radiation Oncology         (336) [phone] ________________________________  Name: Christina Hale MRN: [id number]  Date: 11/10/2014  DOB: Feb 07, 1951  Follow-Up Visit Note     CC: Alesia Richards, MD  Ladell Pier, MD  Diagnosis:    64 yo woman with multiple brain metastases from metastatic cancer of the left upper lung s/p SRS 10/06/2014, 10/08/2014, 10/10/2014   1.  Left pontine 30 mm target was treated to 21 Gy in 3 fractions of 7 Gy  2.  4 additional smaller brain metastasis targets were treated to 20 Gy in one fraction    ICD-9-CM ICD-10-CM   1. Brain metastases 198.3 C79.31     Interval Since Last Radiation:  1  month  Narrative:  The patient returns today for routine follow-up.  Steady gait noted with assistance of cane. Patient confirms gait has greatly improved. Reports anxiety for which she take xanax. Denies headache, nausea, or vomiting. Reports blurry vision related to effects of detached retina in right eye. Reports PCP is attempting to manage hyperactive thyroid. Denies ringing in the ears. Reports taking Decadron 2 mg once per day starting tomorrow. No thrush noted.                                ALLERGIES:  is allergic to codeine and prednisolone.  Meds: Current Outpatient Prescriptions  Medication Sig Dispense Refill  . ALPRAZolam (XANAX) 0.25 MG tablet Take 1 tablet (0.25 mg total) by mouth 3 (three) times daily as needed for anxiety. 60 tablet 0  . ALPRAZolam (XANAX) 1 MG tablet Take 1/2 to 1 tablet 3 x day aif needed for anxiety or sleep 90 tablet 5  . amLODipine (NORVASC) 5 MG tablet TAKE 1 TABLET BY MOUTH DAILY ** NEEDED OFFICE VISIT 30 tablet 5  . dexamethasone (DECADRON) 4 MG tablet Take 1 tablet (4 mg total) by mouth 4 (four) times daily. 4 times daily for 1 week, 3 times daily for 1 week, 2 times daily for 1 week, 1/2 pill 2 times daily for 2 weeks, then 1/2 pill once daily for two weeks, then stop 90 tablet 0  . Alum & Mag Hydroxide-Simeth (MAGIC  MOUTHWASH W/LIDOCAINE) SOLN Take 5 mLs by mouth 4 (four) times daily as needed for mouth pain (swish for 1 minute, then spit). (Patient not taking: Reported on 11/10/2014) 240 mL 0  . Cholecalciferol (VITAMIN D) 2000 UNITS CAPS Take 4,000 Units by mouth daily.    . clotrimazole (MYCELEX) 10 MG troche Take 1 tablet (10 mg total) by mouth 3 (three) times daily. Take while on Decadron (Patient not taking: Reported on 11/10/2014) 409 tablet 0  . folic acid (FOLVITE) 1 MG tablet TAKE 1 TABLET (1 MG TOTAL) BY MOUTH DAILY. (Patient not taking: Reported on 11/10/2014) 30 tablet 1  . HYDROcodone-acetaminophen (NORCO) 5-325 MG per tablet Take 1 tablet by mouth every 6 (six) hours as needed for moderate pain or severe pain. (Patient not taking: Reported on 11/10/2014) 20 tablet 0  . lidocaine (XYLOCAINE) 2 % solution Use as directed 20 mLs in the mouth or throat as needed for mouth pain. (Patient not taking: Reported on 11/10/2014) 100 mL 0  . Multiple Vitamin (MULTIVITAMIN WITH MINERALS) TABS tablet Take 1 tablet by mouth daily.    . ondansetron (ZOFRAN) 4 MG tablet Take 1 tablet (4 mg total) by mouth daily as needed for nausea or vomiting. (Patient not taking:  Reported on 11/10/2014) 30 tablet 1  . oxyCODONE (ROXICODONE) 5 MG/5ML solution Take 5 mLs (5 mg total) by mouth every 4 (four) hours as needed for severe pain. (Patient not taking: Reported on 11/10/2014) 240 mL 0  . senna-docusate (SENNA S) 8.6-50 MG per tablet Take 1 tablet by mouth daily. (Patient not taking: Reported on 11/10/2014)    . sucralfate (CARAFATE) 1 GM/10ML suspension Take 10 mLs (1 g total) by mouth 4 (four) times daily -  with meals and at bedtime. (Patient not taking: Reported on 10/13/2014) 420 mL 1  . traMADol (ULTRAM) 50 MG tablet Take 1 tablet (50 mg total) by mouth every 6 (six) hours as needed. (Patient not taking: Reported on 10/13/2014) 40 tablet 0   No current facility-administered medications for this encounter.    Physical Findings: The  patient is in no acute distress. Patient is alert and oriented.  weight is 165 lb 6.4 oz (75.025 kg). Her blood pressure is 140/74 and her pulse is 78. Her respiration is 16. .  No significant changes.  Lab Findings: Lab Results  Component Value Date   WBC 12.3* 11/10/2014   WBC 8.9 10/02/2014   HGB 13.6 11/10/2014   HGB 15.2* 10/02/2014   HCT 41.2 11/10/2014   HCT 44.6 10/02/2014   PLT 153 11/10/2014   PLT 225 10/02/2014    Lab Results  Component Value Date   NA 137 11/10/2014   NA 135 10/02/2014   K 3.8 11/10/2014   K 4.4 10/02/2014   CHLORIDE 97* 11/10/2014   CO2 30* 11/10/2014   CO2 29 10/02/2014   GLUCOSE 97 11/10/2014   GLUCOSE 101* 10/02/2014   BUN 14.4 11/10/2014   BUN 23 10/02/2014   CREATININE 0.7 11/10/2014   CREATININE 0.58 10/02/2014   CREATININE 0.47* 09/18/2014   BILITOT 0.29 11/10/2014   BILITOT 0.5 10/02/2014   ALKPHOS 107 11/10/2014   ALKPHOS 110 10/02/2014   AST 18 11/10/2014   AST 12 10/02/2014   ALT 41 11/10/2014   ALT 18 10/02/2014   PROT 6.4 11/10/2014   PROT 7.5 10/02/2014   ALBUMIN 2.5* 11/10/2014   ALBUMIN 3.4* 10/02/2014   CALCIUM 8.9 11/10/2014   CALCIUM 9.3 10/02/2014   ANIONGAP 10 11/10/2014   ANIONGAP 10 09/18/2014    Impression:  The patient is recovering from the effects of radiation.    Plan:  Baseline MRI scan in 2 months and follow up.   _____________________________________  Sheral Apley. Tammi Klippel, M.D.   This document serves as a record of services personally performed by Tyler Pita, MD. It was created on his behalf by Derek Mound, a trained medical scribe. The creation of this record is based on the scribe's personal observations and the provider's statements to them. This document has been checked and approved by the attending provider.

## 2014-11-10 NOTE — Patient Instructions (Signed)
[facility] Discharge Instructions for Patients Receiving Chemotherapy  Today you received the following chemotherapy agents Avastin and Alimta  To help prevent nausea and vomiting after your treatment, we encourage you to take your nausea medication as prescribed   If you develop nausea and vomiting that is not controlled by your nausea medication, call the clinic.   BELOW ARE SYMPTOMS THAT SHOULD BE REPORTED IMMEDIATELY:  *FEVER GREATER THAN 100.5 F  *CHILLS WITH OR WITHOUT FEVER  NAUSEA AND VOMITING THAT IS NOT CONTROLLED WITH YOUR NAUSEA MEDICATION  *UNUSUAL SHORTNESS OF BREATH  *UNUSUAL BRUISING OR BLEEDING  TENDERNESS IN MOUTH AND THROAT WITH OR WITHOUT PRESENCE OF ULCERS  *URINARY PROBLEMS  *BOWEL PROBLEMS  UNUSUAL RASH Items with * indicate a potential emergency and should be followed up as soon as possible.  Feel free to call the clinic you have any questions or concerns. The clinic phone number is [phone].    

## 2014-11-10 NOTE — Telephone Encounter (Signed)
Gave and printed appt sched and avs for pt for June and July  °

## 2014-11-14 ENCOUNTER — Telehealth: Payer: Self-pay | Admitting: *Deleted

## 2014-11-14 ENCOUNTER — Other Ambulatory Visit: Payer: Self-pay | Admitting: Nurse Practitioner

## 2014-11-14 ENCOUNTER — Encounter: Payer: Self-pay | Admitting: *Deleted

## 2014-11-14 DIAGNOSIS — C3410 Malignant neoplasm of upper lobe, unspecified bronchus or lung: Secondary | ICD-10-CM

## 2014-11-14 LAB — URINE CULTURE

## 2014-11-14 MED ORDER — CIPROFLOXACIN HCL 500 MG PO TABS: 500.0000 mg | ORAL_TABLET | Freq: Two times a day (BID) | ORAL | Status: DC

## 2014-11-14 NOTE — Telephone Encounter (Signed)
Called and informed patient that urine culture confirmed an infection and a prescription for Cipro has been sent to her pharmacy.  Per Elby Showers. Marcello Moores, NP.  Patient verbalized understanding.

## 2014-11-14 NOTE — Telephone Encounter (Signed)
-----   Message from Owens Shark, NP sent at 11/14/2014  3:49 PM EDT ----- Please let her know the urine culture confirmed an infection. Cipro 500 mg twice daily for 7 days sent to her pharmacy.

## 2014-11-14 NOTE — Progress Notes (Signed)
Notified Debbra Riding, [facility], via [facility] email that RECIST measurements are no longer required for this patient. Cindy S. Brigitte Pulse BSN, RN, CCRP 11/14/2014 4:18 PM

## 2014-11-19 ENCOUNTER — Other Ambulatory Visit: Payer: Self-pay | Admitting: Radiation Therapy

## 2014-11-19 DIAGNOSIS — C7931 Secondary malignant neoplasm of brain: Secondary | ICD-10-CM

## 2014-11-30 ENCOUNTER — Other Ambulatory Visit: Payer: Self-pay | Admitting: Oncology

## 2014-12-01 ENCOUNTER — Ambulatory Visit (HOSPITAL_BASED_OUTPATIENT_CLINIC_OR_DEPARTMENT_OTHER): Payer: BC Managed Care – PPO

## 2014-12-01 ENCOUNTER — Other Ambulatory Visit (HOSPITAL_BASED_OUTPATIENT_CLINIC_OR_DEPARTMENT_OTHER): Payer: BC Managed Care – PPO

## 2014-12-01 ENCOUNTER — Ambulatory Visit (HOSPITAL_BASED_OUTPATIENT_CLINIC_OR_DEPARTMENT_OTHER): Payer: BC Managed Care – PPO | Admitting: Oncology

## 2014-12-01 ENCOUNTER — Telehealth: Payer: Self-pay | Admitting: Oncology

## 2014-12-01 VITALS — BP 147/67 | HR 102 | Resp 18 | Ht 70.0 in | Wt 172.3 lb

## 2014-12-01 VITALS — BP 132/60 | HR 90

## 2014-12-01 DIAGNOSIS — C787 Secondary malignant neoplasm of liver and intrahepatic bile duct: Secondary | ICD-10-CM

## 2014-12-01 DIAGNOSIS — C3412 Malignant neoplasm of upper lobe, left bronchus or lung: Secondary | ICD-10-CM

## 2014-12-01 DIAGNOSIS — C7931 Secondary malignant neoplasm of brain: Secondary | ICD-10-CM

## 2014-12-01 DIAGNOSIS — C7951 Secondary malignant neoplasm of bone: Secondary | ICD-10-CM

## 2014-12-01 DIAGNOSIS — Z5112 Encounter for antineoplastic immunotherapy: Secondary | ICD-10-CM | POA: Diagnosis not present

## 2014-12-01 DIAGNOSIS — C3410 Malignant neoplasm of upper lobe, unspecified bronchus or lung: Secondary | ICD-10-CM

## 2014-12-01 LAB — COMPREHENSIVE METABOLIC PANEL (CC13)
ALBUMIN: 2.2 g/dL — AB (ref 3.5–5.0)
ALT: 15 U/L (ref 0–55)
ANION GAP: 8 meq/L (ref 3–11)
AST: 17 U/L (ref 5–34)
Alkaline Phosphatase: 93 U/L (ref 40–150)
BUN: 7.4 mg/dL (ref 7.0–26.0)
CALCIUM: 8.1 mg/dL — AB (ref 8.4–10.4)
CHLORIDE: 99 meq/L (ref 98–109)
CO2: 27 meq/L (ref 22–29)
Creatinine: 0.7 mg/dL (ref 0.6–1.1)
EGFR: 87 mL/min/{1.73_m2} — ABNORMAL LOW (ref >90)
Glucose: 104 mg/dl (ref 70–140)
POTASSIUM: 3.4 meq/L — AB (ref 3.5–5.1)
SODIUM: 133 meq/L — AB (ref 136–145)
TOTAL PROTEIN: 5.9 g/dL — AB (ref 6.4–8.3)
Total Bilirubin: 0.4 mg/dL (ref 0.20–1.20)

## 2014-12-01 LAB — CBC WITH DIFFERENTIAL/PLATELET
BASO%: 0.8 % (ref 0.0–2.0)
Basophils Absolute: 0.1 10*3/uL (ref 0.0–0.1)
EOS%: 2 % (ref 0.0–7.0)
Eosinophils Absolute: 0.1 10*3/uL (ref 0.0–0.5)
HCT: 34.6 % — ABNORMAL LOW (ref 34.8–46.6)
HEMOGLOBIN: 11.5 g/dL — AB (ref 11.6–15.9)
LYMPH%: 10.4 % — ABNORMAL LOW (ref 14.0–49.7)
MCH: 29.7 pg (ref 25.1–34.0)
MCHC: 33.2 g/dL (ref 31.5–36.0)
MCV: 89.4 fL (ref 79.5–101.0)
MONO#: 0.4 10*3/uL (ref 0.1–0.9)
MONO%: 5.9 % (ref 0.0–14.0)
NEUT#: 4.8 10*3/uL (ref 1.5–6.5)
NEUT%: 80.9 % — ABNORMAL HIGH (ref 38.4–76.8)
Platelets: 157 10*3/uL (ref 145–400)
RBC: 3.87 10*6/uL (ref 3.70–5.45)
RDW: 22.9 % — AB (ref 11.2–14.5)
WBC: 6 10*3/uL (ref 3.9–10.3)
lymph#: 0.6 10*3/uL — ABNORMAL LOW (ref 0.9–3.3)

## 2014-12-01 LAB — TECHNOLOGIST REVIEW

## 2014-12-01 LAB — UA PROTEIN, DIPSTICK - CHCC: PROTEIN: NEGATIVE mg/dL

## 2014-12-01 MED ORDER — SODIUM CHLORIDE 0.9 % IJ SOLN
10.0000 mL | INTRAMUSCULAR | Status: DC | PRN
  Administered 2014-12-01: 10 mL
  Filled 2014-12-01: qty 10

## 2014-12-01 MED ORDER — CYANOCOBALAMIN 1000 MCG/ML IJ SOLN
INTRAMUSCULAR | Status: AC
  Filled 2014-12-01: qty 1

## 2014-12-01 MED ORDER — BEVACIZUMAB CHEMO INJECTION 400 MG/16ML
17.5000 mg/kg | Freq: Once | INTRAVENOUS | Status: AC
  Administered 2014-12-01: 1200 mg via INTRAVENOUS
  Filled 2014-12-01: qty 48

## 2014-12-01 MED ORDER — SODIUM CHLORIDE 0.9 % IV SOLN
Freq: Once | INTRAVENOUS | Status: AC
  Administered 2014-12-01: 11:00:00 via INTRAVENOUS
  Filled 2014-12-01: qty 4

## 2014-12-01 MED ORDER — SODIUM CHLORIDE 0.9 % IV SOLN
Freq: Once | INTRAVENOUS | Status: AC
  Administered 2014-12-01: 11:00:00 via INTRAVENOUS

## 2014-12-01 MED ORDER — SODIUM CHLORIDE 0.9 % IV SOLN
540.0000 mg/m2 | Freq: Once | INTRAVENOUS | Status: AC
  Administered 2014-12-01: 1000 mg via INTRAVENOUS
  Filled 2014-12-01: qty 40

## 2014-12-01 MED ORDER — HEPARIN SOD (PORK) LOCK FLUSH 100 UNIT/ML IV SOLN
500.0000 [IU] | Freq: Once | INTRAVENOUS | Status: AC | PRN
  Administered 2014-12-01: 500 [IU]
  Filled 2014-12-01: qty 5

## 2014-12-01 MED ORDER — ALPRAZOLAM 0.25 MG PO TABS: 0.2500 mg | ORAL_TABLET | Freq: Three times a day (TID) | ORAL | Status: DC | PRN

## 2014-12-01 MED ORDER — CYANOCOBALAMIN 1000 MCG/ML IJ SOLN
1000.0000 ug | Freq: Once | INTRAMUSCULAR | Status: AC
  Administered 2014-12-01: 1000 ug via INTRAMUSCULAR

## 2014-12-01 NOTE — Telephone Encounter (Signed)
Gave adn printed appt sched and avs for pt for June thru Aug

## 2014-12-01 NOTE — Patient Instructions (Signed)
[facility] Discharge Instructions for Patients Receiving Chemotherapy  Today you received the following chemotherapy agents Avastin and Alimta  To help prevent nausea and vomiting after your treatment, we encourage you to take your nausea medication as prescribed   If you develop nausea and vomiting that is not controlled by your nausea medication, call the clinic.   BELOW ARE SYMPTOMS THAT SHOULD BE REPORTED IMMEDIATELY:  *FEVER GREATER THAN 100.5 F  *CHILLS WITH OR WITHOUT FEVER  NAUSEA AND VOMITING THAT IS NOT CONTROLLED WITH YOUR NAUSEA MEDICATION  *UNUSUAL SHORTNESS OF BREATH  *UNUSUAL BRUISING OR BLEEDING  TENDERNESS IN MOUTH AND THROAT WITH OR WITHOUT PRESENCE OF ULCERS  *URINARY PROBLEMS  *BOWEL PROBLEMS  UNUSUAL RASH Items with * indicate a potential emergency and should be followed up as soon as possible.  Feel free to call the clinic you have any questions or concerns. The clinic phone number is [phone].    

## 2014-12-01 NOTE — Progress Notes (Signed)
[place] OFFICE PROGRESS NOTE   Diagnosis: Non-small cell lung cancer  INTERVAL HISTORY:   Christina Hale returns as scheduled. She completed another cycle of Alimta/Avastin on 11/10/2014. She tolerated chemotherapy well. Mild nosebleeding. No other bleeding. Stable right arm weakness. She took a trip to the beach last week. She is no longer taking Decadron.  Objective:  Vital signs in last 24 hours:  Blood pressure 147/67, pulse 102, resp. rate 18, height 5' 10"  (1.778 m), weight 172 lb 4.8 oz (78.155 kg), SpO2 100 %.    HEENT: No thrush Resp: Lungs clear bilaterally Cardio: Regular rate and rhythm GI: No hepatomegaly, nontender Vascular: Trace ankle edema bilaterally Neuro: Mild decrease in strength at the right arm and hand    Portacath/PICC-without erythema  Lab Results:  Lab Results  Component Value Date   WBC 6.0 12/01/2014   HGB 11.5* 12/01/2014   HCT 34.6* 12/01/2014   MCV 89.4 12/01/2014   PLT 157 12/01/2014   NEUTROABS 4.8 12/01/2014     Medications: I have reviewed the patient's current medications.  Assessment/Plan: 1. Stage IV adenocarcinoma of the lung, dominant left upper lobe mass and spiculated right upper lobe mass  Status post a biopsy of a left hepatic mass confirming metastatic adenocarcinoma consistent with a lung primary, ALK mutation negative, EGFR amplification suboptimal-repeat testing recommend   Staging CT scans 09/27/2013 confirmed a dominant left upper lung mass, a smaller right upper lung mass, left hepatic mass, and left external iliac node.   Cycle 1 Taxol/carboplatin and Avastin per the CTSU I2979 study 10/09/2013.   Cycle 2 Taxol/carboplatin and Avastin 10/29/2013.   Restaging CT evaluation 11/14/2013 with improvement in the lungs and liver. Left external iliac lymph node was stable.   Cycle 3 Taxol/carboplatin and Avastin 12/02/2013   Restaging CT 01/02/2014 with a mixed response-slightly larger left  upper lung mass and mediastinal lymph node, slightly smaller liver mass and iliac node   Maintenance Avastin beginning 01/06/2014   Restaging CT 03/06/2014 with a decrease in the size of the lung masses and a liver lesion  Restaging CT 05/22/2014 with a stable right upper lung nodule, decreased size of a left upper lobe lung mass, increased size of a liver lesion, stable left pelvic nodule/node.  Restaging CT 07/24/2014 with no significant change in the chest, increase in size of the mass within the left hepatic lobe, 2 retroperitoneal lymph nodes increased in size, stable appearance of peritoneal nodules.  brain/ skull metastases documented on an MRI of the brain/cervical spine 09/23/2014, taken off of the E5508 study  Restaging CTs 10/09/2014 -slight decrease in size of the left upper lobe cavitary mass , enlargement of the dominant left liver lesion, no significant change in the size of abdominal or chest lymph nodes except for enlargement of a subcarinal node  Cycle 1 Alimta/Avastin 10/20/2014  Cycle 2 Alimta/Avastin 11/10/2014  Cycle 3 Alimta/Avastin 12/01/2014 2. C. difficile colitis March 2015 3. Hyponatremia/seizure on hospital admission 08/29/2013 4. Chronic right hip "bursitis". 5. Nausea and vomiting following cycle 1 Taxol/carboplatin, the anti-emetic regimen was adjusted with cycle 2. 6. Severe neutropenia following cycle 1 Taxol/carboplatin-Neulasta added with cycle 2. 7. 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 9. Anemia. Likely secondary to chemotherapy. She received red cell transfusions 12/20/2013 and 12/21/2013. Improved. 10. Hoarseness-improved, potentially related to toxicity from chemotherapy versus tumor. 11. Right retinal detachment 04/05/2014, status post surgical repair 04/07/2014 12. Low TSH with a borderline elevated T4- she was  diagnosed with hyperthyroidism, status post radioactive iodine treatment on  08/21/2014  13. History of Proteinuria-mild 14. Decreased right nasolabial fold and right arm/leg weakness 09/22/2014  MRI brain/cervical spine 09/23/2014 revealed 5 brain metastases including a 3.1 cm brainstem metastasis in the left pons, 2 left posterior skull metastases, and a C1 metastasis  Decadron started 09/23/2014 a tapered to off 15. Neck pain, pain with swallowing-likely related to thyroid inflammation from I-131 therapy, improved with Decadron    Disposition:  Christina Hale appears stable. The plan is to proceed with cycle 3 Alimta/Avastin today. She will return for an office visit and chemotherapy in 3 weeks. She will be scheduled for a restaging CT evaluation after 4-5 cycles of the current therapy.  Christina Coder, MD  12/01/2014  5:16 PM

## 2014-12-02 NOTE — Addendum Note (Signed)
Addended by: Adalberto Cole on: 12/02/2014 08:49 AM   Modules accepted: Orders, Medications

## 2014-12-10 ENCOUNTER — Other Ambulatory Visit: Payer: Self-pay | Admitting: Internal Medicine

## 2014-12-20 ENCOUNTER — Other Ambulatory Visit: Payer: Self-pay | Admitting: Oncology

## 2014-12-22 ENCOUNTER — Ambulatory Visit (HOSPITAL_BASED_OUTPATIENT_CLINIC_OR_DEPARTMENT_OTHER): Payer: BC Managed Care – PPO | Admitting: Oncology

## 2014-12-22 ENCOUNTER — Telehealth: Payer: Self-pay | Admitting: Oncology

## 2014-12-22 ENCOUNTER — Other Ambulatory Visit (HOSPITAL_BASED_OUTPATIENT_CLINIC_OR_DEPARTMENT_OTHER): Payer: BC Managed Care – PPO

## 2014-12-22 ENCOUNTER — Ambulatory Visit (HOSPITAL_BASED_OUTPATIENT_CLINIC_OR_DEPARTMENT_OTHER): Payer: BC Managed Care – PPO

## 2014-12-22 VITALS — BP 149/82 | HR 105 | Temp 97.9°F | Resp 18 | Ht 70.0 in | Wt 170.1 lb

## 2014-12-22 DIAGNOSIS — C3410 Malignant neoplasm of upper lobe, unspecified bronchus or lung: Secondary | ICD-10-CM

## 2014-12-22 DIAGNOSIS — Z5111 Encounter for antineoplastic chemotherapy: Secondary | ICD-10-CM

## 2014-12-22 DIAGNOSIS — C7951 Secondary malignant neoplasm of bone: Secondary | ICD-10-CM | POA: Diagnosis not present

## 2014-12-22 DIAGNOSIS — C3412 Malignant neoplasm of upper lobe, left bronchus or lung: Secondary | ICD-10-CM

## 2014-12-22 DIAGNOSIS — Z5112 Encounter for antineoplastic immunotherapy: Secondary | ICD-10-CM | POA: Diagnosis not present

## 2014-12-22 DIAGNOSIS — C7931 Secondary malignant neoplasm of brain: Secondary | ICD-10-CM

## 2014-12-22 DIAGNOSIS — C787 Secondary malignant neoplasm of liver and intrahepatic bile duct: Secondary | ICD-10-CM

## 2014-12-22 DIAGNOSIS — L989 Disorder of the skin and subcutaneous tissue, unspecified: Secondary | ICD-10-CM

## 2014-12-22 LAB — CBC WITH DIFFERENTIAL/PLATELET
BASO%: 1.4 % (ref 0.0–2.0)
Basophils Absolute: 0.1 10*3/uL (ref 0.0–0.1)
EOS ABS: 0.1 10*3/uL (ref 0.0–0.5)
EOS%: 1.3 % (ref 0.0–7.0)
HCT: 37 % (ref 34.8–46.6)
HEMOGLOBIN: 12.2 g/dL (ref 11.6–15.9)
LYMPH%: 9.2 % — AB (ref 14.0–49.7)
MCH: 31.3 pg (ref 25.1–34.0)
MCHC: 33 g/dL (ref 31.5–36.0)
MCV: 94.7 fL (ref 79.5–101.0)
MONO#: 0.6 10*3/uL (ref 0.1–0.9)
MONO%: 7 % (ref 0.0–14.0)
NEUT%: 81.1 % — ABNORMAL HIGH (ref 38.4–76.8)
NEUTROS ABS: 6.9 10*3/uL — AB (ref 1.5–6.5)
PLATELETS: 275 10*3/uL (ref 145–400)
RBC: 3.9 10*6/uL (ref 3.70–5.45)
RDW: 25.6 % — AB (ref 11.2–14.5)
WBC: 8.5 10*3/uL (ref 3.9–10.3)
lymph#: 0.8 10*3/uL — ABNORMAL LOW (ref 0.9–3.3)

## 2014-12-22 LAB — COMPREHENSIVE METABOLIC PANEL (CC13)
ALT: 11 U/L (ref 0–55)
AST: 15 U/L (ref 5–34)
Albumin: 2.7 g/dL — ABNORMAL LOW (ref 3.5–5.0)
Alkaline Phosphatase: 107 U/L (ref 40–150)
Anion Gap: 8 mEq/L (ref 3–11)
BUN: 6.5 mg/dL — AB (ref 7.0–26.0)
CO2: 23 meq/L (ref 22–29)
Calcium: 9.3 mg/dL (ref 8.4–10.4)
Chloride: 104 mEq/L (ref 98–109)
Creatinine: 0.8 mg/dL (ref 0.6–1.1)
EGFR: 78 mL/min/{1.73_m2} — ABNORMAL LOW (ref >90)
GLUCOSE: 92 mg/dL (ref 70–140)
Potassium: 3.9 mEq/L (ref 3.5–5.1)
SODIUM: 135 meq/L — AB (ref 136–145)
Total Bilirubin: 0.39 mg/dL (ref 0.20–1.20)
Total Protein: 6.9 g/dL (ref 6.4–8.3)

## 2014-12-22 LAB — UA PROTEIN, DIPSTICK - CHCC: PROTEIN: NEGATIVE mg/dL

## 2014-12-22 MED ORDER — SODIUM CHLORIDE 0.9 % IJ SOLN
10.0000 mL | INTRAMUSCULAR | Status: DC | PRN
  Administered 2014-12-22: 10 mL
  Filled 2014-12-22: qty 10

## 2014-12-22 MED ORDER — SODIUM CHLORIDE 0.9 % IV SOLN
1000.0000 mg | Freq: Once | INTRAVENOUS | Status: AC
  Administered 2014-12-22: 1000 mg via INTRAVENOUS
  Filled 2014-12-22: qty 40

## 2014-12-22 MED ORDER — HEPARIN SOD (PORK) LOCK FLUSH 100 UNIT/ML IV SOLN
500.0000 [IU] | Freq: Once | INTRAVENOUS | Status: AC | PRN
  Administered 2014-12-22: 500 [IU]
  Filled 2014-12-22: qty 5

## 2014-12-22 MED ORDER — SODIUM CHLORIDE 0.9 % IV SOLN
Freq: Once | INTRAVENOUS | Status: AC
  Administered 2014-12-22: 11:00:00 via INTRAVENOUS

## 2014-12-22 MED ORDER — DEXAMETHASONE SODIUM PHOSPHATE 100 MG/10ML IJ SOLN
Freq: Once | INTRAMUSCULAR | Status: AC
  Administered 2014-12-22: 12:00:00 via INTRAVENOUS
  Filled 2014-12-22: qty 4

## 2014-12-22 MED ORDER — SODIUM CHLORIDE 0.9 % IV SOLN
1200.0000 mg | Freq: Once | INTRAVENOUS | Status: AC
  Administered 2014-12-22: 1200 mg via INTRAVENOUS
  Filled 2014-12-22: qty 48

## 2014-12-22 NOTE — Telephone Encounter (Signed)
Gave patient avs report and appointments for August.  °

## 2014-12-22 NOTE — Patient Instructions (Signed)
[facility] Discharge Instructions for Patients Receiving Chemotherapy  Today you received the following chemotherapy agents alimta  To help prevent nausea and vomiting after your treatment, we encourage you to take your nausea medication as directed  If you develop nausea and vomiting that is not controlled by your nausea medication, call the clinic.   BELOW ARE SYMPTOMS THAT SHOULD BE REPORTED IMMEDIATELY:  *FEVER GREATER THAN 100.5 F  *CHILLS WITH OR WITHOUT FEVER  NAUSEA AND VOMITING THAT IS NOT CONTROLLED WITH YOUR NAUSEA MEDICATION  *UNUSUAL SHORTNESS OF BREATH  *UNUSUAL BRUISING OR BLEEDING  TENDERNESS IN MOUTH AND THROAT WITH OR WITHOUT PRESENCE OF ULCERS  *URINARY PROBLEMS  *BOWEL PROBLEMS  UNUSUAL RASH Items with * indicate a potential emergency and should be followed up as soon as possible.  Feel free to call the clinic you have any questions or concerns. The clinic phone number is [phone].  

## 2014-12-22 NOTE — Progress Notes (Signed)
[place] OFFICE PROGRESS NOTE   Diagnosis: Non-small cell lung cancer  INTERVAL HISTORY:   Christina Hale returns as scheduled. She completed another cycle of Alimta/Avastin on 12/01/2014. She has an improved energy level. The right-sided weakness is partially improved. No bleeding or symptom of thrombosis. She has developed skin "lesions "at the left leg. No known in the bite. She was restarted on thyroid hormone replacement by Dr. Forde Dandy.  Objective:  Vital signs in last 24 hours:  Blood pressure 149/82, pulse 105, temperature 97.9 F (36.6 C), temperature source Oral, resp. rate 18, height _0  (1.778 m), weight 170 lb 1.6 oz (77.157 kg), SpO2 99 %.    HEENT: Neck without mass, no thrush or ulcers Resp: Lungs clear bilaterally Cardio: Regular rate and rhythm GI: No hepatomegaly Vascular: No leg edema Neuro: 4+/5 strength with dorsi flexion at the right foot and right hand grip  Skin: Circular superficially ulcerated lesions at the left thigh and left lower leg that appeared to be healing   Portacath/PICC-without erythema  Lab Results:  Lab Results  Component Value Date   WBC 8.5 12/22/2014   HGB 12.2 12/22/2014   HCT 37.0 12/22/2014   MCV 94.7 12/22/2014   PLT 275 12/22/2014   NEUTROABS 6.9* 12/22/2014     Medications: I have reviewed the patient's current medications.  Assessment/Plan: 1. Stage IV adenocarcinoma of the lung, dominant left upper lobe mass and spiculated right upper lobe mass  Status post a biopsy of a left hepatic mass confirming metastatic adenocarcinoma consistent with a lung primary, ALK mutation negative, EGFR amplification suboptimal-repeat testing recommend   Staging CT scans 09/27/2013 confirmed a dominant left upper lung mass, a smaller right upper lung mass, left hepatic mass, and left external iliac node.   Cycle 1 Taxol/carboplatin and Avastin per the CTSU Q5956 study 10/09/2013.   Cycle 2 Taxol/carboplatin and  Avastin 10/29/2013.   Restaging CT evaluation 11/14/2013 with improvement in the lungs and liver. Left external iliac lymph node was stable.   Cycle 3 Taxol/carboplatin and Avastin 12/02/2013   Restaging CT 01/02/2014 with a mixed response-slightly larger left upper lung mass and mediastinal lymph node, slightly smaller liver mass and iliac node   Maintenance Avastin beginning 01/06/2014   Restaging CT 03/06/2014 with a decrease in the size of the lung masses and a liver lesion  Restaging CT 05/22/2014 with a stable right upper lung nodule, decreased size of a left upper lobe lung mass, increased size of a liver lesion, stable left pelvic nodule/node.  Restaging CT 07/24/2014 with no significant change in the chest, increase in size of the mass within the left hepatic lobe, 2 retroperitoneal lymph nodes increased in size, stable appearance of peritoneal nodules.  brain/ skull metastases documented on an MRI of the brain/cervical spine 09/23/2014, taken off of the E5508 study  Restaging CTs 10/09/2014 -slight decrease in size of the left upper lobe cavitary mass , enlargement of the dominant left liver lesion, no significant change in the size of abdominal or chest lymph nodes except for enlargement of a subcarinal node  Cycle 1 Alimta/Avastin 10/20/2014  Cycle 2 Alimta/Avastin 11/10/2014  Cycle 3 Alimta/Avastin 12/01/2014  Cycle 4 Alimta/Avastin 12/22/2014 2. C. difficile colitis March 2015 3. Hyponatremia/seizure on hospital admission 08/29/2013 4. Chronic right hip "bursitis". 5. Nausea and vomiting following cycle 1 Taxol/carboplatin, the anti-emetic regimen was adjusted with cycle 2. 6. Severe neutropenia following cycle 1 Taxol/carboplatin-Neulasta added with cycle 2. 7. 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 9. Anemia. Likely secondary to chemotherapy. She received red cell transfusions 12/20/2013 and 12/21/2013.  Improved. 10. Hoarseness-improved, potentially related to toxicity from chemotherapy versus tumor. 11. Right retinal detachment 04/05/2014, status post surgical repair 04/07/2014 12. Low TSH with a borderline elevated T4- she was diagnosed with hyperthyroidism, status post radioactive iodine treatment on 08/21/2014 , started on thyroid hormone replacement by Dr. Forde Dandy 13. History of Proteinuria-mild 14. Decreased right nasolabial fold and right arm/leg weakness 09/22/2014  MRI brain/cervical spine 09/23/2014 revealed 5 brain metastases including a 3.1 cm brainstem metastasis in the left pons, 2 left posterior skull metastases, and a C1 metastasis  Decadron started 09/23/2014 a tapered to off 15. Neck pain, pain with swallowing-likely related to thyroid inflammation from I-131 therapy, improved with Decadron   Disposition:  Her performance status appears improved. The plan is to continue Alimta/Avastin. The etiology of the left leg lesions is unclear. I have a low clinical suspicion for an infection. The lesions appeared be healing. Christina Hale will return for an office visit and chemotherapy in 3 weeks. The plan is to obtain a restaging CT of the chest and liver after 6 cycles of Alimta/Avastin.  Betsy Coder, MD  12/22/2014  10:25 AM

## 2014-12-29 ENCOUNTER — Telehealth: Payer: Self-pay | Admitting: *Deleted

## 2014-12-29 NOTE — Telephone Encounter (Signed)
Spouse called reporting "Christina Hale has a sore mouth.  Tongue and the back of her mouth hurt.  What should we do to relieve this."  Observed MMW on medication list.  "Has not yet used MMW.  Tongue is not coated white but cannot see throat to tell."  This nurse advised using Biotene mouth products.  Rinse after each meal.  Try baking soda salt gargles as well.  MMW directions reviewed.  Foods that are fried, greasy, spicy, salty and too hot may irritate her mouth.  Asked that she call tomorrow with update after trying these tips.

## 2014-12-31 NOTE — Telephone Encounter (Signed)
No return call noted.  This nurse called Christina Hale.  " This started over the weekend.  I've made some changes that have helped.  I stopped eating salty foods, no sodas or crunchy foods.  I tried Biotene but it burns.  Sucking on popsickles didn't work either.  I eat soft foods, Boost, coffee, bananas.  I do not see or feel any sores in my mouth.."   This nurse advised her to rinse mouth after meals and continue MMW.

## 2015-01-03 ENCOUNTER — Other Ambulatory Visit: Payer: Self-pay | Admitting: Internal Medicine

## 2015-01-04 ENCOUNTER — Other Ambulatory Visit: Payer: Self-pay | Admitting: Oncology

## 2015-01-04 DIAGNOSIS — C3490 Malignant neoplasm of unspecified part of unspecified bronchus or lung: Secondary | ICD-10-CM

## 2015-01-07 ENCOUNTER — Telehealth: Payer: Self-pay | Admitting: *Deleted

## 2015-01-07 NOTE — Telephone Encounter (Addendum)
Called spouse after message retrieved complaint of "Swelling to ankles and feet.  When I push then the indentation of my fingers remains there.  I'm concerned about swelling.  Her fluid pill was stopped when she started chemotherapy.  I think she may need the fluid pill.  the right ankle is a little larger than the left."  Her mouth is better and she is eating and drinking.  Denies signs of respiratory distress.  Advised to avoid salt, increase legs above heart with extra pillows. Will route to provider.

## 2015-01-07 NOTE — Telephone Encounter (Signed)
Verbal order received and read back from Dr. Benay Spice for her to wear support stockings or socks, elevate legs and drink water.  Will see patient 01-12-2015, assess further and can prescribe something as needed if still a problem.  Called Mr. Pirozzi with this information.  Verbalized understanding.

## 2015-01-08 ENCOUNTER — Ambulatory Visit
Admission: RE | Admit: 2015-01-08 | Discharge: 2015-01-08 | Disposition: A | Discharge status: Home / Self Care | Payer: BC Managed Care – PPO | Source: Ambulatory Visit | Attending: Radiation Oncology | Admitting: Radiation Oncology

## 2015-01-08 DIAGNOSIS — C7931 Secondary malignant neoplasm of brain: Secondary | ICD-10-CM

## 2015-01-08 MED ORDER — GADOBENATE DIMEGLUMINE 529 MG/ML IV SOLN
16.0000 mL | Freq: Once | INTRAVENOUS | Status: AC | PRN
  Administered 2015-01-08: 16 mL via INTRAVENOUS

## 2015-01-09 ENCOUNTER — Other Ambulatory Visit: Payer: BC Managed Care – PPO

## 2015-01-10 ENCOUNTER — Other Ambulatory Visit: Payer: Self-pay | Admitting: Oncology

## 2015-01-12 ENCOUNTER — Ambulatory Visit (HOSPITAL_BASED_OUTPATIENT_CLINIC_OR_DEPARTMENT_OTHER): Payer: BC Managed Care – PPO

## 2015-01-12 ENCOUNTER — Ambulatory Visit
Admission: RE | Admit: 2015-01-12 | Discharge: 2015-01-12 | Disposition: A | Discharge status: Home / Self Care | Payer: BC Managed Care – PPO | Source: Ambulatory Visit | Attending: Radiation Oncology | Admitting: Radiation Oncology

## 2015-01-12 ENCOUNTER — Encounter: Payer: Self-pay | Admitting: Radiation Oncology

## 2015-01-12 ENCOUNTER — Ambulatory Visit (HOSPITAL_BASED_OUTPATIENT_CLINIC_OR_DEPARTMENT_OTHER): Payer: BC Managed Care – PPO | Admitting: Nurse Practitioner

## 2015-01-12 ENCOUNTER — Other Ambulatory Visit (HOSPITAL_BASED_OUTPATIENT_CLINIC_OR_DEPARTMENT_OTHER): Payer: BC Managed Care – PPO

## 2015-01-12 ENCOUNTER — Encounter: Payer: Self-pay | Admitting: Nurse Practitioner

## 2015-01-12 ENCOUNTER — Telehealth: Payer: Self-pay | Admitting: Oncology

## 2015-01-12 VITALS — BP 146/82 | HR 100 | Temp 97.7°F | Ht 70.0 in | Wt 174.4 lb

## 2015-01-12 VITALS — BP 146/82 | HR 77 | Temp 97.7°F | Resp 16 | Wt 174.6 lb

## 2015-01-12 VITALS — BP 123/63 | HR 100

## 2015-01-12 DIAGNOSIS — C3412 Malignant neoplasm of upper lobe, left bronchus or lung: Secondary | ICD-10-CM

## 2015-01-12 DIAGNOSIS — C7931 Secondary malignant neoplasm of brain: Secondary | ICD-10-CM

## 2015-01-12 DIAGNOSIS — C7951 Secondary malignant neoplasm of bone: Secondary | ICD-10-CM

## 2015-01-12 DIAGNOSIS — Z5112 Encounter for antineoplastic immunotherapy: Secondary | ICD-10-CM

## 2015-01-12 DIAGNOSIS — Z5111 Encounter for antineoplastic chemotherapy: Secondary | ICD-10-CM

## 2015-01-12 DIAGNOSIS — R600 Localized edema: Secondary | ICD-10-CM

## 2015-01-12 DIAGNOSIS — C787 Secondary malignant neoplasm of liver and intrahepatic bile duct: Secondary | ICD-10-CM

## 2015-01-12 DIAGNOSIS — C3410 Malignant neoplasm of upper lobe, unspecified bronchus or lung: Secondary | ICD-10-CM

## 2015-01-12 DIAGNOSIS — M549 Dorsalgia, unspecified: Secondary | ICD-10-CM

## 2015-01-12 DIAGNOSIS — R05 Cough: Secondary | ICD-10-CM

## 2015-01-12 DIAGNOSIS — R067 Sneezing: Secondary | ICD-10-CM

## 2015-01-12 LAB — CBC WITH DIFFERENTIAL/PLATELET
BASO%: 0.3 % (ref 0.0–2.0)
BASOS ABS: 0 10*3/uL (ref 0.0–0.1)
EOS%: 2.8 % (ref 0.0–7.0)
Eosinophils Absolute: 0.2 10*3/uL (ref 0.0–0.5)
HCT: 33.6 % — ABNORMAL LOW (ref 34.8–46.6)
HGB: 10.8 g/dL — ABNORMAL LOW (ref 11.6–15.9)
LYMPH#: 0.8 10*3/uL — AB (ref 0.9–3.3)
LYMPH%: 13.2 % — ABNORMAL LOW (ref 14.0–49.7)
MCH: 33 pg (ref 25.1–34.0)
MCHC: 32.1 g/dL (ref 31.5–36.0)
MCV: 102.8 fL — ABNORMAL HIGH (ref 79.5–101.0)
MONO#: 0.8 10*3/uL (ref 0.1–0.9)
MONO%: 12.2 % (ref 0.0–14.0)
NEUT#: 4.6 10*3/uL (ref 1.5–6.5)
NEUT%: 71.5 % (ref 38.4–76.8)
Platelets: 212 10*3/uL (ref 145–400)
RBC: 3.27 10*6/uL — ABNORMAL LOW (ref 3.70–5.45)
RDW: 19.9 % — AB (ref 11.2–14.5)
WBC: 6.4 10*3/uL (ref 3.9–10.3)

## 2015-01-12 LAB — UA PROTEIN, DIPSTICK - CHCC: Protein, ur: NEGATIVE mg/dL

## 2015-01-12 LAB — COMPREHENSIVE METABOLIC PANEL (CC13)
ALT: 6 U/L (ref 0–55)
AST: 15 U/L (ref 5–34)
Albumin: 2.7 g/dL — ABNORMAL LOW (ref 3.5–5.0)
Alkaline Phosphatase: 111 U/L (ref 40–150)
Anion Gap: 7 mEq/L (ref 3–11)
BUN: 6.9 mg/dL — AB (ref 7.0–26.0)
CHLORIDE: 107 meq/L (ref 98–109)
CO2: 25 meq/L (ref 22–29)
Calcium: 9.1 mg/dL (ref 8.4–10.4)
Creatinine: 0.7 mg/dL (ref 0.6–1.1)
EGFR: 87 mL/min/{1.73_m2} — ABNORMAL LOW (ref >90)
Glucose: 91 mg/dl (ref 70–140)
POTASSIUM: 3.9 meq/L (ref 3.5–5.1)
SODIUM: 139 meq/L (ref 136–145)
Total Bilirubin: 0.29 mg/dL (ref 0.20–1.20)
Total Protein: 6.9 g/dL (ref 6.4–8.3)

## 2015-01-12 MED ORDER — SODIUM CHLORIDE 0.9 % IV SOLN
550.0000 mg/m2 | Freq: Once | INTRAVENOUS | Status: AC
  Administered 2015-01-12: 1000 mg via INTRAVENOUS
  Filled 2015-01-12: qty 40

## 2015-01-12 MED ORDER — SODIUM CHLORIDE 0.9 % IV SOLN
Freq: Once | INTRAVENOUS | Status: AC
  Administered 2015-01-12: 12:00:00 via INTRAVENOUS
  Filled 2015-01-12: qty 4

## 2015-01-12 MED ORDER — SODIUM CHLORIDE 0.9 % IJ SOLN
10.0000 mL | INTRAMUSCULAR | Status: DC | PRN
  Administered 2015-01-12: 10 mL
  Filled 2015-01-12: qty 10

## 2015-01-12 MED ORDER — SODIUM CHLORIDE 0.9 % IV SOLN
1200.0000 mg | Freq: Once | INTRAVENOUS | Status: AC
  Administered 2015-01-12: 1200 mg via INTRAVENOUS
  Filled 2015-01-12: qty 48

## 2015-01-12 MED ORDER — HEPARIN SOD (PORK) LOCK FLUSH 100 UNIT/ML IV SOLN
500.0000 [IU] | Freq: Once | INTRAVENOUS | Status: AC | PRN
  Administered 2015-01-12: 500 [IU]
  Filled 2015-01-12: qty 5

## 2015-01-12 MED ORDER — SODIUM CHLORIDE 0.9 % IV SOLN
Freq: Once | INTRAVENOUS | Status: AC
  Administered 2015-01-12: 12:00:00 via INTRAVENOUS

## 2015-01-12 NOTE — Telephone Encounter (Signed)
Gave adn printed appt sched and avs fo rpt for Aug and sept

## 2015-01-12 NOTE — Progress Notes (Signed)
Weight and vitals stable. Denies pain. Slow shuffling gait noted with assistance of walker. Denies headaches. Denies taking decadron. Denies seizure activity. Denies diplopia or ringing in the ears. Denies nausea or vomiting. Scheduled for Alimta infusion today. Husband reports her personality is returning to normal. Responding appropriately and quickly to questions. Patient reports her only complaint is that she feels cold all the time.  BP 146/82 mmHg  Pulse 77  Temp(Src) 97.7 F (36.5 C) (Oral)  Resp 16  Wt 174 lb 9.6 oz (79.198 kg)  SpO2 100% Wt Readings from Last 3 Encounters:  01/12/15 174 lb 9.6 oz (79.198 kg)  01/12/15 174 lb 6.4 oz (79.107 kg)  01/08/15 174 lb (78.926 kg)

## 2015-01-12 NOTE — Progress Notes (Signed)
Radiation Oncology         [phone] ________________________________  Name: Christina Hale MRN: [id number]  Date: 01/12/2015  DOB: March 19, 1951  Follow-Up Visit Note     CC: Alesia Richards, MD  Ladell Pier, MD  Diagnosis:    64 yo woman with multiple brain metastases from metastatic cancer of the left upper lung s/p SRS 10/06/2014, 10/08/2014, 10/10/2014   1.  Left pontine 30 mm target was treated to 21 Gy in 3 fractions of 7 Gy  2.  4 additional smaller brain metastasis targets were treated to 20 Gy in one fraction  No diagnosis found.  Interval Since Last Radiation:  3  month  Narrative: The patient returns today for routine follow-up.  Weight and vitals stable. Denies pain. Slow shuffling gait noted with assistance of walker. Denies headaches. Denies taking decadron. Denies seizure activity. Denies diplopia or ringing in the ears. Denies nausea or vomiting. Scheduled for Alimta infusion today. Husband reports her personality is returning to normal. Responding appropriately and quickly to questions. Patient reports her only complaint is that she feels cold all the time.                              ALLERGIES:  is allergic to codeine and prednisolone.  Meds: Current Outpatient Prescriptions  Medication Sig Dispense Refill  . ALPRAZolam (XANAX) 0.25 MG tablet Take 1 tablet (0.25 mg total) by mouth 3 (three) times daily as needed for anxiety. 60 tablet 0  . ALPRAZolam (XANAX) 1 MG tablet Take 1/2 to 1 tablet 3 x day aif needed for anxiety or sleep 90 tablet 5  . amLODipine (NORVASC) 5 MG tablet TAKE 1 TABLET BY MOUTH DAILY ** NEEDED OFFICE VISIT 30 tablet 5  . dexamethasone (DECADRON) 4 MG tablet Take 1 tablet (4 mg total) by mouth 4 (four) times daily. 4 times daily for 1 week, 3 times daily for 1 week, 2 times daily for 1 week, 1/2 pill 2 times daily for 2 weeks, then 1/2 pill once daily for two weeks, then stop 90 tablet 0  . Alum & Mag Hydroxide-Simeth (MAGIC MOUTHWASH  W/LIDOCAINE) SOLN Take 5 mLs by mouth 4 (four) times daily as needed for mouth pain (swish for 1 minute, then spit). (Patient not taking: Reported on 11/10/2014) 240 mL 0  . Cholecalciferol (VITAMIN D) 2000 UNITS CAPS Take 4,000 Units by mouth daily.    . clotrimazole (MYCELEX) 10 MG troche Take 1 tablet (10 mg total) by mouth 3 (three) times daily. Take while on Decadron (Patient not taking: Reported on 11/10/2014) 824 tablet 0  . folic acid (FOLVITE) 1 MG tablet TAKE 1 TABLET (1 MG TOTAL) BY MOUTH DAILY. (Patient not taking: Reported on 11/10/2014) 30 tablet 1  . HYDROcodone-acetaminophen (NORCO) 5-325 MG per tablet Take 1 tablet by mouth every 6 (six) hours as needed for moderate pain or severe pain. (Patient not taking: Reported on 11/10/2014) 20 tablet 0  . lidocaine (XYLOCAINE) 2 % solution Use as directed 20 mLs in the mouth or throat as needed for mouth pain. (Patient not taking: Reported on 11/10/2014) 100 mL 0  . Multiple Vitamin (MULTIVITAMIN WITH MINERALS) TABS tablet Take 1 tablet by mouth daily.    . ondansetron (ZOFRAN) 4 MG tablet Take 1 tablet (4 mg total) by mouth daily as needed for nausea or vomiting. (Patient not taking: Reported on 11/10/2014) 30 tablet 1  . oxyCODONE (ROXICODONE) 5 MG/5ML  solution Take 5 mLs (5 mg total) by mouth every 4 (four) hours as needed for severe pain. (Patient not taking: Reported on 11/10/2014) 240 mL 0  . senna-docusate (SENNA S) 8.6-50 MG per tablet Take 1 tablet by mouth daily. (Patient not taking: Reported on 11/10/2014)    . sucralfate (CARAFATE) 1 GM/10ML suspension Take 10 mLs (1 g total) by mouth 4 (four) times daily -  with meals and at bedtime. (Patient not taking: Reported on 10/13/2014) 420 mL 1  . traMADol (ULTRAM) 50 MG tablet Take 1 tablet (50 mg total) by mouth every 6 (six) hours as needed. (Patient not taking: Reported on 10/13/2014) 40 tablet 0   No current facility-administered medications for this encounter.    Physical Findings: The patient is in  no acute distress. Patient is alert and oriented.  weight is 174 lb 9.6 oz (79.198 kg). Her oral temperature is 97.7 F (36.5 C). Her blood pressure is 146/82 and her pulse is 77. Her respiration is 16 and oxygen saturation is 100%. .  No significant changes.  Lab Findings: Lab Results  Component Value Date   WBC 6.4 01/12/2015   WBC 8.9 10/02/2014   HGB 10.8* 01/12/2015   HGB 15.2* 10/02/2014   HCT 33.6* 01/12/2015   HCT 44.6 10/02/2014   PLT 212 01/12/2015   PLT 225 10/02/2014    Lab Results  Component Value Date   NA 139 01/12/2015   NA 135 10/02/2014   K 3.9 01/12/2015   K 4.4 10/02/2014   CHLORIDE 107 01/12/2015   CO2 25 01/12/2015   CO2 29 10/02/2014   GLUCOSE 91 01/12/2015   GLUCOSE 101* 10/02/2014   BUN 6.9* 01/12/2015   BUN 23 10/02/2014   CREATININE 0.7 01/12/2015   CREATININE 0.58 10/02/2014   CREATININE 0.47* 09/18/2014   BILITOT 0.29 01/12/2015   BILITOT 0.5 10/02/2014   ALKPHOS 111 01/12/2015   ALKPHOS 110 10/02/2014   AST 15 01/12/2015   AST 12 10/02/2014   ALT 6 01/12/2015   ALT 18 10/02/2014   PROT 6.9 01/12/2015   PROT 7.5 10/02/2014   ALBUMIN 2.7* 01/12/2015   ALBUMIN 3.4* 10/02/2014   CALCIUM 9.1 01/12/2015   CALCIUM 9.3 10/02/2014   ANIONGAP 7 01/12/2015   ANIONGAP 10 09/18/2014    Impression:  The patient is recovering from the effects of radiation. Brain metastases appear to be well controlled.    Plan:  MRI scan in 3 months and follow up. Patient is considering physical therapy to strengthen her leg.   _____________________________________  Sheral Apley. Tammi Klippel, M.D.   This document serves as a record of services personally performed by Tyler Pita, MD. It was created on his behalf by Derek Mound, a trained medical scribe. The creation of this record is based on the scribe's personal observations and the provider's statements to them. This document has been checked and approved by the attending provider.

## 2015-01-12 NOTE — Patient Instructions (Addendum)
[facility] Discharge Instructions for Patients Receiving Chemotherapy  Today you received the following chemotherapy agents: Avastin and Alimta.   To help prevent nausea and vomiting after your treatment, we encourage you to take your nausea medication as directed.    If you develop nausea and vomiting that is not controlled by your nausea medication, call the clinic.   BELOW ARE SYMPTOMS THAT SHOULD BE REPORTED IMMEDIATELY:  *FEVER GREATER THAN 100.5 F  *CHILLS WITH OR WITHOUT FEVER  NAUSEA AND VOMITING THAT IS NOT CONTROLLED WITH YOUR NAUSEA MEDICATION  *UNUSUAL SHORTNESS OF BREATH  *UNUSUAL BRUISING OR BLEEDING  TENDERNESS IN MOUTH AND THROAT WITH OR WITHOUT PRESENCE OF ULCERS  *URINARY PROBLEMS  *BOWEL PROBLEMS  UNUSUAL RASH Items with * indicate a potential emergency and should be followed up as soon as possible.  Feel free to call the clinic you have any questions or concerns. The clinic phone number is [phone].  Please show the [facility] at check-in to the Emergency Department and triage nurse.  Pemetrexed injection What is this medicine? PEMETREXED (PEM e TREX ed) is a chemotherapy drug. This medicine affects cells that are rapidly growing, such as cancer cells and cells in your mouth and stomach. It is usually used to treat lung cancers like non-small cell lung cancer and mesothelioma. It may also be used to treat other cancers. This medicine may be used for other purposes; ask your health care provider or pharmacist if you have questions. COMMON BRAND NAME(S): Alimta What should I tell my health care provider before I take this medicine? They need to know if you have any of these conditions: -if you frequently drink alcohol containing beverages -infection (especially a virus infection such as chickenpox, cold sores, or herpes) -kidney disease -liver disease -low blood counts, like low platelets, red bloods, or white blood cells -an  unusual or allergic reaction to pemetrexed, mannitol, other medicines, foods, dyes, or preservatives -pregnant or trying to get pregnant -breast-feeding How should I use this medicine? This drug is given as an infusion into a vein. It is administered in a hospital or clinic by a specially trained health care professional. Talk to your pediatrician regarding the use of this medicine in children. Special care may be needed. Overdosage: If you think you have taken too much of this medicine contact a poison control center or emergency room at once. NOTE: This medicine is only for you. Do not share this medicine with others. What if I miss a dose? It is important not to miss your dose. Call your doctor or health care professional if you are unable to keep an appointment. What may interact with this medicine? -aspirin and aspirin-like medicines -medicines to increase blood counts like filgrastim, pegfilgrastim, sargramostim -methotrexate -NSAIDS, medicines for pain and inflammation, like ibuprofen or naproxen -probenecid -pyrimethamine -vaccines Talk to your doctor or health care professional before taking any of these medicines: -acetaminophen -aspirin -ibuprofen -ketoprofen -naproxen This list may not describe all possible interactions. Give your health care provider a list of all the medicines, herbs, non-prescription drugs, or dietary supplements you use. Also tell them if you smoke, drink alcohol, or use illegal drugs. Some items may interact with your medicine. What should I watch for while using this medicine? Visit your doctor for checks on your progress. This drug may make you feel generally unwell. This is not uncommon, as chemotherapy can affect healthy cells as well as cancer cells. Report any side effects. Continue your course  of treatment even though you feel ill unless your doctor tells you to stop. In some cases, you may be given additional medicines to help with side effects.  Follow all directions for their use. Call your doctor or health care professional for advice if you get a fever, chills or sore throat, or other symptoms of a cold or flu. Do not treat yourself. This drug decreases your body's ability to fight infections. Try to avoid being around people who are sick. This medicine may increase your risk to bruise or bleed. Call your doctor or health care professional if you notice any unusual bleeding. Be careful brushing and flossing your teeth or using a toothpick because you may get an infection or bleed more easily. If you have any dental work done, tell your dentist you are receiving this medicine. Avoid taking products that contain aspirin, acetaminophen, ibuprofen, naproxen, or ketoprofen unless instructed by your doctor. These medicines may hide a fever. Call your doctor or health care professional if you get diarrhea or mouth sores. Do not treat yourself. To protect your kidneys, drink water or other fluids as directed while you are taking this medicine. Men and women must use effective birth control while taking this medicine. You may also need to continue using effective birth control for a time after stopping this medicine. Do not become pregnant while taking this medicine. Tell your doctor right away if you think that you or your partner might be pregnant. There is a potential for serious side effects to an unborn child. Talk to your health care professional or pharmacist for more information. Do not breast-feed an infant while taking this medicine. This medicine may lower sperm counts. What side effects may I notice from receiving this medicine? Side effects that you should report to your doctor or health care professional as soon as possible: -allergic reactions like skin rash, itching or hives, swelling of the face, lips, or tongue -low blood counts - this medicine may decrease the number of white blood cells, red blood cells and platelets. You may be at  increased risk for infections and bleeding. -signs of infection - fever or chills, cough, sore throat, pain or difficulty passing urine -signs of decreased platelets or bleeding - bruising, pinpoint red spots on the skin, black, tarry stools, blood in the urine -signs of decreased red blood cells - unusually weak or tired, fainting spells, lightheadedness -breathing problems, like a dry cough -changes in emotions or moods -chest pain -confusion -diarrhea -high blood pressure -mouth or throat sores or ulcers -pain, swelling, warmth in the leg -pain on swallowing -swelling of the ankles, feet, hands -trouble passing urine or change in the amount of urine -vomiting -yellowing of the eyes or skin Side effects that usually do not require medical attention (report to your doctor or health care professional if they continue or are bothersome): -hair loss -loss of appetite -nausea -stomach upset This list may not describe all possible side effects. Call your doctor for medical advice about side effects. You may report side effects to FDA at 1-800-FDA-1088. Where should I keep my medicine? This drug is given in a hospital or clinic and will not be stored at home. NOTE: This sheet is a summary. It may not cover all possible information. If you have questions about this medicine, talk to your doctor, pharmacist, or health care provider.  2015, Elsevier/Gold Standard. (2007-12-25 13:24:03)

## 2015-01-12 NOTE — Progress Notes (Addendum)
[facility] OFFICE PROGRESS NOTE   Diagnosis:  Non-small cell lung cancer  INTERVAL HISTORY:   Christina Hale returns as scheduled. She completed cycle 4 Alimta/Avastin 12/22/2014. She denies nausea/vomiting. She had a few mouth sores. She utilized Magic mouthwash and biotene. No diarrhea or constipation. No bleeding. Stable dyspnea on exertion. No chest pain. She notes bilateral lower leg swelling right greater than left. The past few weeks she has had pain at the left upper back with coughing and sneezing. No pain with deep inspiration.  Objective:  Vital signs in last 24 hours:  Blood pressure 146/82, pulse 100, temperature 97.7 F (36.5 C), temperature source Oral, height _0  (1.778 m), weight 174 lb 6.4 oz (79.107 kg), SpO2 99 %.    HEENT: No thrush or ulcers. Resp: Lungs clear bilaterally. Cardio: Regular rate and rhythm. GI: Abdomen soft and nontender. No hepatomegaly. Vascular: Pitting lower leg edema bilaterally right greater than left. Mild erythema right lower leg. Neuro: Alert and oriented.  Musculoskeletal: Nontender over the left upper back. Port-A-Cath without erythema.    Lab Results:  Lab Results  Component Value Date   WBC 6.4 01/12/2015   HGB 10.8* 01/12/2015   HCT 33.6* 01/12/2015   MCV 102.8* 01/12/2015   PLT 212 01/12/2015   NEUTROABS 4.6 01/12/2015    Imaging:  No results found.  Medications: I have reviewed the patient's current medications.  Assessment/Plan: 1. Stage IV adenocarcinoma of the lung, dominant left upper lobe mass and spiculated right upper lobe mass  Status post a biopsy of a left hepatic mass confirming metastatic adenocarcinoma consistent with a lung primary, ALK mutation negative, EGFR amplification suboptimal-repeat testing recommend   Staging CT scans 09/27/2013 confirmed a dominant left upper lung mass, a smaller right upper lung mass, left hepatic mass, and left external iliac node.   Cycle 1  Taxol/carboplatin and Avastin per the [facility] Z6109 study 10/09/2013.   Cycle 2 Taxol/carboplatin and Avastin 10/29/2013.   Restaging CT evaluation 11/14/2013 with improvement in the lungs and liver. Left external iliac lymph node was stable.   Cycle 3 Taxol/carboplatin and Avastin 12/02/2013   Restaging CT 01/02/2014 with a mixed response-slightly larger left upper lung mass and mediastinal lymph node, slightly smaller liver mass and iliac node   Maintenance Avastin beginning 01/06/2014   Restaging CT 03/06/2014 with a decrease in the size of the lung masses and a liver lesion  Restaging CT 05/22/2014 with a stable right upper lung nodule, decreased size of a left upper lobe lung mass, increased size of a liver lesion, stable left pelvic nodule/node.  Restaging CT 07/24/2014 with no significant change in the chest, increase in size of the mass within the left hepatic lobe, 2 retroperitoneal lymph nodes increased in size, stable appearance of peritoneal nodules.  brain/ skull metastases documented on an MRI of the brain/cervical spine 09/23/2014, taken off of the E5508 study  Restaging CTs 10/09/2014 -slight decrease in size of the left upper lobe cavitary mass , enlargement of the dominant left liver lesion, no significant change in the size of abdominal or chest lymph nodes except for enlargement of a subcarinal node  Cycle 1 Alimta/Avastin 10/20/2014  Cycle 2 Alimta/Avastin 11/10/2014  Cycle 3 Alimta/Avastin 12/01/2014  Cycle 4 Alimta/Avastin 12/22/2014  MRI brain 01/08/2015 with regression of all 5 brain metastases. 3 still visible. Bone metastases not significantly changed.  Cycle 5 Alimta/Avastin 01/12/2015 2. C. difficile colitis March 2015 3. Hyponatremia/seizure on hospital admission 08/29/2013 4. Chronic right hip "  bursitis". 5. Nausea and vomiting following cycle 1 Taxol/carboplatin, the anti-emetic regimen was adjusted with cycle 2. 6. Severe neutropenia following  cycle 1 Taxol/carboplatin-Neulasta added with cycle 2. 7. 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 9. Anemia. Likely secondary to chemotherapy. She received red cell transfusions 12/20/2013 and 12/21/2013. Improved. 10. Hoarseness-improved, potentially related to toxicity from chemotherapy versus tumor. 11. Right retinal detachment 04/05/2014, status post surgical repair 04/07/2014 12. Low TSH with a borderline elevated T4- she was diagnosed with hyperthyroidism, status post radioactive iodine treatment on 08/21/2014 , started on thyroid hormone replacement by Dr. Forde Dandy 13. History of Proteinuria-mild 14. Decreased right nasolabial fold and right arm/leg weakness 09/22/2014  MRI brain/cervical spine 09/23/2014 revealed 5 brain metastases including a 3.1 cm brainstem metastasis in the left pons, 2 left posterior skull metastases, and a C1 metastasis  Decadron started 09/23/2014 a tapered to off  MRI brain 01/08/2015 with regression of all 5 brain metastases. 3 still visible. Bone metastases not significantly changed. 15. Neck pain, pain with swallowing-likely related to thyroid inflammation from I-131 therapy, improved with Decadron   Disposition: Christina Hale appears stable. She has completed 4 cycles of Alimta/Avastin. Plan to proceed with cycle 5 today as scheduled. The plan is for a restaging CT evaluation after completion of 6 cycles.  The leg edema may be related to hypoalbuminemia. She will elevate her legs and also wear compression stockings. At this point low clinical suspicion for DVT. She understands to contact the office if the edema worsens.  She will return for a follow-up visit and cycle 6 Alimta/Avastin in 3 weeks.  Patient seen with Dr. Benay Spice.    Ned Card ANP/GNP-BC   01/12/2015  10:17 AM  This was a shared visit with Ned Card. Christina Hale was interviewed and examined. The leg edema is most likely secondary to  hypoalbuminemia and immobility. Amlodipine may be contributing.  Julieanne Manson, M.D.

## 2015-01-15 ENCOUNTER — Telehealth: Payer: Self-pay

## 2015-01-15 MED ORDER — ALPRAZOLAM 0.25 MG PO TABS: 0.2500 mg | ORAL_TABLET | Freq: Three times a day (TID) | ORAL | Status: DC | PRN

## 2015-01-15 MED ORDER — MEGESTROL ACETATE 40 MG/ML PO SUSP: 200.0000 mg | Freq: Two times a day (BID) | ORAL | Status: DC

## 2015-01-15 NOTE — Telephone Encounter (Signed)
Christina Hale called stating he had 2 prescriptions need refilling. Please call back.

## 2015-01-15 NOTE — Addendum Note (Signed)
Addended by: Brien Few on: 01/15/2015 05:54 PM   Modules accepted: Orders

## 2015-01-15 NOTE — Telephone Encounter (Signed)
S/w charlie: Christina Hale needs  magestrol, he had some in the house from before but it is not currently on medication list The other is xanax 0.25, also not on the current medication list but in med history. He has enough for today and tomorrow

## 2015-01-31 ENCOUNTER — Other Ambulatory Visit: Payer: Self-pay | Admitting: Oncology

## 2015-02-02 ENCOUNTER — Ambulatory Visit (HOSPITAL_COMMUNITY)
Admission: RE | Admit: 2015-02-02 | Discharge: 2015-02-02 | Disposition: A | Discharge status: Home / Self Care | Payer: BC Managed Care – PPO | Source: Ambulatory Visit | Attending: Oncology | Admitting: Oncology

## 2015-02-02 ENCOUNTER — Ambulatory Visit (HOSPITAL_BASED_OUTPATIENT_CLINIC_OR_DEPARTMENT_OTHER): Payer: BC Managed Care – PPO | Admitting: Oncology

## 2015-02-02 ENCOUNTER — Telehealth: Payer: Self-pay | Admitting: Oncology

## 2015-02-02 ENCOUNTER — Ambulatory Visit (HOSPITAL_BASED_OUTPATIENT_CLINIC_OR_DEPARTMENT_OTHER): Payer: BC Managed Care – PPO

## 2015-02-02 ENCOUNTER — Telehealth: Payer: Self-pay | Admitting: *Deleted

## 2015-02-02 ENCOUNTER — Other Ambulatory Visit (HOSPITAL_BASED_OUTPATIENT_CLINIC_OR_DEPARTMENT_OTHER): Payer: BC Managed Care – PPO

## 2015-02-02 ENCOUNTER — Encounter: Payer: Self-pay | Admitting: Internal Medicine

## 2015-02-02 ENCOUNTER — Other Ambulatory Visit: Payer: Self-pay | Admitting: *Deleted

## 2015-02-02 VITALS — BP 128/71 | HR 98 | Temp 98.1°F | Resp 18 | Ht 70.0 in | Wt 184.3 lb

## 2015-02-02 DIAGNOSIS — C3412 Malignant neoplasm of upper lobe, left bronchus or lung: Secondary | ICD-10-CM

## 2015-02-02 DIAGNOSIS — Z5112 Encounter for antineoplastic immunotherapy: Secondary | ICD-10-CM | POA: Diagnosis not present

## 2015-02-02 DIAGNOSIS — C3410 Malignant neoplasm of upper lobe, unspecified bronchus or lung: Secondary | ICD-10-CM | POA: Diagnosis not present

## 2015-02-02 DIAGNOSIS — Z5111 Encounter for antineoplastic chemotherapy: Secondary | ICD-10-CM

## 2015-02-02 DIAGNOSIS — R600 Localized edema: Secondary | ICD-10-CM | POA: Insufficient documentation

## 2015-02-02 DIAGNOSIS — C7951 Secondary malignant neoplasm of bone: Secondary | ICD-10-CM | POA: Diagnosis not present

## 2015-02-02 DIAGNOSIS — C7931 Secondary malignant neoplasm of brain: Secondary | ICD-10-CM

## 2015-02-02 DIAGNOSIS — M791 Myalgia: Secondary | ICD-10-CM

## 2015-02-02 DIAGNOSIS — C787 Secondary malignant neoplasm of liver and intrahepatic bile duct: Secondary | ICD-10-CM

## 2015-02-02 LAB — COMPREHENSIVE METABOLIC PANEL (CC13)
ALT: 8 U/L (ref 0–55)
ANION GAP: 9 meq/L (ref 3–11)
AST: 14 U/L (ref 5–34)
Albumin: 2.4 g/dL — ABNORMAL LOW (ref 3.5–5.0)
Alkaline Phosphatase: 96 U/L (ref 40–150)
BUN: 8 mg/dL (ref 7.0–26.0)
CHLORIDE: 108 meq/L (ref 98–109)
CO2: 24 meq/L (ref 22–29)
CREATININE: 0.7 mg/dL (ref 0.6–1.1)
Calcium: 9 mg/dL (ref 8.4–10.4)
EGFR: >90 mL/min/{1.73_m2} (ref >90)
GLUCOSE: 94 mg/dL (ref 70–140)
Potassium: 2.9 mEq/L — CL (ref 3.5–5.1)
SODIUM: 140 meq/L (ref 136–145)
Total Bilirubin: 0.25 mg/dL (ref 0.20–1.20)
Total Protein: 6.4 g/dL (ref 6.4–8.3)

## 2015-02-02 LAB — CBC WITH DIFFERENTIAL/PLATELET
BASO%: 1.1 % (ref 0.0–2.0)
Basophils Absolute: 0.1 10*3/uL (ref 0.0–0.1)
EOS%: 2.8 % (ref 0.0–7.0)
Eosinophils Absolute: 0.2 10*3/uL (ref 0.0–0.5)
HCT: 33.2 % — ABNORMAL LOW (ref 34.8–46.6)
HGB: 10.9 g/dL — ABNORMAL LOW (ref 11.6–15.9)
LYMPH%: 12.4 % — AB (ref 14.0–49.7)
MCH: 33.5 pg (ref 25.1–34.0)
MCHC: 32.7 g/dL (ref 31.5–36.0)
MCV: 102.6 fL — ABNORMAL HIGH (ref 79.5–101.0)
MONO#: 0.8 10*3/uL (ref 0.1–0.9)
MONO%: 13.2 % (ref 0.0–14.0)
NEUT#: 4.3 10*3/uL (ref 1.5–6.5)
NEUT%: 70.5 % (ref 38.4–76.8)
PLATELETS: 185 10*3/uL (ref 145–400)
RBC: 3.24 10*6/uL — AB (ref 3.70–5.45)
RDW: 17.4 % — ABNORMAL HIGH (ref 11.2–14.5)
WBC: 6.1 10*3/uL (ref 3.9–10.3)
lymph#: 0.8 10*3/uL — ABNORMAL LOW (ref 0.9–3.3)

## 2015-02-02 LAB — UA PROTEIN, DIPSTICK - CHCC: Protein, ur: NEGATIVE mg/dL

## 2015-02-02 LAB — MAGNESIUM (CC13): MAGNESIUM: 1.5 mg/dL (ref 1.5–2.5)

## 2015-02-02 MED ORDER — SODIUM CHLORIDE 0.9 % IV SOLN
15.0000 mg/kg | Freq: Once | INTRAVENOUS | Status: AC
  Administered 2015-02-02: 1200 mg via INTRAVENOUS
  Filled 2015-02-02: qty 48

## 2015-02-02 MED ORDER — CYANOCOBALAMIN 1000 MCG/ML IJ SOLN
INTRAMUSCULAR | Status: AC
  Filled 2015-02-02: qty 1

## 2015-02-02 MED ORDER — DEXAMETHASONE SODIUM PHOSPHATE 100 MG/10ML IJ SOLN
Freq: Once | INTRAMUSCULAR | Status: AC
  Administered 2015-02-02: 10:00:00 via INTRAVENOUS
  Filled 2015-02-02: qty 4

## 2015-02-02 MED ORDER — SODIUM CHLORIDE 0.9 % IV SOLN
540.0000 mg/m2 | Freq: Once | INTRAVENOUS | Status: AC
  Administered 2015-02-02: 1000 mg via INTRAVENOUS
  Filled 2015-02-02: qty 40

## 2015-02-02 MED ORDER — SODIUM CHLORIDE 0.9 % IV SOLN
Freq: Once | INTRAVENOUS | Status: AC
  Administered 2015-02-02: 10:00:00 via INTRAVENOUS

## 2015-02-02 MED ORDER — SODIUM CHLORIDE 0.9 % IJ SOLN
10.0000 mL | INTRAMUSCULAR | Status: DC | PRN
  Administered 2015-02-02: 10 mL
  Filled 2015-02-02: qty 10

## 2015-02-02 MED ORDER — HEPARIN SOD (PORK) LOCK FLUSH 100 UNIT/ML IV SOLN
500.0000 [IU] | Freq: Once | INTRAVENOUS | Status: AC | PRN
  Administered 2015-02-02: 500 [IU]
  Filled 2015-02-02: qty 5

## 2015-02-02 MED ORDER — POTASSIUM CHLORIDE CRYS ER 20 MEQ PO TBCR: EXTENDED_RELEASE_TABLET | ORAL | Status: DC

## 2015-02-02 MED ORDER — CYANOCOBALAMIN 1000 MCG/ML IJ SOLN
1000.0000 ug | Freq: Once | INTRAMUSCULAR | Status: AC
  Administered 2015-02-02: 1000 ug via INTRAMUSCULAR

## 2015-02-02 NOTE — Progress Notes (Signed)
[facility] OFFICE PROGRESS NOTE   Diagnosis: Non-small cell lung cancer  INTERVAL HISTORY:   Christina Hale returns as scheduled. She completed another cycle of Alimta and Avastin on 01/12/2015. No new neurologic symptoms. Stable right-sided weakness. Stable vision loss. She complains of intermittent pain in the elbows and shoulders. She also has discomfort in the left scapula. She is not taking pain medication. She has right greater than left lower leg swelling.  Objective:  Vital signs in last 24 hours:  Blood pressure 128/71, pulse 98, temperature 98.1 F (36.7 C), temperature source Oral, resp. rate 18, height 5' 10"  (1.778 m), weight 184 lb 4.8 oz (83.598 kg), SpO2 100 %.    HEENT: No thrush Resp: Lungs clear bilaterally Cardio: Regular rate and rhythm GI: No hepatomegaly, nontender Vascular: 1+ pitting edema with chronic stasis change at the right lower leg, trace edema at the left lower leg Neuro: 4/5 strength in the right upper and lower extremity     Portacath/PICC-without erythema  Lab Results:  Lab Results  Component Value Date   WBC 6.1 02/02/2015   HGB 10.9* 02/02/2015   HCT 33.2* 02/02/2015   MCV 102.6* 02/02/2015   PLT 185 02/02/2015   NEUTROABS 4.3 02/02/2015     Medications: I have reviewed the patient's current medications.  Assessment/Plan: 1. Stage IV adenocarcinoma of the lung, dominant left upper lobe mass and spiculated right upper lobe mass  Status post a biopsy of a left hepatic mass confirming metastatic adenocarcinoma consistent with a lung primary, ALK mutation negative, EGFR amplification suboptimal-repeat testing recommend   Staging CT scans 09/27/2013 confirmed a dominant left upper lung mass, a smaller right upper lung mass, left hepatic mass, and left external iliac node.   Cycle 1 Taxol/carboplatin and Avastin per the [facility] B5597 study 10/09/2013.   Cycle 2 Taxol/carboplatin and Avastin 10/29/2013.   Restaging CT  evaluation 11/14/2013 with improvement in the lungs and liver. Left external iliac lymph node was stable.   Cycle 3 Taxol/carboplatin and Avastin 12/02/2013   Restaging CT 01/02/2014 with a mixed response-slightly larger left upper lung mass and mediastinal lymph node, slightly smaller liver mass and iliac node   Maintenance Avastin beginning 01/06/2014   Restaging CT 03/06/2014 with a decrease in the size of the lung masses and a liver lesion  Restaging CT 05/22/2014 with a stable right upper lung nodule, decreased size of a left upper lobe lung mass, increased size of a liver lesion, stable left pelvic nodule/node.  Restaging CT 07/24/2014 with no significant change in the chest, increase in size of the mass within the left hepatic lobe, 2 retroperitoneal lymph nodes increased in size, stable appearance of peritoneal nodules.  brain/ skull metastases documented on an MRI of the brain/cervical spine 09/23/2014, taken off of the E5508 study  Restaging CTs 10/09/2014 -slight decrease in size of the left upper lobe cavitary mass , enlargement of the dominant left liver lesion, no significant change in the size of abdominal or chest lymph nodes except for enlargement of a subcarinal node  Cycle 1 Alimta/Avastin 10/20/2014  Cycle 2 Alimta/Avastin 11/10/2014  Cycle 3 Alimta/Avastin 12/01/2014  Cycle 4 Alimta/Avastin 12/22/2014  Cycle 5 Alimta/Avastin 01/12/2015  Cycle 6 Alimta/Avastin 02/02/2015  MRI brain 01/08/2015 with regression of all 5 brain metastases. 3 still visible. Bone metastases not significantly changed.  Cycle 5 Alimta/Avastin 01/12/2015 2. C. difficile colitis March 2015 3. Hyponatremia/seizure on hospital admission 08/29/2013 4. Chronic right hip "bursitis". 5. Nausea and vomiting following cycle  1 Taxol/carboplatin, the anti-emetic regimen was adjusted with cycle 2. 6. Severe neutropenia following cycle 1 Taxol/carboplatin-Neulasta added with cycle  2. 7. 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 9. Anemia. Likely secondary to chemotherapy. She received red cell transfusions 12/20/2013 and 12/21/2013. Improved. 10. Hoarseness-improved, potentially related to toxicity from chemotherapy versus tumor. 11. Right retinal detachment 04/05/2014, status post surgical repair 04/07/2014 12. Low TSH with a borderline elevated T4- she was diagnosed with hyperthyroidism, status post radioactive iodine treatment on 08/21/2014 , started on thyroid hormone replacement by Dr. Forde Dandy 13. History of Proteinuria-mild 14. Decreased right nasolabial fold and right arm/leg weakness 09/22/2014  MRI brain/cervical spine 09/23/2014 revealed 5 brain metastases including a 3.1 cm brainstem metastasis in the left pons, 2 left posterior skull metastases, and a C1 metastasis  Decadron started 09/23/2014 a tapered to off  MRI brain 01/08/2015 with regression of all 5 brain metastases. 3 still visible. Bone metastases not significantly changed. 15. Neck pain, pain with swallowing-likely related to thyroid inflammation from I-131 therapy, improved with Decadron   Disposition:  Christina Hale will complete another cycle of Alimta/Avastin today. The musculoskeletal pain may be related to a benign condition or she may have developed bone metastases. She will use tramadol as needed and contact us for increased pain.  She will undergo a restaging CT evaluation prior to an office visit in 3 weeks. We will check lower extremity Dopplers to rule out a deep vein thrombosis. I suspect the right leg edema is related to hypoalbuminemia and immobility. She will begin potassium replacement.  Betsy Coder, MD  02/02/2015  9:32 AM

## 2015-02-02 NOTE — Patient Instructions (Signed)
[facility] Discharge Instructions for Patients Receiving Chemotherapy  Today you received the following chemotherapy agents Almita   To help prevent nausea and vomiting after your treatment, we encourage you to take your nausea medication Zofran 4 mg daily as needed for nausea   If you develop nausea and vomiting that is not controlled by your nausea medication, call the clinic.   BELOW ARE SYMPTOMS THAT SHOULD BE REPORTED IMMEDIATELY:  *FEVER GREATER THAN 100.5 F  *CHILLS WITH OR WITHOUT FEVER  NAUSEA AND VOMITING THAT IS NOT CONTROLLED WITH YOUR NAUSEA MEDICATION  *UNUSUAL SHORTNESS OF BREATH  *UNUSUAL BRUISING OR BLEEDING  TENDERNESS IN MOUTH AND THROAT WITH OR WITHOUT PRESENCE OF ULCERS  *URINARY PROBLEMS  *BOWEL PROBLEMS  UNUSUAL RASH Items with * indicate a potential emergency and should be followed up as soon as possible.  Feel free to call the clinic you have any questions or concerns. The clinic phone number is [phone].  Please show the [facility] at check-in to the Emergency Department and triage nurse.

## 2015-02-02 NOTE — Telephone Encounter (Signed)
Pt confirmed labs/ov per 08/29 POF, gave pt AVS and Calendar.... KJ, called and schedule Vas Extremity Lower .Marland KitchenMarland KitchenMarland KitchenMarland Kitchen

## 2015-02-02 NOTE — Telephone Encounter (Signed)
VOICE MAIL AT 2:12PM/ FORWARD AT 4:20PM

## 2015-02-02 NOTE — Telephone Encounter (Signed)
Notified Dr. Benay Spice of negative doppler. Pt has been discharged home.

## 2015-02-02 NOTE — Progress Notes (Signed)
VASCULAR LAB PRELIMINARY  PRELIMINARY  PRELIMINARY  PRELIMINARY  Bilateral lower extremity venous duplex completed.    Preliminary report:  Bilateral:  No evidence of DVT, superficial thrombosis, or Baker's Cyst.   Janie Strothman, RVS 02/02/2015, 2:30 PM

## 2015-02-10 ENCOUNTER — Other Ambulatory Visit: Payer: Self-pay | Admitting: *Deleted

## 2015-02-10 DIAGNOSIS — C3410 Malignant neoplasm of upper lobe, unspecified bronchus or lung: Secondary | ICD-10-CM

## 2015-02-10 NOTE — Progress Notes (Signed)
Per Dr. Benay Spice, change CT scans to w/o contrast due to pt allergic to contrast. Orders placed and allergy list updated. Spoke to [place] in Radiology who noted change and canceled scans w/ contrast for 9/15. POF in to reschedule CT scans w/o contrast for 9/15.

## 2015-02-19 ENCOUNTER — Ambulatory Visit (HOSPITAL_COMMUNITY)
Admission: RE | Admit: 2015-02-19 | Discharge: 2015-02-19 | Disposition: A | Discharge status: Home / Self Care | Payer: BC Managed Care – PPO | Source: Ambulatory Visit | Attending: Oncology | Admitting: Oncology

## 2015-02-19 ENCOUNTER — Encounter (HOSPITAL_COMMUNITY): Payer: Self-pay

## 2015-02-19 ENCOUNTER — Ambulatory Visit (HOSPITAL_COMMUNITY): Payer: BC Managed Care – PPO

## 2015-02-19 DIAGNOSIS — C3410 Malignant neoplasm of upper lobe, unspecified bronchus or lung: Secondary | ICD-10-CM | POA: Diagnosis not present

## 2015-02-19 DIAGNOSIS — Z91041 Radiographic dye allergy status: Secondary | ICD-10-CM | POA: Diagnosis not present

## 2015-02-19 DIAGNOSIS — C787 Secondary malignant neoplasm of liver and intrahepatic bile duct: Secondary | ICD-10-CM | POA: Diagnosis not present

## 2015-02-19 DIAGNOSIS — C7931 Secondary malignant neoplasm of brain: Secondary | ICD-10-CM | POA: Insufficient documentation

## 2015-02-19 DIAGNOSIS — R918 Other nonspecific abnormal finding of lung field: Secondary | ICD-10-CM | POA: Insufficient documentation

## 2015-02-20 ENCOUNTER — Telehealth: Payer: Self-pay | Admitting: *Deleted

## 2015-02-20 NOTE — Telephone Encounter (Signed)
Per Dr. Benay Spice; notified pt that CT showed stable left lung mass and liver lesion, no new disease.  "That's the best news ever; thank you thank you"  Pt verbalized understanding.

## 2015-02-20 NOTE — Telephone Encounter (Signed)
-----   Message from Ladell Pier, MD sent at 02/20/2015  1:19 AM EDT ----- Please call patient, CTs show stable left lung mass and liver lesion, no new disease

## 2015-02-22 ENCOUNTER — Other Ambulatory Visit: Payer: Self-pay | Admitting: Oncology

## 2015-02-23 ENCOUNTER — Encounter: Payer: Self-pay | Admitting: Nurse Practitioner

## 2015-02-23 ENCOUNTER — Ambulatory Visit (HOSPITAL_BASED_OUTPATIENT_CLINIC_OR_DEPARTMENT_OTHER): Payer: BC Managed Care – PPO

## 2015-02-23 ENCOUNTER — Other Ambulatory Visit: Payer: Self-pay | Admitting: Nurse Practitioner

## 2015-02-23 ENCOUNTER — Telehealth: Payer: Self-pay | Admitting: Nurse Practitioner

## 2015-02-23 ENCOUNTER — Ambulatory Visit (HOSPITAL_BASED_OUTPATIENT_CLINIC_OR_DEPARTMENT_OTHER): Payer: BC Managed Care – PPO | Admitting: Nurse Practitioner

## 2015-02-23 ENCOUNTER — Other Ambulatory Visit (HOSPITAL_BASED_OUTPATIENT_CLINIC_OR_DEPARTMENT_OTHER): Payer: BC Managed Care – PPO

## 2015-02-23 ENCOUNTER — Telehealth: Payer: Self-pay | Admitting: *Deleted

## 2015-02-23 VITALS — BP 145/80 | HR 100 | Resp 18

## 2015-02-23 VITALS — BP 149/68 | HR 108 | Temp 97.9°F | Resp 18 | Ht 70.0 in | Wt 186.0 lb

## 2015-02-23 DIAGNOSIS — C3412 Malignant neoplasm of upper lobe, left bronchus or lung: Secondary | ICD-10-CM

## 2015-02-23 DIAGNOSIS — Z5112 Encounter for antineoplastic immunotherapy: Secondary | ICD-10-CM

## 2015-02-23 DIAGNOSIS — Z5111 Encounter for antineoplastic chemotherapy: Secondary | ICD-10-CM | POA: Diagnosis not present

## 2015-02-23 DIAGNOSIS — C7951 Secondary malignant neoplasm of bone: Secondary | ICD-10-CM | POA: Diagnosis not present

## 2015-02-23 DIAGNOSIS — N390 Urinary tract infection, site not specified: Secondary | ICD-10-CM | POA: Diagnosis not present

## 2015-02-23 DIAGNOSIS — C7931 Secondary malignant neoplasm of brain: Secondary | ICD-10-CM

## 2015-02-23 DIAGNOSIS — C3490 Malignant neoplasm of unspecified part of unspecified bronchus or lung: Secondary | ICD-10-CM

## 2015-02-23 DIAGNOSIS — C787 Secondary malignant neoplasm of liver and intrahepatic bile duct: Secondary | ICD-10-CM

## 2015-02-23 DIAGNOSIS — C3410 Malignant neoplasm of upper lobe, unspecified bronchus or lung: Secondary | ICD-10-CM

## 2015-02-23 LAB — CBC WITH DIFFERENTIAL/PLATELET
BASO%: 0.6 % (ref 0.0–2.0)
BASOS ABS: 0 10*3/uL (ref 0.0–0.1)
EOS%: 1.7 % (ref 0.0–7.0)
Eosinophils Absolute: 0.1 10*3/uL (ref 0.0–0.5)
HEMATOCRIT: 34.7 % — AB (ref 34.8–46.6)
HGB: 11.4 g/dL — ABNORMAL LOW (ref 11.6–15.9)
LYMPH#: 0.6 10*3/uL — AB (ref 0.9–3.3)
LYMPH%: 8.8 % — AB (ref 14.0–49.7)
MCH: 32.7 pg (ref 25.1–34.0)
MCHC: 32.8 g/dL (ref 31.5–36.0)
MCV: 99.5 fL (ref 79.5–101.0)
MONO#: 0.5 10*3/uL (ref 0.1–0.9)
MONO%: 6.9 % (ref 0.0–14.0)
NEUT#: 5.4 10*3/uL (ref 1.5–6.5)
NEUT%: 82 % — AB (ref 38.4–76.8)
PLATELETS: 193 10*3/uL (ref 145–400)
RBC: 3.48 10*6/uL — AB (ref 3.70–5.45)
RDW: 16.3 % — ABNORMAL HIGH (ref 11.2–14.5)
WBC: 6.6 10*3/uL (ref 3.9–10.3)

## 2015-02-23 LAB — COMPREHENSIVE METABOLIC PANEL (CC13)
ALT: 6 U/L (ref 0–55)
ANION GAP: 9 meq/L (ref 3–11)
AST: 12 U/L (ref 5–34)
Albumin: 2.3 g/dL — ABNORMAL LOW (ref 3.5–5.0)
Alkaline Phosphatase: 104 U/L (ref 40–150)
BUN: 7.7 mg/dL (ref 7.0–26.0)
CALCIUM: 9.4 mg/dL (ref 8.4–10.4)
CHLORIDE: 103 meq/L (ref 98–109)
CO2: 23 mEq/L (ref 22–29)
Creatinine: 0.7 mg/dL (ref 0.6–1.1)
EGFR: 86 mL/min/{1.73_m2} — ABNORMAL LOW (ref >90)
Glucose: 140 mg/dl (ref 70–140)
POTASSIUM: 3.9 meq/L (ref 3.5–5.1)
Sodium: 135 mEq/L — ABNORMAL LOW (ref 136–145)
Total Bilirubin: 0.32 mg/dL (ref 0.20–1.20)
Total Protein: 7.2 g/dL (ref 6.4–8.3)

## 2015-02-23 LAB — UA PROTEIN, DIPSTICK - CHCC: PROTEIN: NEGATIVE mg/dL

## 2015-02-23 MED ORDER — ALPRAZOLAM 0.25 MG PO TABS: 0.2500 mg | ORAL_TABLET | Freq: Three times a day (TID) | ORAL | Status: DC | PRN

## 2015-02-23 MED ORDER — SODIUM CHLORIDE 0.9 % IV SOLN
540.0000 mg/m2 | Freq: Once | INTRAVENOUS | Status: AC
  Administered 2015-02-23: 1000 mg via INTRAVENOUS
  Filled 2015-02-23: qty 40

## 2015-02-23 MED ORDER — FOLIC ACID 1 MG PO TABS: ORAL_TABLET | ORAL | Status: DC

## 2015-02-23 MED ORDER — SODIUM CHLORIDE 0.9 % IV SOLN
Freq: Once | INTRAVENOUS | Status: AC
  Administered 2015-02-23: 11:00:00 via INTRAVENOUS
  Filled 2015-02-23: qty 4

## 2015-02-23 MED ORDER — BEVACIZUMAB CHEMO INJECTION 400 MG/16ML
15.0000 mg/kg | Freq: Once | INTRAVENOUS | Status: AC
  Administered 2015-02-23: 1200 mg via INTRAVENOUS
  Filled 2015-02-23: qty 48

## 2015-02-23 MED ORDER — AMLODIPINE BESYLATE 5 MG PO TABS: ORAL_TABLET | ORAL | Status: DC

## 2015-02-23 MED ORDER — SODIUM CHLORIDE 0.9 % IV SOLN
Freq: Once | INTRAVENOUS | Status: AC
  Administered 2015-02-23: 11:00:00 via INTRAVENOUS

## 2015-02-23 MED ORDER — POTASSIUM CHLORIDE 20 MEQ/15ML (10%) PO SOLN: 20.0000 meq | Freq: Every day | ORAL | Status: DC

## 2015-02-23 MED ORDER — HEPARIN SOD (PORK) LOCK FLUSH 100 UNIT/ML IV SOLN
500.0000 [IU] | Freq: Once | INTRAVENOUS | Status: AC | PRN
  Administered 2015-02-23: 500 [IU]
  Filled 2015-02-23: qty 5

## 2015-02-23 MED ORDER — SODIUM CHLORIDE 0.9 % IJ SOLN
10.0000 mL | INTRAMUSCULAR | Status: DC | PRN
  Administered 2015-02-23: 10 mL
  Filled 2015-02-23: qty 10

## 2015-02-23 NOTE — Patient Instructions (Signed)
Stop Norvasc and Megace

## 2015-02-23 NOTE — Progress Notes (Signed)
[place] OFFICE PROGRESS NOTE   Diagnosis:  Non-small cell lung cancer  INTERVAL HISTORY:   Christina Hale returns as scheduled. She completed cycle 6 Alimta/Avastin 02/02/2015. She denies nausea/vomiting. She had a few mouth sores. No diarrhea. She denies bleeding. She has stable mild dyspnea on exertion. No chest pain. No abdominal pain. She notes increased leg edema. Her husband thinks the edema is related to her thyroid medication. She has intermittent "joint pain" involving multiple joints. She takes Ultram as needed.  Objective:  Vital signs in last 24 hours:  Blood pressure 149/68, pulse 108, temperature 97.9 F (36.6 C), temperature source Oral, resp. rate 18, height _0  (1.778 m), weight 186 lb (84.369 kg), SpO2 99 %.    HEENT: No thrush or ulcers. Resp: Lungs clear bilaterally. Cardio: Regular rate and rhythm. GI: Abdomen soft and nontender. No hepatomegaly. Vascular: 2+ pitting edema at the lower legs bilaterally right greater than left; chronic stasis change bilaterally. Trace edema at the right lower arm/hand. Neuro: 4 over 5 strength in the right arm and leg.  Port-A-Cath without erythema.   Lab Results:  Lab Results  Component Value Date   WBC 6.6 02/23/2015   HGB 11.4* 02/23/2015   HCT 34.7* 02/23/2015   MCV 99.5 02/23/2015   PLT 193 02/23/2015   NEUTROABS 5.4 02/23/2015    Imaging:  No results found.  Medications: I have reviewed the patient's current medications.  Assessment/Plan: 1. Stage IV adenocarcinoma of the lung, dominant left upper lobe mass and spiculated right upper lobe mass  Status post a biopsy of a left hepatic mass confirming metastatic adenocarcinoma consistent with a lung primary, ALK mutation negative, EGFR amplification suboptimal-repeat testing recommend   Staging CT scans 09/27/2013 confirmed a dominant left upper lung mass, a smaller right upper lung mass, left hepatic mass, and left external iliac node.    Cycle 1 Taxol/carboplatin and Avastin per the [facility] G9924 study 10/09/2013.   Cycle 2 Taxol/carboplatin and Avastin 10/29/2013.   Restaging CT evaluation 11/14/2013 with improvement in the lungs and liver. Left external iliac lymph node was stable.   Cycle 3 Taxol/carboplatin and Avastin 12/02/2013   Restaging CT 01/02/2014 with a mixed response-slightly larger left upper lung mass and mediastinal lymph node, slightly smaller liver mass and iliac node   Maintenance Avastin beginning 01/06/2014   Restaging CT 03/06/2014 with a decrease in the size of the lung masses and a liver lesion  Restaging CT 05/22/2014 with a stable right upper lung nodule, decreased size of a left upper lobe lung mass, increased size of a liver lesion, stable left pelvic nodule/node.  Restaging CT 07/24/2014 with no significant change in the chest, increase in size of the mass within the left hepatic lobe, 2 retroperitoneal lymph nodes increased in size, stable appearance of peritoneal nodules.  brain/ skull metastases documented on an MRI of the brain/cervical spine 09/23/2014, taken off of the E5508 study  Restaging CTs 10/09/2014 -slight decrease in size of the left upper lobe cavitary mass , enlargement of the dominant left liver lesion, no significant change in the size of abdominal or chest lymph nodes except for enlargement of a subcarinal node  Cycle 1 Alimta/Avastin 10/20/2014  Cycle 2 Alimta/Avastin 11/10/2014  Cycle 3 Alimta/Avastin 12/01/2014  Cycle 4 Alimta/Avastin 12/22/2014  Cycle 5 Alimta/Avastin 01/12/2015  Cycle 6 Alimta/Avastin 02/02/2015  MRI brain 01/08/2015 with regression of all 5 brain metastases. 3 still visible. Bone metastases not significantly changed.  Cycle 5 Alimta/Avastin 01/12/2015  Cycle 6 Alimta/Avastin 02/02/2015  Restaging CTs 02/19/2015 with stable left upper lobe cavitary mass, stable right upper lobe lobular nodule, stable left hepatic lobe metastatic  lesion. No evidence of disease progression in the abdomen or pelvis.  Cycle 7 Alimta/Avastin 02/23/2015 2. C. difficile colitis March 2015 3. Hyponatremia/seizure on hospital admission 08/29/2013 4. Chronic right hip "bursitis". 5. Nausea and vomiting following cycle 1 Taxol/carboplatin, the anti-emetic regimen was adjusted with cycle 2. 6. Severe neutropenia following cycle 1 Taxol/carboplatin-Neulasta added with cycle 2. 7. 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 9. Anemia. Likely secondary to chemotherapy. She received red cell transfusions 12/20/2013 and 12/21/2013. Improved. 10. Hoarseness-improved, potentially related to toxicity from chemotherapy versus tumor. 11. Right retinal detachment 04/05/2014, status post surgical repair 04/07/2014 12. Low TSH with a borderline elevated T4- she was diagnosed with hyperthyroidism, status post radioactive iodine treatment on 08/21/2014 , started on thyroid hormone replacement by Dr. Forde Dandy 13. History of Proteinuria-mild 14. Decreased right nasolabial fold and right arm/leg weakness 09/22/2014  MRI brain/cervical spine 09/23/2014 revealed 5 brain metastases including a 3.1 cm brainstem metastasis in the left pons, 2 left posterior skull metastases, and a C1 metastasis  Decadron started 09/23/2014 a tapered to off  MRI brain 01/08/2015 with regression of all 5 brain metastases. 3 still visible. Bone metastases not significantly changed. 15. Neck pain, pain with swallowing-likely related to thyroid inflammation from I-131 therapy, improved with Decadron 16. Bilateral leg edema. Bilateral lower extremity venous Doppler negative 02/02/2015    Disposition: Christina Hale has completed 6 cycles of Alimta/Avastin. The recent restaging CT evaluation shows stable disease. Plan to continue Alimta/Avastin. She will receive cycle 7 today.  The etiology of the leg edema is unclear. We placed Norvasc and  Megace on hold.  She will return for a follow-up visit in 3 weeks. She will contact the office in the interim with any problems.  Patient seen with Dr. Benay Spice.    Ned Card ANP/GNP-BC   02/23/2015  10:40 AM

## 2015-02-23 NOTE — Patient Instructions (Addendum)
[facility] Discharge Instructions for Patients Receiving Chemotherapy  Today you received the following chemotherapy agents: Alimta and  avastin.  To help prevent nausea and vomiting after your treatment, we encourage you to take your nausea medication: Zofran 4 mg every 8 hours as needed.   If you develop nausea and vomiting that is not controlled by your nausea medication, call the clinic.   BELOW ARE SYMPTOMS THAT SHOULD BE REPORTED IMMEDIATELY:  *FEVER GREATER THAN 100.5 F  *CHILLS WITH OR WITHOUT FEVER  NAUSEA AND VOMITING THAT IS NOT CONTROLLED WITH YOUR NAUSEA MEDICATION  *UNUSUAL SHORTNESS OF BREATH  *UNUSUAL BRUISING OR BLEEDING  TENDERNESS IN MOUTH AND THROAT WITH OR WITHOUT PRESENCE OF ULCERS  *URINARY PROBLEMS  *BOWEL PROBLEMS  UNUSUAL RASH Items with * indicate a potential emergency and should be followed up as soon as possible.  Feel free to call the clinic you have any questions or concerns. The clinic phone number is [phone].  Please show the [facility] at check-in to the Emergency Department and triage nurse.

## 2015-02-23 NOTE — Telephone Encounter (Signed)
Per staff message and POF I have scheduled appts. Advised scheduler of appts. JMW  

## 2015-02-23 NOTE — Telephone Encounter (Signed)
per pof to sch pt appt-sent MW email to sch trmt-pt aware of appt-gave avs

## 2015-02-25 LAB — URINE CULTURE

## 2015-02-26 ENCOUNTER — Other Ambulatory Visit: Payer: Self-pay | Admitting: Nurse Practitioner

## 2015-02-26 ENCOUNTER — Telehealth: Payer: Self-pay | Admitting: *Deleted

## 2015-02-26 ENCOUNTER — Other Ambulatory Visit: Payer: Self-pay | Admitting: *Deleted

## 2015-02-26 DIAGNOSIS — C3490 Malignant neoplasm of unspecified part of unspecified bronchus or lung: Secondary | ICD-10-CM

## 2015-02-26 MED ORDER — CIPROFLOXACIN HCL 500 MG PO TABS: 500.0000 mg | ORAL_TABLET | Freq: Two times a day (BID) | ORAL | Status: DC

## 2015-02-26 MED ORDER — TRAMADOL HCL 50 MG PO TABS: 50.0000 mg | ORAL_TABLET | Freq: Four times a day (QID) | ORAL | Status: DC | PRN

## 2015-02-26 MED ORDER — BENZONATATE 100 MG PO CAPS: 100.0000 mg | ORAL_CAPSULE | Freq: Three times a day (TID) | ORAL | Status: DC | PRN

## 2015-02-26 NOTE — Telephone Encounter (Signed)
Received message from pt's husband; reports "she is coughing a lot and has congestion; Dr. Benay Spice gave her 'pearls' last year and it helps a lot; also she needs ultram re-fill"  Per Dr. Benay Spice; notified pt's husband that tessalon perles were since to Rx and re-fill for Ultram is ready for p/u today.  Pt's husband verbalized understanding and expressed appreciation.

## 2015-03-02 ENCOUNTER — Encounter: Payer: Self-pay | Admitting: Nurse Practitioner

## 2015-03-02 ENCOUNTER — Ambulatory Visit (HOSPITAL_COMMUNITY)
Admission: RE | Admit: 2015-03-02 | Discharge: 2015-03-02 | Disposition: A | Discharge status: Home / Self Care | Payer: BC Managed Care – PPO | Source: Ambulatory Visit | Attending: Nurse Practitioner | Admitting: Nurse Practitioner

## 2015-03-02 ENCOUNTER — Telehealth: Payer: Self-pay | Admitting: *Deleted

## 2015-03-02 ENCOUNTER — Ambulatory Visit (HOSPITAL_BASED_OUTPATIENT_CLINIC_OR_DEPARTMENT_OTHER): Payer: BC Managed Care – PPO | Admitting: Nurse Practitioner

## 2015-03-02 VITALS — BP 144/67 | HR 125 | Temp 97.6°F | Resp 18 | Ht 70.0 in | Wt 182.1 lb

## 2015-03-02 DIAGNOSIS — R609 Edema, unspecified: Secondary | ICD-10-CM

## 2015-03-02 DIAGNOSIS — J984 Other disorders of lung: Secondary | ICD-10-CM | POA: Diagnosis not present

## 2015-03-02 DIAGNOSIS — N390 Urinary tract infection, site not specified: Secondary | ICD-10-CM

## 2015-03-02 DIAGNOSIS — C3410 Malignant neoplasm of upper lobe, unspecified bronchus or lung: Secondary | ICD-10-CM

## 2015-03-02 DIAGNOSIS — J40 Bronchitis, not specified as acute or chronic: Secondary | ICD-10-CM | POA: Diagnosis not present

## 2015-03-02 DIAGNOSIS — R05 Cough: Secondary | ICD-10-CM | POA: Diagnosis present

## 2015-03-02 DIAGNOSIS — C3490 Malignant neoplasm of unspecified part of unspecified bronchus or lung: Secondary | ICD-10-CM | POA: Insufficient documentation

## 2015-03-02 MED ORDER — ALBUTEROL SULFATE HFA 108 (90 BASE) MCG/ACT IN AERS: 1.0000 | INHALATION_SPRAY | Freq: Four times a day (QID) | RESPIRATORY_TRACT | Status: AC | PRN

## 2015-03-02 MED ORDER — ALBUTEROL SULFATE (2.5 MG/3ML) 0.083% IN NEBU
2.5000 mg | INHALATION_SOLUTION | Freq: Once | RESPIRATORY_TRACT | Status: AC
  Administered 2015-03-02: 2.5 mg via RESPIRATORY_TRACT
  Filled 2015-03-02: qty 3

## 2015-03-02 MED ORDER — HYDROCOD POLST-CPM POLST ER 10-8 MG/5ML PO SUER: 5.0000 mL | Freq: Two times a day (BID) | ORAL | Status: DC | PRN

## 2015-03-02 MED ORDER — ALBUTEROL SULFATE (2.5 MG/3ML) 0.083% IN NEBU
INHALATION_SOLUTION | RESPIRATORY_TRACT | Status: AC
  Filled 2015-03-02: qty 3

## 2015-03-02 NOTE — Assessment & Plan Note (Addendum)
Patient has chronic peripheral edema to both her right upper extremity and right lower extremity secondary to her brain metastasis diagnosis; and increased weakness to the right side as well.  Patient also has some venous stasis skin changes noted to her right lower extremity as well which are chronic.  Patient's husband reports that patient discontinued both the Norvasc and the Megace within this past week or so; and that the peripheral edema has greatly improved since discontinuing these medications.  Patient was encouraged to keep her legs elevated above the level of for heart whenever possible.  She was also encouraged to keep moisturizers applied to her skin as well.

## 2015-03-02 NOTE — Telephone Encounter (Signed)
Returned call to pt's husband with instructions to take her for chest Xray at [facility] done prior to visit today. He voiced understanding.

## 2015-03-02 NOTE — Assessment & Plan Note (Signed)
Patient was diagnosed last week with a urinary tract infection; and was prescribed Cipro at that time.  Patient reports that she completed Cipro antibiotics just yesterday.  She reports that she begin noticing some mild dysuria again earlier this morning.  She continues to deny any GI symptoms.  She denies any flank pain.  She denies any recent fevers or chills.  Patient was unable to give a urine sample today; but plans to return in the morning with a urine sample.  Patient was advised to call/return to go directly to the emergency department overnight.  If her symptoms worsen.

## 2015-03-02 NOTE — Progress Notes (Signed)
SYMPTOM MANAGEMENT CLINIC   HPI: Christina Hale 64 y.o. female diagnosed with lung cancer; with both brain and bone metastasis.  Currently undergoing Alimta/Avastin chemotherapy regimen.  Patient reports a progressive, congested cough that is keeping her up.  Awake at night for the past week or so.  She states that her husband had the same symptoms just prior to her onset.  She states that the cough is essentially nonproductive.  She states she only experiences shortness of breath with exertion-which is her baseline.  She denies any chest pain, chest pressure, or pain with inspiration.  She denies any recent fevers or chills.  Patient just completed Cipro antibiotics yesterday for treatment of a urinary tract infection.  Patient reports recurrence of some mild dysuria earlier this morning; but denies any other new symptoms whatsoever.  HPI  ROS  Past Medical History  Diagnosis Date  . Hyperlipidemia   . Hypertension   . Prediabetes   . Vitamin D deficiency   . Anxiety   . Lung cancer   . Brain cancer     brain mets metastatic lung ca    Past Surgical History  Procedure Laterality Date  . Lasik Bilateral 2002  . Carpal tunnel release Right 2000  . Abdominal hysterectomy  1999  . Cesarean section      x2    has Hyperlipidemia; Hypertension; Prediabetes; Vitamin D deficiency; Anxiety; Seizures; Smoker; Enteritis due to Clostridium difficile; Liver mass; Medication management; Lung cancer, upper lobe (08/2013) Metastatic to Liver; Esophagitis; Hypoalbuminemia; Brain metastases; Bone metastases; Hyperthyroidism; Bronchitis; UTI (urinary tract infection); and Peripheral edema on her problem list.    is allergic to codeine; contrast media; and prednisolone.    Medication List       This list is accurate as of: 03/02/15  5:59 PM.  Always use your most recent med list.               albuterol 108 (90 BASE) MCG/ACT inhaler  Commonly known as:  PROVENTIL HFA;VENTOLIN HFA    Inhale 1-2 puffs into the lungs every 6 (six) hours as needed for wheezing or shortness of breath.     ALPRAZolam 0.25 MG tablet  Commonly known as:  XANAX  Take 1 tablet (0.25 mg total) by mouth 3 (three) times daily as needed for anxiety.     benzonatate 100 MG capsule  Commonly known as:  TESSALON  Take 1 capsule (100 mg total) by mouth 3 (three) times daily as needed for cough.     chlorpheniramine-HYDROcodone 10-8 MG/5ML Suer  Commonly known as:  TUSSIONEX  Take 5 mLs by mouth every 12 (twelve) hours as needed for cough.     folic acid 1 MG tablet  Commonly known as:  FOLVITE  TAKE 1 TABLET (1 MG TOTAL) BY MOUTH DAILY.     HYDROcodone-acetaminophen 5-325 MG per tablet  Commonly known as:  NORCO  Take 1 tablet by mouth every 6 (six) hours as needed for moderate pain or severe pain.     levothyroxine 125 MCG tablet  Commonly known as:  SYNTHROID, LEVOTHROID  Take 125 mcg by mouth daily before breakfast. titrating up.     magic mouthwash w/lidocaine Soln  Take 5 mLs by mouth 4 (four) times daily as needed for mouth pain (swish for 1 minute, then spit).     multivitamin with minerals Tabs tablet  Take 1 tablet by mouth daily.     ondansetron 4 MG tablet  Commonly known as:  ZOFRAN  Take 1 tablet (4 mg total) by mouth daily as needed for nausea or vomiting.     oxyCODONE 5 MG/5ML solution  Commonly known as:  ROXICODONE  Take 5 mLs (5 mg total) by mouth every 4 (four) hours as needed for severe pain.     potassium chloride 20 MEQ/15ML (10%) Soln  Take 15 mLs (20 mEq total) by mouth daily.     potassium chloride SA 20 MEQ tablet  Commonly known as:  K-DUR,KLOR-CON  Take 20 meq BID for 2 days then take 20 meq daily.     senna-docusate 8.6-50 MG per tablet  Commonly known as:  SENNA S  Take 1 tablet by mouth daily.     traMADol 50 MG tablet  Commonly known as:  ULTRAM  Take 1 tablet (50 mg total) by mouth every 6 (six) hours as needed.         PHYSICAL  EXAMINATION  Oncology Vitals 03/02/2015 02/23/2015 02/23/2015 02/23/2015 02/02/2015 01/12/2015 01/12/2015  Height 178 cm - - 178 cm 178 cm - -  Weight 82.6 kg - - 84.369 kg 83.598 kg - 79.198 kg  Weight (lbs) 182 lbs 2 oz - - 186 lbs 184 lbs 5 oz - 174 lbs 10 oz  BMI (kg/m2) 26.13 kg/m2 - - 26.69 kg/m2 26.44 kg/m2 - -  Temp 97.6 - - 97.9 98.1 - 97.7  Pulse 125 100 108 118 98 100 77  Resp 18 18 - 18 18 - 16  SpO2 97 - - 99 100 - 100  BSA (m2) 2.02 m2 - - 2.04 m2 2.03 m2 - -   BP Readings from Last 3 Encounters:  03/02/15 144/67  02/23/15 145/80  02/23/15 149/68    Physical Exam  Constitutional: She is oriented to person, place, and time.  Patient appears fatigued, slightly weak, frail, and chronically ill.  HENT:  Head: Normocephalic and atraumatic.  Mouth/Throat: Oropharynx is clear and moist.  Eyes: Conjunctivae and EOM are normal. Pupils are equal, round, and reactive to light. Right eye exhibits no discharge. Left eye exhibits no discharge. No scleral icterus.  Neck: Normal range of motion. Neck supple. No JVD present. No tracheal deviation present. No thyromegaly present.  Cardiovascular: Normal rate, regular rhythm, normal heart sounds and intact distal pulses.   Pulmonary/Chest: Effort normal. No respiratory distress. She has wheezes. She has no rales. She exhibits no tenderness.  Abdominal: Soft. Bowel sounds are normal. She exhibits no distension and no mass. There is no tenderness. There is no rebound and no guarding.  Musculoskeletal: Normal range of motion. She exhibits edema. She exhibits no tenderness.  Patient has chronic edema to both her right upper extremity and right lower extremity as baseline.  Lymphadenopathy:    She has no cervical adenopathy.  Neurological: She is alert and oriented to person, place, and time.  Patient has chronic weakness to her right upper extremity as baseline secondary to her brain metastasis diagnosis.  Skin: Skin is warm and dry. No rash noted.  No erythema. No pallor.  Patient has what appears to be venous stasis of her bilateral lower extremities; with the right much greater than the left.  Psychiatric: Affect normal.  Nursing note and vitals reviewed.   LABORATORY DATA:. No visits with results within 3 Day(s) from this visit. Latest known visit with results is:  Appointment on 02/23/2015  Component Date Value Ref Range Status  . Urine Culture, Routine 02/23/2015 Culture, Urine   Final   Comment: Final - =====  COLONY COUNT: ===== 50,000 COLONIES/ML ESCHERICHIA COLI  ------------------------------------------------------------------------  ESCHERICHIA COLI     AMPICILLIN                       MIC      Sensitive        <=2 ug/ml    AMOX/CLAVULANIC                  MIC      Sensitive        <=2 ug/ml    AMPICILLIN/SUL                   MIC      Sensitive        <=2 ug/ml    PIPERACILLIN/TAZO                MIC      Sensitive        <=4 ug/ml    IMIPENEM                         MIC      Sensitive     <=0.25 ug/ml    CEFAZOLIN                        MIC      <=4                  ug/ml    CEFTRIAXONE                      MIC      Sensitive        <=1 ug/ml    CEFTAZIDIME                      MIC      Sensitive        <=1 ug/ml    CEFEPIME                         MIC      Sensitive        <=1 ug/ml    GENTAMICIN                       MIC      Sensitive        <=1 ug/ml    TOBRAMYCIN                       MIC      Sensitive        <=1 ug/ml    CIPROFLOXACIN                                              MIC      Sensitive     <=0.25 ug/ml    LEVOFLOXACIN                     MIC      Sensitive     <=0.12 ug/ml    NITROFURANTOIN                   MIC      Sensitive       <=  16 ug/ml    TRIMETH/SULFA                    MIC      Sensitive       <=20 ug/ml  ------------------------------------------------------------------------ ORAL therapy:A cefazolin MIC of <32 predicts  susceptibility to the oral agents  cefaclor, cefdinir,cefpodoxime,cefprozil,cefuroxime, cephalexin,and loracarbef when used for therapy  of uncomplicated UTIs due to E.coli,K.pneumomiae, and P.mirabilis. PARENTERAL therapy: A cefazolin MIC of >8 indicates resistance to parenteral cefazolin. An alternate test method must be performed to confirm susceptibility to parenteral cefazolin. END OF REPORT   . WBC 02/23/2015 6.6  3.9 - 10.3 10e3/uL Final  . NEUT# 02/23/2015 5.4  1.5 - 6.5 10e3/uL Final  . HGB 02/23/2015 11.4* 11.6 - 15.9 g/dL Final  . HCT 02/23/2015 34.7* 34.8 - 46.6 % Final  . Platelets 02/23/2015 193  145 - 400 10e3/uL Final  . MCV 02/23/2015 99.5  79.5 - 101.0 fL Final  . MCH 02/23/2015 32.7  25.1 - 34.0 pg Final  . MCHC 02/23/2015 32.8  31.5 - 36.0 g/dL Final  . RBC 02/23/2015 3.48* 3.70 - 5.45 10e6/uL Final  . RDW 02/23/2015 16.3* 11.2 - 14.5 % Final  . lymph# 02/23/2015 0.6* 0.9 - 3.3 10e3/uL Final  . MONO# 02/23/2015 0.5  0.1 - 0.9 10e3/uL Final  . Eosinophils Absolute 02/23/2015 0.1  0.0 - 0.5 10e3/uL Final  . Basophils Absolute 02/23/2015 0.0  0.0 - 0.1 10e3/uL Final  . NEUT% 02/23/2015 82.0* 38.4 - 76.8 % Final  . LYMPH% 02/23/2015 8.8* 14.0 - 49.7 % Final  . MONO% 02/23/2015 6.9  0.0 - 14.0 % Final  . EOS% 02/23/2015 1.7  0.0 - 7.0 % Final  . BASO% 02/23/2015 0.6  0.0 - 2.0 % Final  . Sodium 02/23/2015 135* 136 - 145 mEq/L Final  . Potassium 02/23/2015 3.9  3.5 - 5.1 mEq/L Final  . Chloride 02/23/2015 103  98 - 109 mEq/L Final  . CO2 02/23/2015 23  22 - 29 mEq/L Final  . Glucose 02/23/2015 140  70 - 140 mg/dl Final   Glucose reference range is for nonfasting patients. Fasting glucose reference range is 70- 100.  Marland Kitchen BUN 02/23/2015 7.7  7.0 - 26.0 mg/dL Final  . Creatinine 02/23/2015 0.7  0.6 - 1.1 mg/dL Final  . Total Bilirubin 02/23/2015 0.32  0.20 - 1.20 mg/dL Final  . Alkaline Phosphatase 02/23/2015 104  40 - 150 U/L Final  . AST 02/23/2015 12  5 - 34 U/L Final  . ALT 02/23/2015 6  0 - 55  U/L Final  . Total Protein 02/23/2015 7.2  6.4 - 8.3 g/dL Final  . Albumin 02/23/2015 2.3* 3.5 - 5.0 g/dL Final  . Calcium 02/23/2015 9.4  8.4 - 10.4 mg/dL Final  . Anion Gap 02/23/2015 9  3 - 11 mEq/L Final  . EGFR 02/23/2015 86* >90 ml/min/1.73 m2 Final   eGFR is calculated using the CKD-EPI Creatinine Equation (2009)  . Protein, ur 02/23/2015 Negative  Negative- <30 mg/dL Final     RADIOGRAPHIC STUDIES: Dg Chest 2 View  03/02/2015   CLINICAL DATA:  Productive cough. On chemotherapy and radiation therapy for lung cancer.  EXAM: CHEST  2 VIEW  COMPARISON:  CT of 02/19/2015  FINDINGS: A right Port-A-Cath which terminates at the low SVC. Midline trachea. Normal heart size. Atherosclerosis in the transverse aorta. No pleural effusion or pneumothorax. Medial left upper lobe opacity is similar to the scout film from 02/19/2015 CT,  and likely due to partially treated lung mass. There is also a right upper lobe nodular density which is grossly similar to the prior exam. The lung bases are clear. No new consolidation.  IMPRESSION: Bilateral upper lobe pulmonary lesions, grossly similar to the scout film of 02/19/2015.  No evidence of superimposed opacity to suggest pneumonia.   Electronically Signed   By: Abigail Miyamoto M.D.   On: 03/02/2015 14:46    ASSESSMENT/PLAN:    UTI (urinary tract infection) Patient was diagnosed last week with a urinary tract infection; and was prescribed Cipro at that time.  Patient reports that she completed Cipro antibiotics just yesterday.  She reports that she begin noticing some mild dysuria again earlier this morning.  She continues to deny any GI symptoms.  She denies any flank pain.  She denies any recent fevers or chills.  Patient was unable to give a urine sample today; but plans to return in the morning with a urine sample.  Patient was advised to call/return to go directly to the emergency department overnight.  If her symptoms worsen.  Peripheral edema Patient  has chronic peripheral edema to both her right upper extremity and right lower extremity secondary to her brain metastasis diagnosis; and increased weakness to the right side as well.  Patient also has some venous stasis skin changes noted to her right lower extremity as well which are chronic.  Patient's husband reports that patient discontinued both the Norvasc and the Megace within this past week or so; and that the peripheral edema has greatly improved since discontinuing these medications.  Patient was encouraged to keep her legs elevated above the level of for heart whenever possible.  She was also encouraged to keep moisturizers applied to her skin as well.  Lung cancer, upper lobe (08/2013) Metastatic to Liver Patient received cycle 7 of her Alimta/Avastin therapy regimen on 02/23/2015.    She is scheduled to return on 03/16/2015 for labs, visit, and her next cycle of chemotherapy.  Bronchitis Patient reports a progressive, congested cough that is keeping her up.  Awake at night for the past week or so.  She states that her husband had the same symptoms just prior to her onset.  She states that the cough is essentially nonproductive.  She states she only experiences shortness of breath with exertion-which is her baseline.  She denies any chest pain, chest pressure, or pain with inspiration.  She denies any recent fevers or chills.  Patient just completed Cipro antibiotics yesterday for treatment of a urinary tract infection.  On exam.-Patient has wheezing to all lung [place]; but no obvious shortness of breath or distress.  Chest x-ray obtained today revealed no pneumonia or other acute findings.  Bilateral upper lobe pulmonary lesions appear grossly similar to previous scans.  Patient was given an albuterol nebulizer treatment while at the cancer center; which did clear most of the wheezing.  Patient stated she felt much better following the nebulizer treatment.  After consulting with  Dr. Benay Spice on all findings of today's visit.-Patient will be prescribed an albuterol inhaler to try at home.  Patient was also encouraged to continue taking Mucinex over-the-counter as directed.  Patient was also prescribed Tussionex cough syrup to take at bedtime for her cough.  She was advised not to take any other narcotic/sedating medications if she is taking the Tussionex cough syrup.     Patient stated understanding of all instructions; and was in agreement with this plan of care. The patient knows  to call the clinic with any problems, questions or concerns.   Review/collaboration with Dr. Benay Spice regarding all aspects of patient's visit today.   Total time spent with patient was 25 minutes;  with greater than 75 percent of that time spent in face to face counseling regarding patient's symptoms,  and coordination of care and follow up.  Disclaimer:This dictation was prepared with Dragon/digital dictation along with [employer]. Any transcriptional errors that result from this process are unintentional.  Drue Second, NP 03/02/2015

## 2015-03-02 NOTE — Assessment & Plan Note (Signed)
Patient received cycle 7 of her Alimta/Avastin therapy regimen on 02/23/2015.    She is scheduled to return on 03/16/2015 for labs, visit, and her next cycle of chemotherapy.

## 2015-03-02 NOTE — Addendum Note (Signed)
Addended by: Brien Few on: 03/02/2015 12:38 PM   Modules accepted: Orders

## 2015-03-02 NOTE — Assessment & Plan Note (Addendum)
Patient reports a progressive, congested cough that is keeping her up.  Awake at night for the past week or so.  She states that her husband had the same symptoms just prior to her onset.  She states that the cough is essentially nonproductive.  She states she only experiences shortness of breath with exertion-which is her baseline.  She denies any chest pain, chest pressure, or pain with inspiration.  She denies any recent fevers or chills.  Patient just completed Cipro antibiotics yesterday for treatment of a urinary tract infection.  On exam.-Patient has wheezing to all lung [place]; but no obvious shortness of breath or distress.  Chest x-ray obtained today revealed no pneumonia or other acute findings.  Bilateral upper lobe pulmonary lesions appear grossly similar to previous scans.  Patient was given an albuterol nebulizer treatment while at the cancer center; which did clear most of the wheezing.  Patient stated she felt much better following the nebulizer treatment.  After consulting with Dr. Benay Spice on all findings of today's visit.-Patient will be prescribed an albuterol inhaler to try at home.  Patient was also encouraged to continue taking Mucinex over-the-counter as directed.  Patient was also prescribed Tussionex cough syrup to take at bedtime for her cough.  She was advised not to take any other narcotic/sedating medications if she is taking the Tussionex cough syrup.

## 2015-03-02 NOTE — Telephone Encounter (Signed)
Call from pt's husband requesting to work her in to be seen today. Reports her cough is worse. Friend who is a nurse checked temp: 98 axillary. He thinks coughing is making her weaker. Instructed him to have her here by 2PM to see [facility] APP. He voiced understanding.

## 2015-03-03 ENCOUNTER — Telehealth: Payer: Self-pay | Admitting: *Deleted

## 2015-03-03 ENCOUNTER — Ambulatory Visit (HOSPITAL_BASED_OUTPATIENT_CLINIC_OR_DEPARTMENT_OTHER): Payer: BC Managed Care – PPO

## 2015-03-03 DIAGNOSIS — C3410 Malignant neoplasm of upper lobe, unspecified bronchus or lung: Secondary | ICD-10-CM

## 2015-03-03 DIAGNOSIS — R300 Dysuria: Secondary | ICD-10-CM | POA: Diagnosis not present

## 2015-03-03 LAB — URINALYSIS, MICROSCOPIC - CHCC
Bilirubin (Urine): NEGATIVE
Blood: NEGATIVE
Glucose: NEGATIVE mg/dL
Ketones: NEGATIVE mg/dL
NITRITE: NEGATIVE
PROTEIN: 30 mg/dL
Specific Gravity, Urine: 1.015 (ref 1.003–1.035)
UROBILINOGEN UR: 0.2 mg/dL (ref 0.2–1)
pH: 6 (ref 4.6–8.0)

## 2015-03-03 NOTE — Telephone Encounter (Signed)
UA reviewed with Christina Lesser NP and Dr. Benay Spice. Per Dr. Benay Spice, will hold off on ordering another antibiotic until urine culture results are available.  Husband, Eduard Clos, voices understanding; Christina Hale reports symptoms are better today.  Instructed both Christina Hale and husband to call us if symptoms worsen; both voiced understanding.

## 2015-03-04 LAB — URINE CULTURE

## 2015-03-06 ENCOUNTER — Telehealth: Payer: Self-pay | Admitting: *Deleted

## 2015-03-06 NOTE — Telephone Encounter (Signed)
Spoke with patient re: UTI sx.  She denies burning, frequency.  She has no fever or blood in her urine.  She feels like she is better.  Husband is asking about using some kind of spray on her right leg?   Per [employer] note - they are to keep legs elevated and well moisturized.  Husband says he is able to do this.  Let them know to call us for any problems and certainly is her sx of burning with urination return.

## 2015-03-14 ENCOUNTER — Other Ambulatory Visit: Payer: Self-pay | Admitting: Oncology

## 2015-03-16 ENCOUNTER — Ambulatory Visit (HOSPITAL_BASED_OUTPATIENT_CLINIC_OR_DEPARTMENT_OTHER): Payer: BC Managed Care – PPO | Admitting: Nurse Practitioner

## 2015-03-16 ENCOUNTER — Telehealth: Payer: Self-pay | Admitting: Oncology

## 2015-03-16 ENCOUNTER — Other Ambulatory Visit (HOSPITAL_BASED_OUTPATIENT_CLINIC_OR_DEPARTMENT_OTHER): Payer: BC Managed Care – PPO

## 2015-03-16 ENCOUNTER — Ambulatory Visit (HOSPITAL_BASED_OUTPATIENT_CLINIC_OR_DEPARTMENT_OTHER): Payer: BC Managed Care – PPO

## 2015-03-16 VITALS — BP 130/63 | HR 104 | Temp 97.7°F | Resp 18 | Ht 70.0 in | Wt 178.2 lb

## 2015-03-16 DIAGNOSIS — Z5111 Encounter for antineoplastic chemotherapy: Secondary | ICD-10-CM | POA: Diagnosis not present

## 2015-03-16 DIAGNOSIS — C3412 Malignant neoplasm of upper lobe, left bronchus or lung: Secondary | ICD-10-CM | POA: Diagnosis not present

## 2015-03-16 DIAGNOSIS — C7931 Secondary malignant neoplasm of brain: Secondary | ICD-10-CM | POA: Diagnosis not present

## 2015-03-16 DIAGNOSIS — Z5112 Encounter for antineoplastic immunotherapy: Secondary | ICD-10-CM

## 2015-03-16 DIAGNOSIS — C3490 Malignant neoplasm of unspecified part of unspecified bronchus or lung: Secondary | ICD-10-CM

## 2015-03-16 DIAGNOSIS — C7951 Secondary malignant neoplasm of bone: Secondary | ICD-10-CM

## 2015-03-16 DIAGNOSIS — C787 Secondary malignant neoplasm of liver and intrahepatic bile duct: Secondary | ICD-10-CM | POA: Diagnosis not present

## 2015-03-16 DIAGNOSIS — R52 Pain, unspecified: Secondary | ICD-10-CM

## 2015-03-16 LAB — COMPREHENSIVE METABOLIC PANEL (CC13)
ALBUMIN: 2.5 g/dL — AB (ref 3.5–5.0)
ALK PHOS: 101 U/L (ref 40–150)
ALT: <9 U/L (ref 0–55)
AST: 12 U/L (ref 5–34)
Anion Gap: 9 mEq/L (ref 3–11)
BILIRUBIN TOTAL: 0.43 mg/dL (ref 0.20–1.20)
BUN: 6.4 mg/dL — ABNORMAL LOW (ref 7.0–26.0)
CALCIUM: 9.2 mg/dL (ref 8.4–10.4)
CO2: 22 mEq/L (ref 22–29)
Chloride: 104 mEq/L (ref 98–109)
Creatinine: 0.7 mg/dL (ref 0.6–1.1)
EGFR: 90 mL/min/{1.73_m2} — AB (ref >90)
Glucose: 89 mg/dl (ref 70–140)
POTASSIUM: 4.1 meq/L (ref 3.5–5.1)
Sodium: 135 mEq/L — ABNORMAL LOW (ref 136–145)
TOTAL PROTEIN: 7.3 g/dL (ref 6.4–8.3)

## 2015-03-16 LAB — CBC WITH DIFFERENTIAL/PLATELET
BASO%: 0.2 % (ref 0.0–2.0)
BASOS ABS: 0 10*3/uL (ref 0.0–0.1)
EOS ABS: 0.1 10*3/uL (ref 0.0–0.5)
EOS%: 1.5 % (ref 0.0–7.0)
HEMATOCRIT: 35.7 % (ref 34.8–46.6)
HEMOGLOBIN: 11.4 g/dL — AB (ref 11.6–15.9)
LYMPH#: 1 10*3/uL (ref 0.9–3.3)
LYMPH%: 14.6 % (ref 14.0–49.7)
MCH: 31.8 pg (ref 25.1–34.0)
MCHC: 31.9 g/dL (ref 31.5–36.0)
MCV: 99.4 fL (ref 79.5–101.0)
MONO#: 0.5 10*3/uL (ref 0.1–0.9)
MONO%: 8 % (ref 0.0–14.0)
NEUT#: 5 10*3/uL (ref 1.5–6.5)
NEUT%: 75.7 % (ref 38.4–76.8)
NRBC: 0 % (ref 0–0)
PLATELETS: 232 10*3/uL (ref 145–400)
RBC: 3.59 10*6/uL — ABNORMAL LOW (ref 3.70–5.45)
RDW: 17.8 % — ABNORMAL HIGH (ref 11.2–14.5)
WBC: 6.7 10*3/uL (ref 3.9–10.3)

## 2015-03-16 LAB — UA PROTEIN, DIPSTICK - CHCC: Protein, ur: NEGATIVE mg/dL

## 2015-03-16 MED ORDER — SODIUM CHLORIDE 0.9 % IV SOLN
540.0000 mg/m2 | Freq: Once | INTRAVENOUS | Status: AC
  Administered 2015-03-16: 1000 mg via INTRAVENOUS
  Filled 2015-03-16: qty 40

## 2015-03-16 MED ORDER — SODIUM CHLORIDE 0.9 % IV SOLN
15.0000 mg/kg | Freq: Once | INTRAVENOUS | Status: AC
  Administered 2015-03-16: 1200 mg via INTRAVENOUS
  Filled 2015-03-16: qty 48

## 2015-03-16 MED ORDER — SODIUM CHLORIDE 0.9 % IV SOLN
Freq: Once | INTRAVENOUS | Status: AC
  Administered 2015-03-16: 14:00:00 via INTRAVENOUS

## 2015-03-16 MED ORDER — SODIUM CHLORIDE 0.9 % IV SOLN
Freq: Once | INTRAVENOUS | Status: AC
  Administered 2015-03-16: 14:00:00 via INTRAVENOUS
  Filled 2015-03-16: qty 4

## 2015-03-16 MED ORDER — SODIUM CHLORIDE 0.9 % IJ SOLN
10.0000 mL | INTRAMUSCULAR | Status: DC | PRN
  Administered 2015-03-16: 10 mL
  Filled 2015-03-16: qty 10

## 2015-03-16 MED ORDER — SODIUM CHLORIDE 0.9 % IV SOLN: Freq: Once | INTRAVENOUS | Status: DC

## 2015-03-16 MED ORDER — HEPARIN SOD (PORK) LOCK FLUSH 100 UNIT/ML IV SOLN
500.0000 [IU] | Freq: Once | INTRAVENOUS | Status: AC | PRN
  Administered 2015-03-16: 500 [IU]
  Filled 2015-03-16: qty 5

## 2015-03-16 MED ORDER — FOLIC ACID 1 MG PO TABS: ORAL_TABLET | ORAL | Status: AC

## 2015-03-16 MED ORDER — TRAMADOL HCL 50 MG PO TABS: 50.0000 mg | ORAL_TABLET | Freq: Three times a day (TID) | ORAL | Status: DC | PRN

## 2015-03-16 NOTE — Telephone Encounter (Signed)
Add to previous note. At this time atient double booked on 10/31 @ 11:30 am due to date/time per 10/10 pof and other patient in that time slot cannot be moved due to pending ct-scan. LT aware.

## 2015-03-16 NOTE — Progress Notes (Signed)
[place] OFFICE PROGRESS NOTE   Diagnosis:  Non-small cell lung cancer  INTERVAL HISTORY:   Ms. [name] returns as scheduled. She completed cycle 7 Alimta/Avastin 02/23/2015. She denies nausea/vomiting. No mouth sores. No diarrhea. No bleeding. No shortness of breath. No fever. Cough overall is better. She continues Mucinex. She discontinued Tussionex due to hallucinations. Leg swelling is significantly better. She continues to have pain mainly on the left side of her body. She takes tramadol as needed. She typically takes one tablet a day at bedtime. Occasionally she will wake up several hours later in pain. She was not sure if she could take a second tablet.  Objective:  Vital signs in last 24 hours:  Blood pressure 130/63, pulse 104, temperature 97.7 F (36.5 C), temperature source Oral, resp. rate 18, height 5' 10"  (1.778 m), weight 178 lb 3.2 oz (80.831 kg), SpO2 100 %.    HEENT: No thrush or ulcers. Resp: Lungs with a few faint wheezes at the upper lung [place]. No respiratory distress. Cardio: Regular rate and rhythm. GI: No hepatomegaly. Vascular: No significant edema left leg. 1+ pitting edema right lower leg. Chronic stasis change bilaterally. Trace edema right lower arm/hand. Neuro: Alert and oriented. 4/5 strength in the right arm and leg.  Port-A-Cath without erythema.   Lab Results:  Lab Results  Component Value Date   WBC 6.7 03/16/2015   HGB 11.4* 03/16/2015   HCT 35.7 03/16/2015   MCV 99.4 03/16/2015   PLT 232 03/16/2015   NEUTROABS 5.0 03/16/2015    Imaging:  No results found.  Medications: I have reviewed the patient's current medications.  Assessment/Plan: 1. Stage IV adenocarcinoma of the lung, dominant left upper lobe mass and spiculated right upper lobe mass  Status post a biopsy of a left hepatic mass confirming metastatic adenocarcinoma consistent with a lung primary, ALK mutation negative, EGFR amplification suboptimal-repeat  testing recommend   Staging CT scans 09/27/2013 confirmed a dominant left upper lung mass, a smaller right upper lung mass, left hepatic mass, and left external iliac node.   Cycle 1 Taxol/carboplatin and Avastin per the CTSU T0354 study 10/09/2013.   Cycle 2 Taxol/carboplatin and Avastin 10/29/2013.   Restaging CT evaluation 11/14/2013 with improvement in the lungs and liver. Left external iliac lymph node was stable.   Cycle 3 Taxol/carboplatin and Avastin 12/02/2013   Restaging CT 01/02/2014 with a mixed response-slightly larger left upper lung mass and mediastinal lymph node, slightly smaller liver mass and iliac node   Maintenance Avastin beginning 01/06/2014   Restaging CT 03/06/2014 with a decrease in the size of the lung masses and a liver lesion  Restaging CT 05/22/2014 with a stable right upper lung nodule, decreased size of a left upper lobe lung mass, increased size of a liver lesion, stable left pelvic nodule/node.  Restaging CT 07/24/2014 with no significant change in the chest, increase in size of the mass within the left hepatic lobe, 2 retroperitoneal lymph nodes increased in size, stable appearance of peritoneal nodules.  brain/ skull metastases documented on an MRI of the brain/cervical spine 09/23/2014, taken off of the E5508 study  Restaging CTs 10/09/2014 -slight decrease in size of the left upper lobe cavitary mass , enlargement of the dominant left liver lesion, no significant change in the size of abdominal or chest lymph nodes except for enlargement of a subcarinal node  Cycle 1 Alimta/Avastin 10/20/2014  Cycle 2 Alimta/Avastin 11/10/2014  Cycle 3 Alimta/Avastin 12/01/2014  Cycle 4 Alimta/Avastin 12/22/2014  Cycle  5 Alimta/Avastin 01/12/2015  Cycle 6 Alimta/Avastin 02/02/2015  MRI brain 01/08/2015 with regression of all 5 brain metastases. 3 still visible. Bone metastases not significantly changed.  Restaging CTs 02/19/2015 with stable left  upper lobe cavitary mass, stable right upper lobe lobular nodule, stable left hepatic lobe metastatic lesion. No evidence of disease progression in the abdomen or pelvis.  Cycle 7 Alimta/Avastin 02/23/2015  Cycle 8 Alimta/Avastin 03/16/2015 2. C. difficile colitis March 2015 3. Hyponatremia/seizure on hospital admission 08/29/2013 4. Chronic right hip "bursitis". 5. Nausea and vomiting following cycle 1 Taxol/carboplatin, the anti-emetic regimen was adjusted with cycle 2. 6. Severe neutropenia following cycle 1 Taxol/carboplatin-Neulasta added with cycle 2. 7. 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 9. Anemia. Likely secondary to chemotherapy. She received red cell transfusions 12/20/2013 and 12/21/2013. Improved. 10. Hoarseness-improved, potentially related to toxicity from chemotherapy versus tumor. 11. Right retinal detachment 04/05/2014, status post surgical repair 04/07/2014 12. Low TSH with a borderline elevated T4- she was diagnosed with hyperthyroidism, status post radioactive iodine treatment on 08/21/2014 , started on thyroid hormone replacement by Dr. Forde Dandy 13. History of Proteinuria-mild 14. Decreased right nasolabial fold and right arm/leg weakness 09/22/2014  MRI brain/cervical spine 09/23/2014 revealed 5 brain metastases including a 3.1 cm brainstem metastasis in the left pons, 2 left posterior skull metastases, and a C1 metastasis  Decadron started 09/23/2014 a tapered to off  MRI brain 01/08/2015 with regression of all 5 brain metastases. 3 still visible. Bone metastases not significantly changed. 15. Neck pain, pain with swallowing-likely related to thyroid inflammation from I-131 therapy, improved with Decadron 16. Bilateral leg edema. Bilateral lower extremity venous Doppler negative 02/02/2015. Megace and Norvasc discontinued 02/23/2015. Leg edema markedly improved 03/16/2015. 17.  Urine culture 02/23/2015 with  Escherichia coli. She completed a course of ciprofloxacin.     Disposition: Christina Hale appears stable. She has completed 7 cycles of Alimta/Avastin. Plan to proceed with cycle 8 today as scheduled. She will return for a follow-up visit and cycle 9 in 3 weeks.  She was given a new prescription for tramadol 50-100 mg every 8 hours as needed for pain.  Plan reviewed with Dr. Benay Spice.  Ned Card ANP/GNP-BC   03/16/2015  12:42 PM

## 2015-03-16 NOTE — Patient Instructions (Signed)
[place] Discharge Instructions for Patients Receiving Chemotherapy  Today you received the following chemotherapy agents: Avastin and Alimta.  To help prevent nausea and vomiting after your treatment, we encourage you to take your nausea medication: Zofran 4 mg every 8 hours as needed.   If you develop nausea and vomiting that is not controlled by your nausea medication, call the clinic.   BELOW ARE SYMPTOMS THAT SHOULD BE REPORTED IMMEDIATELY:  *FEVER GREATER THAN 100.5 F  *CHILLS WITH OR WITHOUT FEVER  NAUSEA AND VOMITING THAT IS NOT CONTROLLED WITH YOUR NAUSEA MEDICATION  *UNUSUAL SHORTNESS OF BREATH  *UNUSUAL BRUISING OR BLEEDING  TENDERNESS IN MOUTH AND THROAT WITH OR WITHOUT PRESENCE OF ULCERS  *URINARY PROBLEMS  *BOWEL PROBLEMS  UNUSUAL RASH Items with * indicate a potential emergency and should be followed up as soon as possible.  Feel free to call the clinic you have any questions or concerns. The clinic phone number is [phone].  Please show the [place] at check-in to the Emergency Department and triage nurse.

## 2015-03-16 NOTE — Telephone Encounter (Signed)
Gave relative avs report and appointments for October.

## 2015-03-21 ENCOUNTER — Other Ambulatory Visit: Payer: Self-pay | Admitting: Nurse Practitioner

## 2015-04-05 ENCOUNTER — Other Ambulatory Visit: Payer: Self-pay | Admitting: Oncology

## 2015-04-06 ENCOUNTER — Other Ambulatory Visit: Payer: Self-pay | Admitting: *Deleted

## 2015-04-06 ENCOUNTER — Ambulatory Visit (HOSPITAL_BASED_OUTPATIENT_CLINIC_OR_DEPARTMENT_OTHER): Payer: BC Managed Care – PPO

## 2015-04-06 ENCOUNTER — Ambulatory Visit (HOSPITAL_BASED_OUTPATIENT_CLINIC_OR_DEPARTMENT_OTHER): Payer: BC Managed Care – PPO | Admitting: Oncology

## 2015-04-06 ENCOUNTER — Other Ambulatory Visit (HOSPITAL_BASED_OUTPATIENT_CLINIC_OR_DEPARTMENT_OTHER): Payer: BC Managed Care – PPO

## 2015-04-06 ENCOUNTER — Telehealth: Payer: Self-pay | Admitting: Oncology

## 2015-04-06 ENCOUNTER — Telehealth: Payer: Self-pay | Admitting: *Deleted

## 2015-04-06 VITALS — BP 135/67 | HR 93 | Temp 98.2°F | Resp 18 | Ht 70.0 in | Wt 179.0 lb

## 2015-04-06 DIAGNOSIS — Z5111 Encounter for antineoplastic chemotherapy: Secondary | ICD-10-CM

## 2015-04-06 DIAGNOSIS — C7951 Secondary malignant neoplasm of bone: Secondary | ICD-10-CM

## 2015-04-06 DIAGNOSIS — C7931 Secondary malignant neoplasm of brain: Secondary | ICD-10-CM

## 2015-04-06 DIAGNOSIS — Z5112 Encounter for antineoplastic immunotherapy: Secondary | ICD-10-CM

## 2015-04-06 DIAGNOSIS — C3490 Malignant neoplasm of unspecified part of unspecified bronchus or lung: Secondary | ICD-10-CM

## 2015-04-06 DIAGNOSIS — C787 Secondary malignant neoplasm of liver and intrahepatic bile duct: Secondary | ICD-10-CM

## 2015-04-06 DIAGNOSIS — C3412 Malignant neoplasm of upper lobe, left bronchus or lung: Secondary | ICD-10-CM

## 2015-04-06 DIAGNOSIS — C3410 Malignant neoplasm of upper lobe, unspecified bronchus or lung: Secondary | ICD-10-CM

## 2015-04-06 DIAGNOSIS — R21 Rash and other nonspecific skin eruption: Secondary | ICD-10-CM

## 2015-04-06 LAB — CBC WITH DIFFERENTIAL/PLATELET
BASO%: 0.7 % (ref 0.0–2.0)
BASOS ABS: 0 10*3/uL (ref 0.0–0.1)
EOS ABS: 0.1 10*3/uL (ref 0.0–0.5)
EOS%: 1.7 % (ref 0.0–7.0)
HCT: 33.4 % — ABNORMAL LOW (ref 34.8–46.6)
HEMOGLOBIN: 11 g/dL — AB (ref 11.6–15.9)
LYMPH%: 8.9 % — ABNORMAL LOW (ref 14.0–49.7)
MCH: 32 pg (ref 25.1–34.0)
MCHC: 32.8 g/dL (ref 31.5–36.0)
MCV: 97.7 fL (ref 79.5–101.0)
MONO#: 0.6 10*3/uL (ref 0.1–0.9)
MONO%: 10.7 % (ref 0.0–14.0)
NEUT%: 78 % — ABNORMAL HIGH (ref 38.4–76.8)
NEUTROS ABS: 4.2 10*3/uL (ref 1.5–6.5)
PLATELETS: 210 10*3/uL (ref 145–400)
RBC: 3.42 10*6/uL — ABNORMAL LOW (ref 3.70–5.45)
RDW: 19.4 % — AB (ref 11.2–14.5)
WBC: 5.4 10*3/uL (ref 3.9–10.3)
lymph#: 0.5 10*3/uL — ABNORMAL LOW (ref 0.9–3.3)

## 2015-04-06 LAB — COMPREHENSIVE METABOLIC PANEL (CC13)
ALBUMIN: 2.3 g/dL — AB (ref 3.5–5.0)
ALK PHOS: 96 U/L (ref 40–150)
ALT: <9 U/L (ref 0–55)
ANION GAP: 6 meq/L (ref 3–11)
AST: 11 U/L (ref 5–34)
BILIRUBIN TOTAL: 0.37 mg/dL (ref 0.20–1.20)
BUN: 7.5 mg/dL (ref 7.0–26.0)
CALCIUM: 9.4 mg/dL (ref 8.4–10.4)
CO2: 24 mEq/L (ref 22–29)
Chloride: 103 mEq/L (ref 98–109)
Creatinine: 0.7 mg/dL (ref 0.6–1.1)
GLUCOSE: 85 mg/dL (ref 70–140)
Potassium: 3.5 mEq/L (ref 3.5–5.1)
Sodium: 133 mEq/L — ABNORMAL LOW (ref 136–145)
TOTAL PROTEIN: 7.3 g/dL (ref 6.4–8.3)

## 2015-04-06 MED ORDER — SODIUM CHLORIDE 0.9 % IV SOLN
15.0000 mg/kg | Freq: Once | INTRAVENOUS | Status: AC
  Administered 2015-04-06: 1200 mg via INTRAVENOUS
  Filled 2015-04-06: qty 48

## 2015-04-06 MED ORDER — HEPARIN SOD (PORK) LOCK FLUSH 100 UNIT/ML IV SOLN
500.0000 [IU] | Freq: Once | INTRAVENOUS | Status: AC | PRN
  Administered 2015-04-06: 500 [IU]
  Filled 2015-04-06: qty 5

## 2015-04-06 MED ORDER — MINOCYCLINE HCL 100 MG PO CAPS: 100.0000 mg | ORAL_CAPSULE | Freq: Two times a day (BID) | ORAL | Status: DC

## 2015-04-06 MED ORDER — SODIUM CHLORIDE 0.9 % IV SOLN
Freq: Once | INTRAVENOUS | Status: AC
  Administered 2015-04-06: 13:00:00 via INTRAVENOUS
  Filled 2015-04-06: qty 4

## 2015-04-06 MED ORDER — CYANOCOBALAMIN 1000 MCG/ML IJ SOLN
1000.0000 ug | Freq: Once | INTRAMUSCULAR | Status: AC
  Administered 2015-04-06: 1000 ug via INTRAMUSCULAR

## 2015-04-06 MED ORDER — SODIUM CHLORIDE 0.9 % IV SOLN
540.0000 mg/m2 | Freq: Once | INTRAVENOUS | Status: AC
  Administered 2015-04-06: 1000 mg via INTRAVENOUS
  Filled 2015-04-06: qty 40

## 2015-04-06 MED ORDER — SODIUM CHLORIDE 0.9 % IJ SOLN
10.0000 mL | INTRAMUSCULAR | Status: DC | PRN
  Administered 2015-04-06: 10 mL
  Filled 2015-04-06: qty 10

## 2015-04-06 MED ORDER — SODIUM CHLORIDE 0.9 % IV SOLN
Freq: Once | INTRAVENOUS | Status: AC
  Administered 2015-04-06: 13:00:00 via INTRAVENOUS

## 2015-04-06 MED ORDER — CYANOCOBALAMIN 1000 MCG/ML IJ SOLN
INTRAMUSCULAR | Status: AC
  Filled 2015-04-06: qty 1

## 2015-04-06 NOTE — Telephone Encounter (Signed)
Per staff message and POF I have scheduled appts. Advised scheduler of appts. JMW  

## 2015-04-06 NOTE — Patient Instructions (Signed)
[place] Discharge Instructions for Patients Receiving Chemotherapy  Today you received the following chemotherapy agents avastin, alimta  To help prevent nausea and vomiting after your treatment, we encourage you to take your nausea medication   If you develop nausea and vomiting that is not controlled by your nausea medication, call the clinic.   BELOW ARE SYMPTOMS THAT SHOULD BE REPORTED IMMEDIATELY:  *FEVER GREATER THAN 100.5 F  *CHILLS WITH OR WITHOUT FEVER  NAUSEA AND VOMITING THAT IS NOT CONTROLLED WITH YOUR NAUSEA MEDICATION  *UNUSUAL SHORTNESS OF BREATH  *UNUSUAL BRUISING OR BLEEDING  TENDERNESS IN MOUTH AND THROAT WITH OR WITHOUT PRESENCE OF ULCERS  *URINARY PROBLEMS  *BOWEL PROBLEMS  UNUSUAL RASH Items with * indicate a potential emergency and should be followed up as soon as possible.  Feel free to call the clinic you have any questions or concerns. The clinic phone number is [phone].  Please show the [place] at check-in to the Emergency Department and triage nurse.

## 2015-04-06 NOTE — Progress Notes (Signed)
[place] OFFICE PROGRESS NOTE   Diagnosis: Non-small cell lung cancer  INTERVAL HISTORY:   Christina Hale returns as scheduled. She continues Alimta/Avastin. Stable right arm and leg weakness. She has developed a rash at the malar areas bilaterally for the past 3-4 days. She complains of joint pain.  Objective:  Vital signs in last 24 hours:  Blood pressure 135/67, pulse 93, temperature 98.2 F (36.8 C), temperature source Oral, resp. rate 18, height 5' 10"  (1.778 m), weight 179 lb (81.194 kg), SpO2 100 %.    HEENT: No thrush or ulcers Resp: Lungs clear bilaterally Cardio: Regular rate and rhythm GI: No hepatomegaly, nontender Vascular: Trace edema with chronic stasis change at the right greater than left lower leg, trace edema at the right hand, no venous engorgement at the right chest wall or neck Neuro: 3-4 /5 strength at the right arm and leg  Skin: Erythematous pustular rash at the malar areas bilaterally   Portacath/PICC-without erythema  Lab Results:  Lab Results  Component Value Date   WBC 5.4 04/06/2015   HGB 11.0* 04/06/2015   HCT 33.4* 04/06/2015   MCV 97.7 04/06/2015   PLT 210 04/06/2015   NEUTROABS 4.2 04/06/2015    Medications: I have reviewed the patient's current medications.  Assessment/Plan: 1. Stage IV adenocarcinoma of the lung, dominant left upper lobe mass and spiculated right upper lobe mass  Status post a biopsy of a left hepatic mass confirming metastatic adenocarcinoma consistent with a lung primary, ALK mutation negative, EGFR amplification suboptimal-repeat testing recommend   Staging CT scans 09/27/2013 confirmed a dominant left upper lung mass, a smaller right upper lung mass, left hepatic mass, and left external iliac node.   Cycle 1 Taxol/carboplatin and Avastin per the CTSU P1025 study 10/09/2013.   Cycle 2 Taxol/carboplatin and Avastin 10/29/2013.   Restaging CT evaluation 11/14/2013 with improvement in the  lungs and liver. Left external iliac lymph node was stable.   Cycle 3 Taxol/carboplatin and Avastin 12/02/2013   Restaging CT 01/02/2014 with a mixed response-slightly larger left upper lung mass and mediastinal lymph node, slightly smaller liver mass and iliac node   Maintenance Avastin beginning 01/06/2014   Restaging CT 03/06/2014 with a decrease in the size of the lung masses and a liver lesion  Restaging CT 05/22/2014 with a stable right upper lung nodule, decreased size of a left upper lobe lung mass, increased size of a liver lesion, stable left pelvic nodule/node.  Restaging CT 07/24/2014 with no significant change in the chest, increase in size of the mass within the left hepatic lobe, 2 retroperitoneal lymph nodes increased in size, stable appearance of peritoneal nodules.  brain/ skull metastases documented on an MRI of the brain/cervical spine 09/23/2014, taken off of the E5508 study  Restaging CTs 10/09/2014 -slight decrease in size of the left upper lobe cavitary mass , enlargement of the dominant left liver lesion, no significant change in the size of abdominal or chest lymph nodes except for enlargement of a subcarinal node  Cycle 1 Alimta/Avastin 10/20/2014  Cycle 2 Alimta/Avastin 11/10/2014  Cycle 3 Alimta/Avastin 12/01/2014  Cycle 4 Alimta/Avastin 12/22/2014  Cycle 5 Alimta/Avastin 01/12/2015  Cycle 6 Alimta/Avastin 02/02/2015  MRI brain 01/08/2015 with regression of all 5 brain metastases. 3 still visible. Bone metastases not significantly changed.  Restaging CTs 02/19/2015 with stable left upper lobe cavitary mass, stable right upper lobe lobular nodule, stable left hepatic lobe metastatic lesion. No evidence of disease progression in the abdomen or pelvis.  Cycle 7 Alimta/Avastin 02/23/2015  Cycle 8 Alimta/Avastin 03/16/2015  Cycle 9 Alimta/Avastin 04/06/2015 2. C. difficile colitis March 2015 3. Hyponatremia/seizure on hospital admission  08/29/2013 4. Chronic right hip "bursitis". 5. Nausea and vomiting following cycle 1 Taxol/carboplatin, the anti-emetic regimen was adjusted with cycle 2. 6. Severe neutropenia following cycle 1 Taxol/carboplatin-Neulasta added with cycle 2. 7. 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 9. Anemia. Likely secondary to chemotherapy. She received red cell transfusions 12/20/2013 and 12/21/2013. Improved. 10. Hoarseness-improved, potentially related to toxicity from chemotherapy versus tumor. 11. Right retinal detachment 04/05/2014, status post surgical repair 04/07/2014 12. Low TSH with a borderline elevated T4- she was diagnosed with hyperthyroidism, status post radioactive iodine treatment on 08/21/2014 , started on thyroid hormone replacement by Dr. Forde Dandy 13. History of Proteinuria-mild 14. Decreased right nasolabial fold and right arm/leg weakness 09/22/2014  MRI brain/cervical spine 09/23/2014 revealed 5 brain metastases including a 3.1 cm brainstem metastasis in the left pons, 2 left posterior skull metastases, and a C1 metastasis  Decadron started 09/23/2014 a tapered to off  MRI brain 01/08/2015 with regression of all 5 brain metastases. 3 still visible. Bone metastases not significantly changed. 15. Neck pain, pain with swallowing-likely related to thyroid inflammation from I-131 therapy, improved with Decadron 16. Bilateral leg edema. Bilateral lower extremity venous Doppler negative 02/02/2015. Megace and Norvasc discontinued 02/23/2015. Leg edema markedly improved 03/16/2015. 17. Urine culture 02/23/2015 with Escherichia coli. She completed a course of ciprofloxacin. 18. Facial rash-the differential diagnosis includes acne, rosacea, a paraneoplastic rash, or rash secondary to a collagen vascular disorder    Disposition:  Her overall status appears unchanged. The plan is to continue Alimta/Avastin on a 3 week schedule. We will ask  for a home PT/OT evaluation and we prescribed a home wheelchair.  She will begin a course of minocycline for the rash.  Christina Hale will return for an office visit and chemotherapy in 3 weeks.  Betsy Coder, MD  04/06/2015  1:13 PM

## 2015-04-06 NOTE — Telephone Encounter (Signed)
per pof to sch pt appt-sent MW email to sch pt appt-pt to get updated sch b4 leaving trmt room

## 2015-04-08 ENCOUNTER — Encounter: Payer: Self-pay | Admitting: Radiation Therapy

## 2015-04-08 ENCOUNTER — Other Ambulatory Visit: Payer: Self-pay | Admitting: Radiation Therapy

## 2015-04-08 DIAGNOSIS — C7931 Secondary malignant neoplasm of brain: Secondary | ICD-10-CM

## 2015-04-08 NOTE — Progress Notes (Signed)
1.  Do you need a wheel chair?   No, uses a cane    2. On oxygen? no  3. Have you ever had any surgery in the body part being scanned?  no  4. Have you ever had any surgery on your brain or heart?  no                                 5. Have you ever had surgery on your eyes or ears?     no                   6. Do you have a pacemaker or defibrillator?   no  7. Do you have a Neurostimulator?   no 8. Claustrophobic? no  9. Any risk for metal in eyes?  no  10. Injury by bullet, buckshot, or shrapnel?  no  11. Stent?       no                                                                                                                 12. Hx of Cancer?    Yes   Lung cancer with mets to the brain                                                                                                       13. Kidney or Liver disease?  Liver mets   14. Hx of Lupus, Rheumatoid Arthritis or Scleroderma?  no  15. IV Antibiotics or long term use of NSAIDS?  no  16. HX of Hypertension?  yes  17. Diabetes?  no  18. Allergy to contrast?  - Iodine contrast but not MRI   19. Recent labs. Labs drawn 10/31  BUN 7.5 and Creat 0.7   [place]

## 2015-04-16 ENCOUNTER — Ambulatory Visit
Admission: RE | Admit: 2015-04-16 | Discharge: 2015-04-16 | Disposition: A | Discharge status: Home / Self Care | Payer: BC Managed Care – PPO | Source: Ambulatory Visit | Attending: Radiation Oncology | Admitting: Radiation Oncology

## 2015-04-16 DIAGNOSIS — C7931 Secondary malignant neoplasm of brain: Secondary | ICD-10-CM

## 2015-04-16 MED ORDER — GADOBENATE DIMEGLUMINE 529 MG/ML IV SOLN
16.0000 mL | Freq: Once | INTRAVENOUS | Status: AC | PRN
  Administered 2015-04-16: 16 mL via INTRAVENOUS

## 2015-04-19 ENCOUNTER — Encounter: Payer: Self-pay | Admitting: Radiation Therapy

## 2015-04-20 ENCOUNTER — Ambulatory Visit
Admission: RE | Admit: 2015-04-20 | Discharge: 2015-04-20 | Disposition: A | Discharge status: Home / Self Care | Payer: BC Managed Care – PPO | Source: Ambulatory Visit | Attending: Radiation Oncology | Admitting: Radiation Oncology

## 2015-04-20 ENCOUNTER — Encounter: Payer: Self-pay | Admitting: Radiation Oncology

## 2015-04-20 ENCOUNTER — Ambulatory Visit (HOSPITAL_COMMUNITY)
Admission: RE | Admit: 2015-04-20 | Discharge: 2015-04-20 | Disposition: A | Discharge status: Home / Self Care | Payer: BC Managed Care – PPO | Source: Ambulatory Visit | Attending: Radiation Oncology | Admitting: Radiation Oncology

## 2015-04-20 VITALS — BP 143/98 | HR 103 | Resp 16 | Wt 180.1 lb

## 2015-04-20 DIAGNOSIS — M7989 Other specified soft tissue disorders: Secondary | ICD-10-CM | POA: Diagnosis present

## 2015-04-20 DIAGNOSIS — R609 Edema, unspecified: Secondary | ICD-10-CM | POA: Diagnosis not present

## 2015-04-20 DIAGNOSIS — C7931 Secondary malignant neoplasm of brain: Secondary | ICD-10-CM

## 2015-04-20 NOTE — Progress Notes (Addendum)
Weight stable. BP and heart rate elevated. Reports constant joint pain. Reports her last chemotherapy was the week before last. Reports dizziness with standing today. Reports she lacks appetite and has taste changes. Reports Megace was stopped due to swelling in right hand and foot. Husband reports her short term memory is better than his. Denies headache or seizure activity Denies diplopia, ringing in the ears, nausea or vomiting. Responding appropriately and quickly to all questions asked. Became tearful during nurse evaluation. Reports feeling overwhelmed and anxious about MRI results.

## 2015-04-20 NOTE — Progress Notes (Addendum)
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  Left pontine 30 mm target was treated to 21 Gy in 3 fractions of 7 Gy  2.  4 additional smaller brain metastasis targets were treated to 20 Gy in one fraction    ICD-9-CM ICD-10-CM   1. Peripheral edema 782.3 R60.9 [place] Upper Ext Art Right     UE VENOUS DUPLEX  2. Brain metastases (HCC) 198.3 C79.31     Interval Since Last Radiation:  6 months  Narrative: The patient returns today for routine follow-up. BP and heart rate elevated. Reports constant joint pain and dizziness with standing today. Reports her last chemotherapy was the week before last. She reports a lack of appetite and taste changes. Reports Megace was stopped due to swelling in her right hand and feet. Husband reports her short term memory is well. Denies headache or seizure activity, diplopia, ringing in the ears, nausea, vomiting, or fever. Responded appropriately and quickly to all questions.  ALLERGIES:  is allergic to codeine; contrast media; and prednisolone.  Meds:  Current Outpatient Prescriptions on File Prior to Encounter  Medication Sig Dispense Refill  . ALPRAZolam (XANAX) 0.25 MG tablet Take 1 tablet (0.25 mg total) by mouth 3 (three) times daily as needed for anxiety. 90 tablet 0  . folic acid (FOLVITE) 1 MG tablet TAKE 1 TABLET (1 MG TOTAL) BY MOUTH DAILY. 30 tablet 2  . levothyroxine (SYNTHROID, LEVOTHROID) 125 MCG tablet Take 125 mcg by mouth daily before breakfast. titrating up.    . minocycline (MINOCIN) 100 MG capsule Take 1 capsule (100 mg total) by mouth 2 (two) times daily. For 10 days 20 capsule 0  . traMADol (ULTRAM) 50 MG tablet  Take 1-2 tablets (50-100 mg total) by mouth every 8 (eight) hours as needed. 60 tablet 0  . albuterol (PROVENTIL HFA;VENTOLIN HFA) 108 (90 BASE) MCG/ACT inhaler Inhale 1-2 puffs into the lungs every 6 (six) hours as needed for wheezing or shortness of breath. (Patient not taking: Reported on 04/06/2015) 1 Inhaler 2  . Alum & Mag Hydroxide-Simeth (MAGIC MOUTHWASH W/LIDOCAINE) SOLN Take 5 mLs by mouth 4 (four) times daily as needed for mouth pain (swish for 1 minute, then spit). (Patient not taking: Reported on 04/06/2015) 240 mL 0  . benzonatate (TESSALON) 100 MG capsule Take 1 capsule (100 mg total) by mouth 3 (three) times daily as needed for cough. (Patient not taking: Reported on 04/06/2015) 20 capsule 0  . chlorpheniramine-HYDROcodone (TUSSIONEX) 10-8 MG/5ML SUER Take 5 mLs by mouth every 12 (twelve) hours as needed for cough. (Patient not taking: Reported on 04/06/2015) 140 mL 0  . HYDROcodone-acetaminophen (NORCO) 5-325 MG per tablet Take 1 tablet by mouth every 6 (six) hours as needed for moderate pain or severe pain. (Patient not taking: Reported on 02/02/2015) 20 tablet 0  . Multiple Vitamin (MULTIVITAMIN WITH MINERALS) TABS tablet Take 1 tablet by mouth daily.    . ondansetron (ZOFRAN) 4 MG tablet Take 1 tablet (4 mg total) by mouth daily as needed for nausea or vomiting. (Patient not taking: Reported on 04/06/2015) 30 tablet 1  . oxyCODONE (ROXICODONE) 5 MG/5ML solution Take  5 mLs (5 mg total) by mouth every 4 (four) hours as needed for severe pain. (Patient not taking: Reported on 02/02/2015) 240 mL 0  . potassium chloride 20 MEQ/15ML (10%) SOLN TAKE 15 MLS (20 MEQ TOTAL) BY MOUTH DAILY. (Patient not taking: Reported on 04/20/2015) 400 mL 0  . potassium chloride SA (K-DUR,KLOR-CON) 20 MEQ tablet Take 20 meq BID for 2 days then take 20 meq daily. (Patient not taking: Reported on 04/20/2015) 30 tablet 1  . senna-docusate (SENNA S) 8.6-50 MG per tablet Take 1 tablet by mouth daily. (Patient not  taking: Reported on 04/06/2015)     No current facility-administered medications on file prior to encounter.      Physical Findings: The patient is in no acute distress. Patient is alert and oriented.  weight is 180 lb 1.6 oz (81.693 kg). Her blood pressure is 143/98 and her pulse is 103. Her respiration is 16 and oxygen saturation is 100%.  The patient presents to the clinic in a wheelchair. The patient has right hand swelling and redness with pitting edema and no tenderness.  Lab Findings: Lab Results  Component Value Date   WBC 5.4 04/06/2015   WBC 8.9 10/02/2014   HGB 11.0* 04/06/2015   HGB 15.2* 10/02/2014   HCT 33.4* 04/06/2015   HCT 44.6 10/02/2014   PLT 210 04/06/2015   PLT 225 10/02/2014    Lab Results  Component Value Date   NA 133* 04/06/2015   NA 135 10/02/2014   K 3.5 04/06/2015   K 4.4 10/02/2014   CHLORIDE 103 04/06/2015   CO2 24 04/06/2015   CO2 29 10/02/2014   GLUCOSE 85 04/06/2015   GLUCOSE 101* 10/02/2014   BUN 7.5 04/06/2015   BUN 23 10/02/2014   CREATININE 0.7 04/06/2015   CREATININE 0.58 10/02/2014   CREATININE 0.47* 09/18/2014   BILITOT 0.37 04/06/2015   BILITOT 0.5 10/02/2014   ALKPHOS 96 04/06/2015   ALKPHOS 110 10/02/2014   AST 11 04/06/2015   AST 12 10/02/2014   ALT <9 04/06/2015   ALT 18 10/02/2014   PROT 7.3 04/06/2015   PROT 7.5 10/02/2014   ALBUMIN 2.3* 04/06/2015   ALBUMIN 3.4* 10/02/2014   CALCIUM 9.4 04/06/2015   CALCIUM 9.3 10/02/2014   ANIONGAP 6 04/06/2015   ANIONGAP 10 09/18/2014   Radiograpy: CLINICAL DATA: Metastatic lung cancer. Follow-up brain metastasis. Subsequent encounter  EXAM: MRI HEAD WITHOUT AND WITH CONTRAST  TECHNIQUE: Multiplanar, multiecho pulse sequences of the brain and surrounding structures were obtained without and with intravenous contrast.  CONTRAST: 48m MULTIHANCE GADOBENATE DIMEGLUMINE 529 MG/ML IV SOLN  COMPARISON: 01/08/2015, 10/01/2014  FINDINGS: Stereotactic  radiosurgery protocol at 3 Tesla.  Multiple brain metastasis have been treated with radiation.  Metastatic disease to the left occipital mastoid bone just behind the mastoid sinus is similar and may be slightly smaller.  Left pontine lesion appears slightly smaller with less enhancement. This lesion now measures 9 by 6.5 mm and contains a small amount of hemorrhage which has increased slightly in the interval.  Right hippocampal lesion is 3-4 mm and unchanged.  Left occipital parietal cortical lesion is 3-4 mm and slightly smaller  High left parietal lesion seen on 10/01/2014 no longer visualized  No new metastatic deposits identified  Ventricle size is normal. Mild atrophy. Negative for acute infarct. Mild white matter changes appear chronic and are stable.  IMPRESSION: 4 metastatic lesions to the brain are similar to slightly improved. No progression. Left occipital mastoid bone lesion is similar  to improved.  No new lesions. No acute infarct.   Electronically Signed  By: Franchot Gallo M.D.  On: 04/16/2015 09:48    Impression:  The patient is recovering from the effects of radiation. Brain metastases appear to be well controlled. Patient has upper right extremity edema.   Plan:  MRI scan in 3 months and follow up. Patient is considering physical therapy to strengthen her leg. I advise a doppler ultrasound of the right arm to rule out DVT, especially given that her port-a-cath is located on the right side of her body.  _____________________________________  Sheral Apley Tammi Klippel, M.D.         This document serves as a record of services personally performed by Tyler Pita, MD. It was created on his behalf by Darcus Austin, a trained medical scribe. The creation of this record is based on the scribe's personal observations and the provider's statements to them. This document has been checked and approved by the attending provider.

## 2015-04-20 NOTE — Progress Notes (Signed)
Preliminary results by tech - Right Upper Ext. Venous Duplex Completed. Negative for deep and superficial vein thrombosis in the right upper extremity. Left message with Dr. Johny Shears office to call. Velva Harman Lulla Lin[place], BS, RDMS, RVT

## 2015-04-21 ENCOUNTER — Telehealth: Payer: Self-pay | Admitting: Radiation Oncology

## 2015-04-21 NOTE — Telephone Encounter (Signed)
Phoned patient's home. Spoke with patient's husband. Explained the doppler yesterday does NOT show a clot. Encouraged him to have her elevate her arm when she can and for him to softly massage in a circular motion from the finger tips to the elbow to help evacuate some of the fluid collecting in the hand. He verbalized understanding. Phoned Lurlean Leyden of [facility]. No answer. Left message explaining that medical oncology placed a home health referral on 04/06/2015 but, the patient has not heard from [facility].

## 2015-04-26 ENCOUNTER — Other Ambulatory Visit: Payer: Self-pay | Admitting: Oncology

## 2015-04-27 ENCOUNTER — Other Ambulatory Visit (HOSPITAL_BASED_OUTPATIENT_CLINIC_OR_DEPARTMENT_OTHER): Payer: BC Managed Care – PPO

## 2015-04-27 ENCOUNTER — Encounter: Payer: Self-pay | Admitting: Oncology

## 2015-04-27 ENCOUNTER — Telehealth: Payer: Self-pay | Admitting: Oncology

## 2015-04-27 ENCOUNTER — Ambulatory Visit: Payer: BC Managed Care – PPO | Admitting: Nurse Practitioner

## 2015-04-27 ENCOUNTER — Ambulatory Visit (HOSPITAL_BASED_OUTPATIENT_CLINIC_OR_DEPARTMENT_OTHER): Payer: BC Managed Care – PPO

## 2015-04-27 ENCOUNTER — Ambulatory Visit (HOSPITAL_BASED_OUTPATIENT_CLINIC_OR_DEPARTMENT_OTHER): Payer: BC Managed Care – PPO | Admitting: Oncology

## 2015-04-27 VITALS — BP 135/71 | HR 114 | Temp 97.5°F | Resp 18 | Ht 70.0 in | Wt 175.5 lb

## 2015-04-27 DIAGNOSIS — C3490 Malignant neoplasm of unspecified part of unspecified bronchus or lung: Secondary | ICD-10-CM | POA: Diagnosis not present

## 2015-04-27 DIAGNOSIS — C787 Secondary malignant neoplasm of liver and intrahepatic bile duct: Secondary | ICD-10-CM

## 2015-04-27 DIAGNOSIS — C7951 Secondary malignant neoplasm of bone: Secondary | ICD-10-CM

## 2015-04-27 DIAGNOSIS — C3412 Malignant neoplasm of upper lobe, left bronchus or lung: Secondary | ICD-10-CM

## 2015-04-27 DIAGNOSIS — Z5111 Encounter for antineoplastic chemotherapy: Secondary | ICD-10-CM | POA: Diagnosis not present

## 2015-04-27 DIAGNOSIS — C7931 Secondary malignant neoplasm of brain: Secondary | ICD-10-CM | POA: Diagnosis not present

## 2015-04-27 DIAGNOSIS — C3410 Malignant neoplasm of upper lobe, unspecified bronchus or lung: Secondary | ICD-10-CM

## 2015-04-27 DIAGNOSIS — Z5112 Encounter for antineoplastic immunotherapy: Secondary | ICD-10-CM | POA: Diagnosis not present

## 2015-04-27 LAB — COMPREHENSIVE METABOLIC PANEL (CC13)
ALBUMIN: 1.9 g/dL — AB (ref 3.5–5.0)
ALK PHOS: 112 U/L (ref 40–150)
AST: 13 U/L (ref 5–34)
Anion Gap: 10 mEq/L (ref 3–11)
BILIRUBIN TOTAL: 0.37 mg/dL (ref 0.20–1.20)
BUN: 6.7 mg/dL — AB (ref 7.0–26.0)
CALCIUM: 8.8 mg/dL (ref 8.4–10.4)
CO2: 22 mEq/L (ref 22–29)
CREATININE: 0.8 mg/dL (ref 0.6–1.1)
Chloride: 100 mEq/L (ref 98–109)
EGFR: 83 mL/min/{1.73_m2} — ABNORMAL LOW (ref >90)
Glucose: 142 mg/dl — ABNORMAL HIGH (ref 70–140)
POTASSIUM: 3.5 meq/L (ref 3.5–5.1)
SODIUM: 132 meq/L — AB (ref 136–145)
TOTAL PROTEIN: 6.8 g/dL (ref 6.4–8.3)

## 2015-04-27 LAB — CBC WITH DIFFERENTIAL/PLATELET
BASO%: 0.6 % (ref 0.0–2.0)
BASOS ABS: 0 10*3/uL (ref 0.0–0.1)
EOS%: 1 % (ref 0.0–7.0)
Eosinophils Absolute: 0 10*3/uL (ref 0.0–0.5)
HEMATOCRIT: 31.2 % — AB (ref 34.8–46.6)
HEMOGLOBIN: 10.2 g/dL — AB (ref 11.6–15.9)
LYMPH#: 0.5 10*3/uL — AB (ref 0.9–3.3)
LYMPH%: 11.3 % — ABNORMAL LOW (ref 14.0–49.7)
MCH: 30.8 pg (ref 25.1–34.0)
MCHC: 32.5 g/dL (ref 31.5–36.0)
MCV: 94.6 fL (ref 79.5–101.0)
MONO#: 0.6 10*3/uL (ref 0.1–0.9)
MONO%: 11.5 % (ref 0.0–14.0)
NEUT%: 75.6 % (ref 38.4–76.8)
NEUTROS ABS: 3.7 10*3/uL (ref 1.5–6.5)
Platelets: 251 10*3/uL (ref 145–400)
RBC: 3.3 10*6/uL — ABNORMAL LOW (ref 3.70–5.45)
RDW: 18.9 % — AB (ref 11.2–14.5)
WBC: 4.9 10*3/uL (ref 3.9–10.3)

## 2015-04-27 LAB — UA PROTEIN, DIPSTICK - CHCC: Protein, ur: NEGATIVE mg/dL

## 2015-04-27 MED ORDER — SODIUM CHLORIDE 0.9 % IV SOLN
Freq: Once | INTRAVENOUS | Status: AC
  Administered 2015-04-27: 12:00:00 via INTRAVENOUS
  Filled 2015-04-27: qty 4

## 2015-04-27 MED ORDER — SODIUM CHLORIDE 0.9 % IJ SOLN
10.0000 mL | INTRAMUSCULAR | Status: DC | PRN
  Administered 2015-04-27: 10 mL
  Filled 2015-04-27: qty 10

## 2015-04-27 MED ORDER — ALPRAZOLAM 0.25 MG PO TABS: 0.2500 mg | ORAL_TABLET | Freq: Three times a day (TID) | ORAL | Status: DC | PRN

## 2015-04-27 MED ORDER — ALPRAZOLAM 0.25 MG PO TABS: 0.2500 mg | ORAL_TABLET | Freq: Three times a day (TID) | ORAL | Status: AC | PRN

## 2015-04-27 MED ORDER — HEPARIN SOD (PORK) LOCK FLUSH 100 UNIT/ML IV SOLN
500.0000 [IU] | Freq: Once | INTRAVENOUS | Status: AC | PRN
  Administered 2015-04-27: 500 [IU]
  Filled 2015-04-27: qty 5

## 2015-04-27 MED ORDER — SODIUM CHLORIDE 0.9 % IJ SOLN
10.0000 mL | INTRAMUSCULAR | Status: DC | PRN
Start: 2015-04-27 — End: 2015-04-27
  Filled 2015-04-27: qty 10

## 2015-04-27 MED ORDER — SODIUM CHLORIDE 0.9 % IV SOLN
540.0000 mg/m2 | Freq: Once | INTRAVENOUS | Status: AC
  Administered 2015-04-27: 1000 mg via INTRAVENOUS
  Filled 2015-04-27: qty 40

## 2015-04-27 MED ORDER — MEGESTROL ACETATE 40 MG/ML PO SUSP: ORAL | Status: DC

## 2015-04-27 MED ORDER — SODIUM CHLORIDE 0.9 % IV SOLN
Freq: Once | INTRAVENOUS | Status: AC
  Administered 2015-04-27: 12:00:00 via INTRAVENOUS

## 2015-04-27 MED ORDER — SODIUM CHLORIDE 0.9 % IV SOLN
15.0000 mg/kg | Freq: Once | INTRAVENOUS | Status: AC
  Administered 2015-04-27: 1200 mg via INTRAVENOUS
  Filled 2015-04-27: qty 48

## 2015-04-27 MED ORDER — HEPARIN SOD (PORK) LOCK FLUSH 100 UNIT/ML IV SOLN
500.0000 [IU] | Freq: Once | INTRAVENOUS | Status: DC | PRN
  Filled 2015-04-27: qty 5

## 2015-04-27 NOTE — Patient Instructions (Signed)
[facility] Discharge Instructions for Patients Receiving Chemotherapy  Today you received the following chemotherapy agents avastin and alimta   If you develop nausea and vomiting that is not controlled by your nausea medication, call the clinic.   BELOW ARE SYMPTOMS THAT SHOULD BE REPORTED IMMEDIATELY:  *FEVER GREATER THAN 100.5 F  *CHILLS WITH OR WITHOUT FEVER  NAUSEA AND VOMITING THAT IS NOT CONTROLLED WITH YOUR NAUSEA MEDICATION  *UNUSUAL SHORTNESS OF BREATH  *UNUSUAL BRUISING OR BLEEDING  TENDERNESS IN MOUTH AND THROAT WITH OR WITHOUT PRESENCE OF ULCERS  *URINARY PROBLEMS  *BOWEL PROBLEMS  UNUSUAL RASH Items with * indicate a potential emergency and should be followed up as soon as possible.  Feel free to call the clinic you have any questions or concerns. The clinic phone number is [phone].  Please show the [facility] at check-in to the Emergency Department and triage nurse.

## 2015-04-27 NOTE — Telephone Encounter (Signed)
Appointments complete. Patient to get printout in inf area. [facility] will call re ct - patient/relative aware.

## 2015-04-27 NOTE — Progress Notes (Signed)
Christina Hale   Diagnosis: Non-small cell lung cancer  INTERVAL HISTORY:   Christina Hale returns as scheduled. She continues Alimta/Avastin. Stable right arm and leg weakness and edema. Recent Doppler U/S negative for DVT. Appetite is decreased. Husband requesting to restart Megace as edema has not improved off of this medication. She complains of joint pain. Working with home PT/OT.  Objective:  Vital signs in last 24 hours:  Blood pressure 135/71, pulse 114, temperature 97.5 F (36.4 C), temperature source Oral, resp. rate 18, height 5' 10"  (1.778 m), weight 175 lb 8 oz (79.606 kg), SpO2 100 %.    HEENT: No thrush or ulcers Resp: Lungs clear bilaterally Cardio: Regular rate and rhythm GI: No hepatomegaly, nontender Vascular: 1+ edema with chronic stasis change at the right greater than left lower leg, 1+ edema at the right hand, no venous engorgement at the right chest wall or neck Neuro: 3-4 /5 strength at the right arm and leg  Skin: No rashes   Portacath/PICC-without erythema  Lab Results:  Lab Results  Component Value Date   WBC 4.9 04/27/2015   HGB 10.2* 04/27/2015   HCT 31.2* 04/27/2015   MCV 94.6 04/27/2015   PLT 251 04/27/2015   NEUTROABS 3.7 04/27/2015    Medications: I have reviewed the patient's current medications.  Assessment/Plan: 1. Stage IV adenocarcinoma of the lung, dominant left upper lobe mass and spiculated right upper lobe mass  Status post a biopsy of a left hepatic mass confirming metastatic adenocarcinoma consistent with a lung primary, ALK mutation negative, EGFR amplification suboptimal-repeat testing recommend   Staging CT scans 09/27/2013 confirmed a dominant left upper lung mass, a smaller right upper lung mass, left hepatic mass, and left external iliac node.   Cycle 1 Taxol/carboplatin and Avastin per the CTSU B8466 study 10/09/2013.   Cycle 2 Taxol/carboplatin and Avastin 10/29/2013.    Restaging CT evaluation 11/14/2013 with improvement in the lungs and liver. Left external iliac lymph node was stable.   Cycle 3 Taxol/carboplatin and Avastin 12/02/2013   Restaging CT 01/02/2014 with a mixed response-slightly larger left upper lung mass and mediastinal lymph node, slightly smaller liver mass and iliac node   Maintenance Avastin beginning 01/06/2014   Restaging CT 03/06/2014 with a decrease in the size of the lung masses and a liver lesion  Restaging CT 05/22/2014 with a stable right upper lung nodule, decreased size of a left upper lobe lung mass, increased size of a liver lesion, stable left pelvic nodule/node.  Restaging CT 07/24/2014 with no significant change in the chest, increase in size of the mass within the left hepatic lobe, 2 retroperitoneal lymph nodes increased in size, stable appearance of peritoneal nodules.  brain/ skull metastases documented on an MRI of the brain/cervical spine 09/23/2014, taken off of the E5508 study  Restaging CTs 10/09/2014 -slight decrease in size of the left upper lobe cavitary mass , enlargement of the dominant left liver lesion, no significant change in the size of abdominal or chest lymph nodes except for enlargement of a subcarinal node  Cycle 1 Alimta/Avastin 10/20/2014  Cycle 2 Alimta/Avastin 11/10/2014  Cycle 3 Alimta/Avastin 12/01/2014  Cycle 4 Alimta/Avastin 12/22/2014  Cycle 5 Alimta/Avastin 01/12/2015  Cycle 6 Alimta/Avastin 02/02/2015  MRI brain 01/08/2015 with regression of all 5 brain metastases. 3 still visible. Bone metastases not significantly changed.  Restaging CTs 02/19/2015 with stable left upper lobe cavitary mass, stable right upper lobe lobular nodule, stable left hepatic lobe metastatic lesion. No  evidence of disease progression in the abdomen or pelvis.  Cycle 7 Alimta/Avastin 02/23/2015  Cycle 8 Alimta/Avastin 03/16/2015  Cycle 9 Alimta/Avastin 04/06/2015  Cycle 10 Alimta/Avastin  04/27/2015 2. C. difficile colitis March 2015 3. Hyponatremia/seizure on hospital admission 08/29/2013 4. Chronic right hip "bursitis". 5. Nausea and vomiting following cycle 1 Taxol/carboplatin, the anti-emetic regimen was adjusted with cycle 2. 6. Severe neutropenia following cycle 1 Taxol/carboplatin-Neulasta added with cycle 2. 7. 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 9. Anemia. Likely secondary to chemotherapy. She received red cell transfusions 12/20/2013 and 12/21/2013. Improved. 10. Hoarseness-improved, potentially related to toxicity from chemotherapy versus tumor. 11. Right retinal detachment 04/05/2014, status post surgical repair 04/07/2014 12. Low TSH with a borderline elevated T4- she was diagnosed with hyperthyroidism, status post radioactive iodine treatment on 08/21/2014 , started on thyroid hormone replacement by Dr. Forde Dandy 13. History of Proteinuria-mild 14. Decreased right nasolabial fold and right arm/leg weakness 09/22/2014  MRI brain/cervical spine 09/23/2014 revealed 5 brain metastases including a 3.1 cm brainstem metastasis in the left pons, 2 left posterior skull metastases, and a C1 metastasis  Decadron started 09/23/2014 a tapered to off  MRI brain 01/08/2015 with regression of all 5 brain metastases. 3 still visible. Bone metastases not significantly changed. 15. Neck pain, pain with swallowing-likely related to thyroid inflammation from I-131 therapy, improved with Decadron 16. Bilateral leg edema. Bilateral lower extremity venous Doppler negative 02/02/2015. Megace and Norvasc discontinued 02/23/2015. Leg edema markedly improved 03/16/2015. 17. Urine culture 02/23/2015 with Escherichia coli. She completed a course of ciprofloxacin. 18. Facial rash-the differential diagnosis includes acne, rosacea, a paraneoplastic rash, or rash secondary to a collagen vascular disorder    Disposition:  Her overall  status appears unchanged. The plan is to continue Alimta/Avastin on a 3 week schedule. Proceed with cycle 10 today as scheduled. Restaging CT scans to be done prior to her next cycle of chemo.   Megace prescription written for patient per husband's request. Xanax refilled.   Christina Hale will return for an office visit and chemotherapy in 3 weeks.  Plan reviewed with Dr. Benay Spice.   Mikey Bussing, DNP, AGPCNP-BC, AOCNP  04/27/2015  2:34 PM

## 2015-05-07 ENCOUNTER — Ambulatory Visit (HOSPITAL_COMMUNITY)
Admission: RE | Admit: 2015-05-07 | Discharge: 2015-05-07 | Disposition: A | Discharge status: Home / Self Care | Payer: BC Managed Care – PPO | Source: Ambulatory Visit | Attending: Oncology | Admitting: Oncology

## 2015-05-07 ENCOUNTER — Other Ambulatory Visit: Payer: Self-pay | Admitting: Oncology

## 2015-05-07 DIAGNOSIS — C3410 Malignant neoplasm of upper lobe, unspecified bronchus or lung: Secondary | ICD-10-CM | POA: Insufficient documentation

## 2015-05-07 DIAGNOSIS — Z9221 Personal history of antineoplastic chemotherapy: Secondary | ICD-10-CM | POA: Insufficient documentation

## 2015-05-07 DIAGNOSIS — C3490 Malignant neoplasm of unspecified part of unspecified bronchus or lung: Secondary | ICD-10-CM

## 2015-05-07 DIAGNOSIS — R911 Solitary pulmonary nodule: Secondary | ICD-10-CM | POA: Diagnosis not present

## 2015-05-07 DIAGNOSIS — C787 Secondary malignant neoplasm of liver and intrahepatic bile duct: Secondary | ICD-10-CM | POA: Diagnosis not present

## 2015-05-07 DIAGNOSIS — C786 Secondary malignant neoplasm of retroperitoneum and peritoneum: Secondary | ICD-10-CM | POA: Diagnosis not present

## 2015-05-08 ENCOUNTER — Telehealth: Payer: Self-pay | Admitting: *Deleted

## 2015-05-08 NOTE — Telephone Encounter (Signed)
-----   Message from Ladell Pier, MD sent at 05/07/2015  5:29 PM EST ----- Please call patient CT is stable, plan to continue current therapy, f/u as scheduled

## 2015-05-08 NOTE — Telephone Encounter (Signed)
Per Dr. Benay Spice; notified pt and husband that CT is stable, plan to continue current therapy, f/u as scheduled.  Pt and husband verbalized understanding and expressed appreciation for call.

## 2015-05-17 ENCOUNTER — Other Ambulatory Visit: Payer: Self-pay | Admitting: Oncology

## 2015-05-18 ENCOUNTER — Encounter: Payer: Self-pay | Admitting: Oncology

## 2015-05-18 ENCOUNTER — Telehealth: Payer: Self-pay | Admitting: Oncology

## 2015-05-18 ENCOUNTER — Telehealth: Payer: Self-pay | Admitting: *Deleted

## 2015-05-18 ENCOUNTER — Other Ambulatory Visit (HOSPITAL_BASED_OUTPATIENT_CLINIC_OR_DEPARTMENT_OTHER): Payer: BC Managed Care – PPO

## 2015-05-18 ENCOUNTER — Ambulatory Visit (HOSPITAL_BASED_OUTPATIENT_CLINIC_OR_DEPARTMENT_OTHER): Payer: BC Managed Care – PPO

## 2015-05-18 ENCOUNTER — Ambulatory Visit (HOSPITAL_BASED_OUTPATIENT_CLINIC_OR_DEPARTMENT_OTHER): Payer: BC Managed Care – PPO | Admitting: Oncology

## 2015-05-18 VITALS — BP 134/69 | HR 89

## 2015-05-18 VITALS — BP 139/75 | HR 109 | Temp 97.6°F | Resp 18 | Ht 70.0 in | Wt 173.7 lb

## 2015-05-18 DIAGNOSIS — Z5111 Encounter for antineoplastic chemotherapy: Secondary | ICD-10-CM | POA: Diagnosis not present

## 2015-05-18 DIAGNOSIS — C787 Secondary malignant neoplasm of liver and intrahepatic bile duct: Secondary | ICD-10-CM | POA: Diagnosis not present

## 2015-05-18 DIAGNOSIS — E876 Hypokalemia: Secondary | ICD-10-CM

## 2015-05-18 DIAGNOSIS — R21 Rash and other nonspecific skin eruption: Secondary | ICD-10-CM

## 2015-05-18 DIAGNOSIS — C3490 Malignant neoplasm of unspecified part of unspecified bronchus or lung: Secondary | ICD-10-CM

## 2015-05-18 DIAGNOSIS — C7951 Secondary malignant neoplasm of bone: Secondary | ICD-10-CM

## 2015-05-18 DIAGNOSIS — C3412 Malignant neoplasm of upper lobe, left bronchus or lung: Secondary | ICD-10-CM

## 2015-05-18 DIAGNOSIS — Z5112 Encounter for antineoplastic immunotherapy: Secondary | ICD-10-CM | POA: Diagnosis not present

## 2015-05-18 DIAGNOSIS — C3410 Malignant neoplasm of upper lobe, unspecified bronchus or lung: Secondary | ICD-10-CM

## 2015-05-18 DIAGNOSIS — C7931 Secondary malignant neoplasm of brain: Secondary | ICD-10-CM | POA: Diagnosis not present

## 2015-05-18 LAB — COMPREHENSIVE METABOLIC PANEL
AST: 9 U/L (ref 5–34)
Albumin: 2 g/dL — ABNORMAL LOW (ref 3.5–5.0)
Alkaline Phosphatase: 101 U/L (ref 40–150)
Anion Gap: 10 mEq/L (ref 3–11)
BILIRUBIN TOTAL: 0.38 mg/dL (ref 0.20–1.20)
BUN: 5.4 mg/dL — ABNORMAL LOW (ref 7.0–26.0)
CO2: 22 meq/L (ref 22–29)
CREATININE: 0.8 mg/dL (ref 0.6–1.1)
Calcium: 8.7 mg/dL (ref 8.4–10.4)
Chloride: 102 mEq/L (ref 98–109)
EGFR: 81 mL/min/{1.73_m2} — ABNORMAL LOW (ref >90)
GLUCOSE: 123 mg/dL (ref 70–140)
Potassium: 3.2 mEq/L — ABNORMAL LOW (ref 3.5–5.1)
SODIUM: 134 meq/L — AB (ref 136–145)
TOTAL PROTEIN: 7.2 g/dL (ref 6.4–8.3)

## 2015-05-18 LAB — CBC WITH DIFFERENTIAL/PLATELET
BASO%: 0.4 % (ref 0.0–2.0)
Basophils Absolute: 0 10*3/uL (ref 0.0–0.1)
EOS%: 2.2 % (ref 0.0–7.0)
Eosinophils Absolute: 0.1 10*3/uL (ref 0.0–0.5)
HCT: 31.5 % — ABNORMAL LOW (ref 34.8–46.6)
HEMOGLOBIN: 10.1 g/dL — AB (ref 11.6–15.9)
LYMPH#: 0.6 10*3/uL — AB (ref 0.9–3.3)
LYMPH%: 11.3 % — AB (ref 14.0–49.7)
MCH: 30.8 pg (ref 25.1–34.0)
MCHC: 32.2 g/dL (ref 31.5–36.0)
MCV: 95.9 fL (ref 79.5–101.0)
MONO#: 0.5 10*3/uL (ref 0.1–0.9)
MONO%: 9 % (ref 0.0–14.0)
NEUT%: 77.1 % — AB (ref 38.4–76.8)
NEUTROS ABS: 4.1 10*3/uL (ref 1.5–6.5)
Platelets: 267 10*3/uL (ref 145–400)
RBC: 3.28 10*6/uL — ABNORMAL LOW (ref 3.70–5.45)
RDW: 22 % — ABNORMAL HIGH (ref 11.2–14.5)
WBC: 5.3 10*3/uL (ref 3.9–10.3)

## 2015-05-18 LAB — UA PROTEIN, DIPSTICK - CHCC

## 2015-05-18 MED ORDER — PEMETREXED DISODIUM CHEMO INJECTION 500 MG
550.0000 mg/m2 | Freq: Once | INTRAVENOUS | Status: AC
  Administered 2015-05-18: 1000 mg via INTRAVENOUS
  Filled 2015-05-18: qty 40

## 2015-05-18 MED ORDER — SODIUM CHLORIDE 0.9 % IJ SOLN
10.0000 mL | INTRAMUSCULAR | Status: DC | PRN
  Administered 2015-05-18: 10 mL
  Filled 2015-05-18: qty 10

## 2015-05-18 MED ORDER — CLINDAMYCIN PHOSPHATE 1 % EX GEL: Freq: Two times a day (BID) | CUTANEOUS | Status: AC

## 2015-05-18 MED ORDER — SODIUM CHLORIDE 0.9 % IV SOLN
15.0000 mg/kg | Freq: Once | INTRAVENOUS | Status: AC
  Administered 2015-05-18: 1200 mg via INTRAVENOUS
  Filled 2015-05-18: qty 48

## 2015-05-18 MED ORDER — SODIUM CHLORIDE 0.9 % IV SOLN
Freq: Once | INTRAVENOUS | Status: AC
  Administered 2015-05-18: 11:00:00 via INTRAVENOUS

## 2015-05-18 MED ORDER — SODIUM CHLORIDE 0.9 % IV SOLN
Freq: Once | INTRAVENOUS | Status: AC
  Administered 2015-05-18: 12:00:00 via INTRAVENOUS
  Filled 2015-05-18: qty 4

## 2015-05-18 MED ORDER — HEPARIN SOD (PORK) LOCK FLUSH 100 UNIT/ML IV SOLN
500.0000 [IU] | Freq: Once | INTRAVENOUS | Status: AC | PRN
  Administered 2015-05-18: 500 [IU]
  Filled 2015-05-18: qty 5

## 2015-05-18 NOTE — Telephone Encounter (Signed)
Gave and printed appt sched and avs fo rpt for Jan °

## 2015-05-18 NOTE — Progress Notes (Signed)
[facility] OFFICE PROGRESS NOTE   Diagnosis: Non-small cell lung cancer  INTERVAL HISTORY:   Christina Hale returns as scheduled. She continues Alimta/Avastin. Edema to the right leg has improved. Stable right arm weakness and edema. Appetite has improved. She complains of joint pain. Working with home PT/OT. The patient had restaging CT scans. Patient notes a rash to her face which comes and goes.  Objective:  Vital signs in last 24 hours:  Blood pressure 139/75, pulse 109, temperature 97.6 F (36.4 C), temperature source Oral, resp. rate 18, height 5' 10"  (1.778 m), weight 173 lb 11.2 oz (78.79 kg), SpO2 100 %.    HEENT: No thrush or ulcers Resp: Lungs clear bilaterally Cardio: Regular rate and rhythm GI: No hepatomegaly, nontender Vascular: 1+ edema with chronic stasis change at the right greater than left lower leg, 1+ edema at the right hand, no venous engorgement at the right chest wall or neck Neuro: 3-4 /5 strength at the right arm and leg  Skin: Rash noted to bilateral cheeks.   Portacath/PICC-without erythema  Lab Results:  Lab Results  Component Value Date   WBC 5.3 05/18/2015   HGB 10.1* 05/18/2015   HCT 31.5* 05/18/2015   MCV 95.9 05/18/2015   PLT 267 05/18/2015   NEUTROABS 4.1 05/18/2015    Medications: I have reviewed the patient's current medications.  Assessment/Plan: 1. Stage IV adenocarcinoma of the lung, dominant left upper lobe mass and spiculated right upper lobe mass  Status post a biopsy of a left hepatic mass confirming metastatic adenocarcinoma consistent with a lung primary, ALK mutation negative, EGFR amplification suboptimal-repeat testing recommend   Staging CT scans 09/27/2013 confirmed a dominant left upper lung mass, a smaller right upper lung mass, left hepatic mass, and left external iliac node.   Cycle 1 Taxol/carboplatin and Avastin per the CTSU Z3664 study 10/09/2013.   Cycle 2 Taxol/carboplatin and Avastin  10/29/2013.   Restaging CT evaluation 11/14/2013 with improvement in the lungs and liver. Left external iliac lymph node was stable.   Cycle 3 Taxol/carboplatin and Avastin 12/02/2013   Restaging CT 01/02/2014 with a mixed response-slightly larger left upper lung mass and mediastinal lymph node, slightly smaller liver mass and iliac node   Maintenance Avastin beginning 01/06/2014   Restaging CT 03/06/2014 with a decrease in the size of the lung masses and a liver lesion  Restaging CT 05/22/2014 with a stable right upper lung nodule, decreased size of a left upper lobe lung mass, increased size of a liver lesion, stable left pelvic nodule/node.  Restaging CT 07/24/2014 with no significant change in the chest, increase in size of the mass within the left hepatic lobe, 2 retroperitoneal lymph nodes increased in size, stable appearance of peritoneal nodules.  brain/ skull metastases documented on an MRI of the brain/cervical spine 09/23/2014, taken off of the E5508 study  Restaging CTs 10/09/2014 -slight decrease in size of the left upper lobe cavitary mass , enlargement of the dominant left liver lesion, no significant change in the size of abdominal or chest lymph nodes except for enlargement of a subcarinal node  Cycle 1 Alimta/Avastin 10/20/2014  Cycle 2 Alimta/Avastin 11/10/2014  Cycle 3 Alimta/Avastin 12/01/2014  Cycle 4 Alimta/Avastin 12/22/2014  Cycle 5 Alimta/Avastin 01/12/2015  Cycle 6 Alimta/Avastin 02/02/2015  MRI brain 01/08/2015 with regression of all 5 brain metastases. 3 still visible. Bone metastases not significantly changed.  Restaging CTs 02/19/2015 with stable left upper lobe cavitary mass, stable right upper lobe lobular nodule, stable left  hepatic lobe metastatic lesion. No evidence of disease progression in the abdomen or pelvis.  Cycle 7 Alimta/Avastin 02/23/2015  Cycle 8 Alimta/Avastin 03/16/2015  Cycle 9 Alimta/Avastin 04/06/2015  Cycle 10  Alimta/Avastin 04/27/2015  Restaging CT of the chest abdomen and pelvis performed on 05/07/2015 showed an essentially stable left upper lobe paramediastinal mass. Potential new mild thickening in the mediastinal pleural surface in the anterior mediastinum left. Mild increase in subcarinal lymph node size. Prevascular lymph nodes are not changed. Stable right upper lobe pulmonary nodule. The round lesion in the left lateral hepatic lobe is slightly contraction side. No new hepatic lesions. Perineal nodular metastasis similar compared to CT of 10/09/2014 and 02/19/2015.  Cycle 11 Alimta/Avastin 05/18/2015 2. C. difficile colitis March 2015 3. Hyponatremia/seizure on hospital admission 08/29/2013 4. Chronic right hip "bursitis". 5. Nausea and vomiting following cycle 1 Taxol/carboplatin, the anti-emetic regimen was adjusted with cycle 2. 6. Severe neutropenia following cycle 1 Taxol/carboplatin-Neulasta added with cycle 2. 7. 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 9. Anemia. Likely secondary to chemotherapy. She received red cell transfusions 12/20/2013 and 12/21/2013. Improved. 10. Hoarseness-improved, potentially related to toxicity from chemotherapy versus tumor. 11. Right retinal detachment 04/05/2014, status post surgical repair 04/07/2014 12. Low TSH with a borderline elevated T4- she was diagnosed with hyperthyroidism, status post radioactive iodine treatment on 08/21/2014 , started on thyroid hormone replacement by Dr. Forde Dandy 13. History of Proteinuria-mild 14. Decreased right nasolabial fold and right arm/leg weakness 09/22/2014  MRI brain/cervical spine 09/23/2014 revealed 5 brain metastases including a 3.1 cm brainstem metastasis in the left pons, 2 left posterior skull metastases, and a C1 metastasis  Decadron started 09/23/2014 a tapered to off  MRI brain 01/08/2015 with regression of all 5 brain metastases. 3 still visible. Bone  metastases not significantly changed. 15. Neck pain, pain with swallowing-likely related to thyroid inflammation from I-131 therapy, improved with Decadron 16. Bilateral leg edema. Bilateral lower extremity venous Doppler negative 02/02/2015. Megace and Norvasc discontinued 02/23/2015. Leg edema markedly improved 03/16/2015. 17. Urine culture 02/23/2015 with Escherichia coli. She completed a course of ciprofloxacin. 18. Facial rash-the differential diagnosis includes acne, rosacea, a paraneoplastic rash, or rash secondary to a collagen vascular disorder    Disposition:  Patient was seen and examined with Dr. Benay Spice. CT scan results were reviewed and showed stable disease. Her overall status appears unchanged. The plan is to continue Alimta/Avastin on a 3 week schedule. Proceed with cycle 11 today as scheduled.    Her potassium level is slightly low and she will begin K Dur 20 and make use daily for 7 days. The patient's husband states that he has liquid potassium at home which he will start today.  For her rash, she will begin clindamycin gel to her face twice a day.  Ms. Buist will return for an office visit and chemotherapy in 3 weeks.   Christina Bussing, DNP, AGPCNP-BC, AOCNP  05/18/2015  2:52 PM   This was a shared visit with Christina Hale. The restaging CT showed stable disease.  Plan to continue Alimta/avastin.  Christina Manson, MD

## 2015-05-18 NOTE — Telephone Encounter (Signed)
Per staff message and POF I have tried to schedule appts, no available due to holiday.  Advised scheduler to check for approval for another day . JMW

## 2015-05-18 NOTE — Patient Instructions (Signed)
[facility] Discharge Instructions for Patients Receiving Chemotherapy  Today you received the following chemotherapy agents: Alimta and Avastin.  To help prevent nausea and vomiting after your treatment, we encourage you to take your nausea medication: Zofran 4 mg as directed.   If you develop nausea and vomiting that is not controlled by your nausea medication, call the clinic.   BELOW ARE SYMPTOMS THAT SHOULD BE REPORTED IMMEDIATELY:  *FEVER GREATER THAN 100.5 F  *CHILLS WITH OR WITHOUT FEVER  NAUSEA AND VOMITING THAT IS NOT CONTROLLED WITH YOUR NAUSEA MEDICATION  *UNUSUAL SHORTNESS OF BREATH  *UNUSUAL BRUISING OR BLEEDING  TENDERNESS IN MOUTH AND THROAT WITH OR WITHOUT PRESENCE OF ULCERS  *URINARY PROBLEMS  *BOWEL PROBLEMS  UNUSUAL RASH Items with * indicate a potential emergency and should be followed up as soon as possible.  Feel free to call the clinic you have any questions or concerns. The clinic phone number is [phone].  Please show the [facility] at check-in to the Emergency Department and triage nurse.

## 2015-05-22 ENCOUNTER — Telehealth: Payer: Self-pay | Admitting: Oncology

## 2015-05-22 NOTE — Telephone Encounter (Signed)
s.w pt and advised on chemo on 1.4 pt ok and aware

## 2015-06-01 ENCOUNTER — Other Ambulatory Visit: Payer: Self-pay | Admitting: Oncology

## 2015-06-05 ENCOUNTER — Encounter: Payer: Self-pay | Admitting: Pharmacist

## 2015-06-09 ENCOUNTER — Telehealth: Payer: Self-pay | Admitting: *Deleted

## 2015-06-09 ENCOUNTER — Telehealth: Payer: Self-pay | Admitting: Oncology

## 2015-06-09 ENCOUNTER — Ambulatory Visit (HOSPITAL_BASED_OUTPATIENT_CLINIC_OR_DEPARTMENT_OTHER): Payer: BC Managed Care – PPO | Admitting: Oncology

## 2015-06-09 ENCOUNTER — Ambulatory Visit (HOSPITAL_COMMUNITY)
Admission: RE | Admit: 2015-06-09 | Discharge: 2015-06-09 | Disposition: A | Discharge status: Home / Self Care | Payer: BC Managed Care – PPO | Source: Ambulatory Visit | Attending: Oncology | Admitting: Oncology

## 2015-06-09 ENCOUNTER — Other Ambulatory Visit: Payer: Self-pay | Admitting: *Deleted

## 2015-06-09 ENCOUNTER — Ambulatory Visit: Payer: BC Managed Care – PPO

## 2015-06-09 ENCOUNTER — Other Ambulatory Visit (HOSPITAL_BASED_OUTPATIENT_CLINIC_OR_DEPARTMENT_OTHER): Payer: BC Managed Care – PPO

## 2015-06-09 ENCOUNTER — Encounter (HOSPITAL_COMMUNITY): Payer: Self-pay

## 2015-06-09 VITALS — BP 130/62 | HR 115 | Temp 97.8°F | Resp 18 | Ht 70.0 in | Wt 167.2 lb

## 2015-06-09 DIAGNOSIS — C7951 Secondary malignant neoplasm of bone: Secondary | ICD-10-CM

## 2015-06-09 DIAGNOSIS — C3490 Malignant neoplasm of unspecified part of unspecified bronchus or lung: Secondary | ICD-10-CM | POA: Diagnosis not present

## 2015-06-09 DIAGNOSIS — C3412 Malignant neoplasm of upper lobe, left bronchus or lung: Secondary | ICD-10-CM

## 2015-06-09 DIAGNOSIS — R609 Edema, unspecified: Secondary | ICD-10-CM

## 2015-06-09 DIAGNOSIS — C7989 Secondary malignant neoplasm of other specified sites: Secondary | ICD-10-CM

## 2015-06-09 DIAGNOSIS — C7931 Secondary malignant neoplasm of brain: Secondary | ICD-10-CM | POA: Diagnosis not present

## 2015-06-09 DIAGNOSIS — C7949 Secondary malignant neoplasm of other parts of nervous system: Principal | ICD-10-CM

## 2015-06-09 DIAGNOSIS — G620 Drug-induced polyneuropathy: Secondary | ICD-10-CM

## 2015-06-09 DIAGNOSIS — R531 Weakness: Secondary | ICD-10-CM | POA: Insufficient documentation

## 2015-06-09 DIAGNOSIS — C787 Secondary malignant neoplasm of liver and intrahepatic bile duct: Secondary | ICD-10-CM

## 2015-06-09 DIAGNOSIS — C3410 Malignant neoplasm of upper lobe, unspecified bronchus or lung: Secondary | ICD-10-CM

## 2015-06-09 DIAGNOSIS — I878 Other specified disorders of veins: Secondary | ICD-10-CM

## 2015-06-09 DIAGNOSIS — J341 Cyst and mucocele of nose and nasal sinus: Secondary | ICD-10-CM | POA: Diagnosis not present

## 2015-06-09 DIAGNOSIS — D649 Anemia, unspecified: Secondary | ICD-10-CM

## 2015-06-09 LAB — CBC WITH DIFFERENTIAL/PLATELET
BASO%: 0.5 % (ref 0.0–2.0)
BASOS ABS: 0 10*3/uL (ref 0.0–0.1)
EOS ABS: 0.1 10*3/uL (ref 0.0–0.5)
EOS%: 1.8 % (ref 0.0–7.0)
HCT: 27 % — ABNORMAL LOW (ref 34.8–46.6)
HGB: 8.7 g/dL — ABNORMAL LOW (ref 11.6–15.9)
LYMPH%: 7.6 % — AB (ref 14.0–49.7)
MCH: 31.5 pg (ref 25.1–34.0)
MCHC: 32 g/dL (ref 31.5–36.0)
MCV: 98.4 fL (ref 79.5–101.0)
MONO#: 0.7 10*3/uL (ref 0.1–0.9)
MONO%: 10.8 % (ref 0.0–14.0)
NEUT#: 5.3 10*3/uL (ref 1.5–6.5)
NEUT%: 79.3 % — AB (ref 38.4–76.8)
Platelets: 234 10*3/uL (ref 145–400)
RBC: 2.75 10*6/uL — AB (ref 3.70–5.45)
RDW: 23.6 % — ABNORMAL HIGH (ref 11.2–14.5)
WBC: 6.7 10*3/uL (ref 3.9–10.3)
lymph#: 0.5 10*3/uL — ABNORMAL LOW (ref 0.9–3.3)

## 2015-06-09 LAB — COMPREHENSIVE METABOLIC PANEL
ANION GAP: 9 meq/L (ref 3–11)
AST: 9 U/L (ref 5–34)
Albumin: 1.8 g/dL — ABNORMAL LOW (ref 3.5–5.0)
Alkaline Phosphatase: 95 U/L (ref 40–150)
BILIRUBIN TOTAL: 0.34 mg/dL (ref 0.20–1.20)
BUN: 8.9 mg/dL (ref 7.0–26.0)
CALCIUM: 8.7 mg/dL (ref 8.4–10.4)
CHLORIDE: 102 meq/L (ref 98–109)
CO2: 21 meq/L — AB (ref 22–29)
CREATININE: 0.7 mg/dL (ref 0.6–1.1)
EGFR: 88 mL/min/{1.73_m2} — ABNORMAL LOW (ref >90)
Glucose: 153 mg/dl — ABNORMAL HIGH (ref 70–140)
Potassium: 3.6 mEq/L (ref 3.5–5.1)
Sodium: 132 mEq/L — ABNORMAL LOW (ref 136–145)
TOTAL PROTEIN: 6.9 g/dL (ref 6.4–8.3)

## 2015-06-09 MED ORDER — TRAMADOL HCL 50 MG PO TABS: 50.0000 mg | ORAL_TABLET | Freq: Three times a day (TID) | ORAL | Status: DC | PRN

## 2015-06-09 MED ORDER — TRAMADOL HCL 50 MG PO TABS: 50.0000 mg | ORAL_TABLET | Freq: Three times a day (TID) | ORAL | Status: AC | PRN

## 2015-06-09 MED ORDER — SODIUM CHLORIDE 0.9 % IJ SOLN
10.0000 mL | INTRAMUSCULAR | Status: AC | PRN
  Filled 2015-06-09: qty 10

## 2015-06-09 MED ORDER — IOHEXOL 300 MG/ML  SOLN
75.0000 mL | Freq: Once | INTRAMUSCULAR | Status: AC | PRN
  Administered 2015-06-09: 75 mL via INTRAVENOUS

## 2015-06-09 MED ORDER — HEPARIN SOD (PORK) LOCK FLUSH 100 UNIT/ML IV SOLN
500.0000 [IU] | Freq: Once | INTRAVENOUS | Status: AC
  Filled 2015-06-09: qty 5

## 2015-06-09 NOTE — Telephone Encounter (Signed)
Per staff message and POF I have scheduled appts. Advised scheduler of appts. JMW  

## 2015-06-09 NOTE — Telephone Encounter (Signed)
per pof to sch trmt-per reply from MW-cld & spoke to pt and gave pt time & date

## 2015-06-09 NOTE — Addendum Note (Signed)
Addended by: Domenic Schwab on: 06/09/2015 10:16 AM   Modules accepted: Orders

## 2015-06-09 NOTE — Telephone Encounter (Signed)
1.  Do you need a wheel chair?  YES  2. On oxygen? NO  3. Have you ever had any surgery in the body part being scanned?  NO  4. Have you ever had any surgery on your brain or heart? NO                       5. Have you ever had surgery on your eyes or ears?    YES TO EYE LASER                          6. Do you have a pacemaker or defibrillator?  NO  7. Do you have a Neurostimulator?  NO  8. Claustrophobic?  YES  9. Any risk for metal in eyes? NO  10. Injury by bullet, buckshot, or shrapnel?  NO  11. Stent?  NO                                                                                                                 12. Hx of Cancer? YES LUNG  13. Kidney or Liver disease?  NO  14. Hx of Lupus, Rheumatoid Arthritis or Scleroderma? NO  15. IV Antibiotics or long term use of NSAIDS?   NO  16. HX of Hypertension?  NO  17. Diabetes?  NO  18. Allergy to contrast?  NO  19. Recent labs. YES  20. No history of previous radiation. YES

## 2015-06-09 NOTE — Telephone Encounter (Signed)
per pof to sch pt appt-sent MW email to sch ttmt-pt to get updated sch on 1/4

## 2015-06-09 NOTE — Telephone Encounter (Signed)
Dr. Benay Spice ordered a STAT CT of the Brain for today, 06/09/2015. Called central scheduling. They have scheduled the CT for 11:00am this morning.       [facility].

## 2015-06-09 NOTE — Progress Notes (Unsigned)
Pt scheduled for labs via [place]. Pt requested labs peripherally, tx is next day.

## 2015-06-09 NOTE — Progress Notes (Signed)
[facility] OFFICE PROGRESS NOTE   Diagnosis: Non-small cell lung cancer  INTERVAL HISTORY:   Christina Hale returns as scheduled. She continues every three-week Alimta/Avastin. No bleeding or symptom of thrombosis. She was last treated 05/18/2015.  She has developed increased right-sided weakness over the past 4-5 days. She has anorexia. The leg edema worsened when she resumed Megace.  Objective:  Vital signs in last 24 hours:  Blood pressure 130/62, pulse 115, temperature 97.8 F (36.6 C), temperature source Oral, resp. rate 18, height 5' 10"  (1.778 m), weight 167 lb 3.2 oz (75.841 kg), SpO2 100 %.    HEENT: No thrush or ulcers Resp: Lungs clear bilaterally Cardio: Regular rate and rhythm GI: No hepatomegaly Vascular: Pitting edema at the right lower arm and hand and right lower leg with chronic stasis change at the right lower leg Neuro: Alert and oriented, 3+/5 strength at the right arm and leg, she can ambulate with assistance  Skin: Erythema at the cheeks bilaterally    Portacath/PICC-without erythema  Lab Results:  Lab Results  Component Value Date   WBC 6.7 06/09/2015   HGB 8.7* 06/09/2015   HCT 27.0* 06/09/2015   MCV 98.4 06/09/2015   PLT 234 06/09/2015   NEUTROABS 5.3 06/09/2015     Medications: I have reviewed the patient's current medications.  Assessment/Plan: 1. Stage IV adenocarcinoma of the lung, dominant left upper lobe mass and spiculated right upper lobe mass  Status post a biopsy of a left hepatic mass confirming metastatic adenocarcinoma consistent with a lung primary, ALK mutation negative, EGFR amplification suboptimal-repeat testing recommend   Staging CT scans 09/27/2013 confirmed a dominant left upper lung mass, a smaller right upper lung mass, left hepatic mass, and left external iliac node.   Cycle 1 Taxol/carboplatin and Avastin per the [facility] P2951 study 10/09/2013.   Cycle 2 Taxol/carboplatin and Avastin 10/29/2013.    Restaging CT evaluation 11/14/2013 with improvement in the lungs and liver. Left external iliac lymph node was stable.   Cycle 3 Taxol/carboplatin and Avastin 12/02/2013   Restaging CT 01/02/2014 with a mixed response-slightly larger left upper lung mass and mediastinal lymph node, slightly smaller liver mass and iliac node   Maintenance Avastin beginning 01/06/2014   Restaging CT 03/06/2014 with a decrease in the size of the lung masses and a liver lesion  Restaging CT 05/22/2014 with a stable right upper lung nodule, decreased size of a left upper lobe lung mass, increased size of a liver lesion, stable left pelvic nodule/node.  Restaging CT 07/24/2014 with no significant change in the chest, increase in size of the mass within the left hepatic lobe, 2 retroperitoneal lymph nodes increased in size, stable appearance of peritoneal nodules.  brain/ skull metastases documented on an MRI of the brain/cervical spine 09/23/2014, taken off of the E5508 study  Restaging CTs 10/09/2014 -slight decrease in size of the left upper lobe cavitary mass , enlargement of the dominant left liver lesion, no significant change in the size of abdominal or chest lymph nodes except for enlargement of a subcarinal node  Cycle 1 Alimta/Avastin 10/20/2014  Cycle 2 Alimta/Avastin 11/10/2014  Cycle 3 Alimta/Avastin 12/01/2014  Cycle 4 Alimta/Avastin 12/22/2014  Cycle 5 Alimta/Avastin 01/12/2015  Cycle 6 Alimta/Avastin 02/02/2015  MRI brain 01/08/2015 with regression of all 5 brain metastases. 3 still visible. Bone metastases not significantly changed.  Restaging CTs 02/19/2015 with stable left upper lobe cavitary mass, stable right upper lobe lobular nodule, stable left hepatic lobe metastatic lesion. No evidence  of disease progression in the abdomen or pelvis.  Cycle 7 Alimta/Avastin 02/23/2015  Cycle 8 Alimta/Avastin 03/16/2015  Cycle 9 Alimta/Avastin 04/06/2015  Cycle 10 Alimta/Avastin  04/27/2015  Restaging CT of the chest abdomen and pelvis performed on 05/07/2015 showed an essentially stable left upper lobe paramediastinal mass. Potential new mild thickening in the mediastinal pleural surface in the anterior mediastinum left. Mild increase in subcarinal lymph node size. Prevascular lymph nodes are not changed. Stable right upper lobe pulmonary nodule. The round lesion in the left lateral hepatic lobe is slightly contraction side. No new hepatic lesions. Perineal nodular metastasis similar compared to CT of 10/09/2014 and 02/19/2015.  Cycle 11 Alimta/Avastin 05/18/2015 2. C. difficile colitis March 2015 3. Hyponatremia/seizure on hospital admission 08/29/2013 4. Chronic right hip "bursitis". 5. Nausea and vomiting following cycle 1 Taxol/carboplatin, the anti-emetic regimen was adjusted with cycle 2. 6. Severe neutropenia following cycle 1 Taxol/carboplatin-Neulasta added with cycle 2. 7. 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 9. Anemia. Likely secondary to chemotherapy, chronic disease, and malnutrition versus bone marrow metastases. She received red cell transfusions 12/20/2013 and 12/21/2013.  10. Hoarseness-improved, potentially related to toxicity from chemotherapy versus tumor. 11. Right retinal detachment 04/05/2014, status post surgical repair 04/07/2014 12. Low TSH with a borderline elevated T4- she was diagnosed with hyperthyroidism, status post radioactive iodine treatment on 08/21/2014 , started on thyroid hormone replacement by Dr. Forde Dandy 13. History of Proteinuria-mild 14. Decreased right nasolabial fold and right arm/leg weakness 09/22/2014  MRI brain/cervical spine 09/23/2014 revealed 5 brain metastases including a 3.1 cm brainstem metastasis in the left pons, 2 left posterior skull metastases, and a C1 metastasis  Decadron started 09/23/2014 a tapered to off  MRI brain 01/08/2015 with regression of all 5  brain metastases. 3 still visible. Bone metastases not significantly changed. 15. Neck pain, pain with swallowing-likely related to thyroid inflammation from I-131 therapy, improved with Decadron 16. Bilateral leg edema. Bilateral lower extremity venous Doppler negative 02/02/2015. Megace and Norvasc discontinued 02/23/2015. Leg edema markedly improved 03/16/2015. 17. Urine culture 02/23/2015 with Escherichia coli. She completed a course of ciprofloxacin. 18. Facial rash-the differential diagnosis includes acne, rosacea, a paraneoplastic rash, or rash secondary to a collagen vascular disorder   Disposition:  Christina Hale has metastatic non-small cell lung cancer. Her performance status has declined. She appears to have increased right-sided weakness today. She will be referred for a brain CT to look for evidence of progressive brain metastases. She is scheduled for another cycle of Alimta/Avastin tomorrow. We will decide on chemotherapy based on the CT from today.  Christina Hale will return for an office visit and chemotherapy in 3 weeks. We will see her sooner pending the CT result.  Betsy Coder, MD  06/09/2015  10:11 AM

## 2015-06-10 ENCOUNTER — Ambulatory Visit (HOSPITAL_BASED_OUTPATIENT_CLINIC_OR_DEPARTMENT_OTHER): Payer: BC Managed Care – PPO

## 2015-06-10 ENCOUNTER — Other Ambulatory Visit: Payer: Self-pay | Admitting: *Deleted

## 2015-06-10 VITALS — BP 150/72 | HR 108 | Temp 97.8°F | Resp 18

## 2015-06-10 DIAGNOSIS — Z5111 Encounter for antineoplastic chemotherapy: Secondary | ICD-10-CM | POA: Diagnosis not present

## 2015-06-10 DIAGNOSIS — C7931 Secondary malignant neoplasm of brain: Secondary | ICD-10-CM

## 2015-06-10 DIAGNOSIS — Z5112 Encounter for antineoplastic immunotherapy: Secondary | ICD-10-CM

## 2015-06-10 DIAGNOSIS — R300 Dysuria: Secondary | ICD-10-CM

## 2015-06-10 DIAGNOSIS — C7951 Secondary malignant neoplasm of bone: Secondary | ICD-10-CM

## 2015-06-10 DIAGNOSIS — C7989 Secondary malignant neoplasm of other specified sites: Secondary | ICD-10-CM

## 2015-06-10 DIAGNOSIS — C3412 Malignant neoplasm of upper lobe, left bronchus or lung: Secondary | ICD-10-CM

## 2015-06-10 LAB — URINALYSIS, MICROSCOPIC - CHCC
Glucose: NEGATIVE mg/dL
KETONES: NEGATIVE mg/dL
NITRITE: POSITIVE
PH: 6 (ref 4.6–8.0)
Protein: 30 mg/dL
Specific Gravity, Urine: 1.015 (ref 1.003–1.035)
Urobilinogen, UR: 0.2 mg/dL (ref 0.2–1)

## 2015-06-10 MED ORDER — SODIUM CHLORIDE 0.9 % IJ SOLN
10.0000 mL | INTRAMUSCULAR | Status: DC | PRN
  Administered 2015-06-10: 10 mL
  Filled 2015-06-10: qty 10

## 2015-06-10 MED ORDER — SODIUM CHLORIDE 0.9 % IV SOLN
15.0000 mg/kg | Freq: Once | INTRAVENOUS | Status: AC
  Administered 2015-06-10: 1200 mg via INTRAVENOUS
  Filled 2015-06-10: qty 48

## 2015-06-10 MED ORDER — CYANOCOBALAMIN 1000 MCG/ML IJ SOLN
1000.0000 ug | Freq: Once | INTRAMUSCULAR | Status: AC
  Administered 2015-06-10: 1000 ug via INTRAMUSCULAR

## 2015-06-10 MED ORDER — SODIUM CHLORIDE 0.9 % IV SOLN
Freq: Once | INTRAVENOUS | Status: AC
  Administered 2015-06-10: 09:00:00 via INTRAVENOUS
  Filled 2015-06-10: qty 4

## 2015-06-10 MED ORDER — SODIUM CHLORIDE 0.9 % IV SOLN
Freq: Once | INTRAVENOUS | Status: AC
  Administered 2015-06-10: 09:00:00 via INTRAVENOUS

## 2015-06-10 MED ORDER — SODIUM CHLORIDE 0.9 % IV SOLN: 500.0000 mg/m2 | Freq: Once | INTRAVENOUS | Status: DC

## 2015-06-10 MED ORDER — HEPARIN SOD (PORK) LOCK FLUSH 100 UNIT/ML IV SOLN
500.0000 [IU] | Freq: Once | INTRAVENOUS | Status: AC | PRN
  Administered 2015-06-10: 500 [IU]
  Filled 2015-06-10: qty 5

## 2015-06-10 MED ORDER — CIPROFLOXACIN HCL 500 MG PO TABS: 500.0000 mg | ORAL_TABLET | Freq: Two times a day (BID) | ORAL | Status: DC

## 2015-06-10 MED ORDER — SODIUM CHLORIDE 0.9 % IV SOLN
515.0000 mg/m2 | Freq: Once | INTRAVENOUS | Status: AC
  Administered 2015-06-10: 1000 mg via INTRAVENOUS
  Filled 2015-06-10: qty 40

## 2015-06-10 NOTE — Patient Instructions (Signed)
[facility] Discharge Instructions for Patients Receiving Chemotherapy  Today you received the following chemotherapy agents: Alimta and Avastin.  To help prevent nausea and vomiting after your treatment, we encourage you to take your nausea medication: Zofran 4 mg as directed.   If you develop nausea and vomiting that is not controlled by your nausea medication, call the clinic.   BELOW ARE SYMPTOMS THAT SHOULD BE REPORTED IMMEDIATELY:  *FEVER GREATER THAN 100.5 F  *CHILLS WITH OR WITHOUT FEVER  NAUSEA AND VOMITING THAT IS NOT CONTROLLED WITH YOUR NAUSEA MEDICATION  *UNUSUAL SHORTNESS OF BREATH  *UNUSUAL BRUISING OR BLEEDING  TENDERNESS IN MOUTH AND THROAT WITH OR WITHOUT PRESENCE OF ULCERS  *URINARY PROBLEMS  *BOWEL PROBLEMS  UNUSUAL RASH Items with * indicate a potential emergency and should be followed up as soon as possible.  Feel free to call the clinic you have any questions or concerns. The clinic phone number is [phone].  Please show the [facility] at check-in to the Emergency Department and triage nurse.

## 2015-06-10 NOTE — Progress Notes (Signed)
Per Dr. Benay Spice, ok to treat pt from yesterday's lab and current Ct results.  10AM- Pt c/o burning during urination for a few days now. Unable to provide sample yesterday. Urinalysis ordered and will obtain sample before pt leaves treatment today. 1045AM- Pt urinalysis sample obtained and will deliver to lab.

## 2015-06-10 NOTE — Telephone Encounter (Signed)
Called pt's husband with UA result. Begin Cipro BID per Dr. Benay Spice. Mr. Keepers voiced understanding, stated he would pick up Rx now.

## 2015-06-12 LAB — URINE CULTURE

## 2015-06-15 ENCOUNTER — Other Ambulatory Visit: Payer: Self-pay | Admitting: *Deleted

## 2015-06-15 MED ORDER — MIRTAZAPINE 15 MG PO TABS: 15.0000 mg | ORAL_TABLET | Freq: Every day | ORAL | Status: DC

## 2015-06-18 ENCOUNTER — Telehealth: Payer: Self-pay | Admitting: *Deleted

## 2015-06-18 NOTE — Telephone Encounter (Signed)
Received call from daughter Zadie Rhine requesting to talk to md.  Spoke with Mable Fill and was informed that pt has gotten weaker,  Eating very little,  Confusion worsened, and very limited mobility - unable to weight bearing much.   Pt was given antibiotics for UTI on 06/10/15.  Mable Fill was very concerned and would like to discuss pt's situation with Dr. Benay Spice. [facility]   Phone     [phone].

## 2015-06-20 ENCOUNTER — Encounter (HOSPITAL_COMMUNITY): Payer: Self-pay | Admitting: *Deleted

## 2015-06-20 ENCOUNTER — Emergency Department (HOSPITAL_COMMUNITY): Payer: BC Managed Care – PPO

## 2015-06-20 ENCOUNTER — Inpatient Hospital Stay (HOSPITAL_COMMUNITY)
Admission: EM | Admit: 2015-06-20 | Discharge: 2015-06-24 | DRG: 808 | Disposition: A | Discharge status: Home Health | Payer: BC Managed Care – PPO | Attending: Internal Medicine | Admitting: Internal Medicine

## 2015-06-20 DIAGNOSIS — D6959 Other secondary thrombocytopenia: Secondary | ICD-10-CM | POA: Diagnosis present

## 2015-06-20 DIAGNOSIS — Z91041 Radiographic dye allergy status: Secondary | ICD-10-CM

## 2015-06-20 DIAGNOSIS — Z6823 Body mass index (BMI) 23.0-23.9, adult: Secondary | ICD-10-CM

## 2015-06-20 DIAGNOSIS — C7951 Secondary malignant neoplasm of bone: Secondary | ICD-10-CM | POA: Diagnosis present

## 2015-06-20 DIAGNOSIS — C7931 Secondary malignant neoplasm of brain: Secondary | ICD-10-CM | POA: Diagnosis present

## 2015-06-20 DIAGNOSIS — D61818 Other pancytopenia: Secondary | ICD-10-CM | POA: Diagnosis not present

## 2015-06-20 DIAGNOSIS — C3412 Malignant neoplasm of upper lobe, left bronchus or lung: Secondary | ICD-10-CM | POA: Diagnosis present

## 2015-06-20 DIAGNOSIS — Z8249 Family history of ischemic heart disease and other diseases of the circulatory system: Secondary | ICD-10-CM

## 2015-06-20 DIAGNOSIS — D649 Anemia, unspecified: Secondary | ICD-10-CM | POA: Diagnosis not present

## 2015-06-20 DIAGNOSIS — Z87891 Personal history of nicotine dependence: Secondary | ICD-10-CM

## 2015-06-20 DIAGNOSIS — C787 Secondary malignant neoplasm of liver and intrahepatic bile duct: Secondary | ICD-10-CM | POA: Diagnosis present

## 2015-06-20 DIAGNOSIS — R21 Rash and other nonspecific skin eruption: Secondary | ICD-10-CM | POA: Diagnosis present

## 2015-06-20 DIAGNOSIS — R7303 Prediabetes: Secondary | ICD-10-CM | POA: Diagnosis present

## 2015-06-20 DIAGNOSIS — R627 Adult failure to thrive: Secondary | ICD-10-CM | POA: Diagnosis present

## 2015-06-20 DIAGNOSIS — I10 Essential (primary) hypertension: Secondary | ICD-10-CM | POA: Diagnosis present

## 2015-06-20 DIAGNOSIS — N320 Bladder-neck obstruction: Secondary | ICD-10-CM | POA: Diagnosis present

## 2015-06-20 DIAGNOSIS — R531 Weakness: Secondary | ICD-10-CM | POA: Diagnosis not present

## 2015-06-20 DIAGNOSIS — E559 Vitamin D deficiency, unspecified: Secondary | ICD-10-CM | POA: Diagnosis present

## 2015-06-20 DIAGNOSIS — E860 Dehydration: Secondary | ICD-10-CM | POA: Diagnosis not present

## 2015-06-20 DIAGNOSIS — R29810 Facial weakness: Secondary | ICD-10-CM | POA: Diagnosis present

## 2015-06-20 DIAGNOSIS — D6481 Anemia due to antineoplastic chemotherapy: Secondary | ICD-10-CM | POA: Diagnosis present

## 2015-06-20 DIAGNOSIS — C3410 Malignant neoplasm of upper lobe, unspecified bronchus or lung: Secondary | ICD-10-CM | POA: Diagnosis present

## 2015-06-20 DIAGNOSIS — Z79899 Other long term (current) drug therapy: Secondary | ICD-10-CM

## 2015-06-20 DIAGNOSIS — Z90710 Acquired absence of both cervix and uterus: Secondary | ICD-10-CM

## 2015-06-20 DIAGNOSIS — L271 Localized skin eruption due to drugs and medicaments taken internally: Secondary | ICD-10-CM | POA: Diagnosis present

## 2015-06-20 DIAGNOSIS — E46 Unspecified protein-calorie malnutrition: Secondary | ICD-10-CM | POA: Diagnosis present

## 2015-06-20 DIAGNOSIS — D638 Anemia in other chronic diseases classified elsewhere: Secondary | ICD-10-CM | POA: Diagnosis present

## 2015-06-20 DIAGNOSIS — R490 Dysphonia: Secondary | ICD-10-CM | POA: Diagnosis present

## 2015-06-20 DIAGNOSIS — M79601 Pain in right arm: Secondary | ICD-10-CM | POA: Diagnosis present

## 2015-06-20 DIAGNOSIS — R410 Disorientation, unspecified: Secondary | ICD-10-CM

## 2015-06-20 DIAGNOSIS — R000 Tachycardia, unspecified: Secondary | ICD-10-CM

## 2015-06-20 DIAGNOSIS — F419 Anxiety disorder, unspecified: Secondary | ICD-10-CM | POA: Diagnosis present

## 2015-06-20 DIAGNOSIS — Z888 Allergy status to other drugs, medicaments and biological substances status: Secondary | ICD-10-CM

## 2015-06-20 DIAGNOSIS — T1490XA Injury, unspecified, initial encounter: Secondary | ICD-10-CM

## 2015-06-20 DIAGNOSIS — G622 Polyneuropathy due to other toxic agents: Secondary | ICD-10-CM | POA: Diagnosis present

## 2015-06-20 DIAGNOSIS — E0590 Thyrotoxicosis, unspecified without thyrotoxic crisis or storm: Secondary | ICD-10-CM | POA: Diagnosis present

## 2015-06-20 DIAGNOSIS — T451X5A Adverse effect of antineoplastic and immunosuppressive drugs, initial encounter: Secondary | ICD-10-CM | POA: Diagnosis present

## 2015-06-20 DIAGNOSIS — E876 Hypokalemia: Secondary | ICD-10-CM | POA: Diagnosis present

## 2015-06-20 DIAGNOSIS — G9340 Encephalopathy, unspecified: Secondary | ICD-10-CM | POA: Diagnosis present

## 2015-06-20 DIAGNOSIS — Z885 Allergy status to narcotic agent status: Secondary | ICD-10-CM

## 2015-06-20 DIAGNOSIS — N390 Urinary tract infection, site not specified: Secondary | ICD-10-CM | POA: Diagnosis present

## 2015-06-20 DIAGNOSIS — C3411 Malignant neoplasm of upper lobe, right bronchus or lung: Secondary | ICD-10-CM | POA: Diagnosis present

## 2015-06-20 DIAGNOSIS — E440 Moderate protein-calorie malnutrition: Secondary | ICD-10-CM | POA: Insufficient documentation

## 2015-06-20 DIAGNOSIS — C3490 Malignant neoplasm of unspecified part of unspecified bronchus or lung: Secondary | ICD-10-CM

## 2015-06-20 DIAGNOSIS — Z66 Do not resuscitate: Secondary | ICD-10-CM | POA: Diagnosis present

## 2015-06-20 DIAGNOSIS — E785 Hyperlipidemia, unspecified: Secondary | ICD-10-CM | POA: Diagnosis present

## 2015-06-20 LAB — CBC WITH DIFFERENTIAL/PLATELET
Basophils Absolute: 0 10*3/uL (ref 0.0–0.1)
Basophils Relative: 0 %
Eosinophils Absolute: 0 10*3/uL (ref 0.0–0.7)
Eosinophils Relative: 1 %
HEMATOCRIT: 23.1 % — AB (ref 36.0–46.0)
HEMOGLOBIN: 7.2 g/dL — AB (ref 12.0–15.0)
LYMPHS ABS: 0.5 10*3/uL — AB (ref 0.7–4.0)
LYMPHS PCT: 13 %
MCH: 31.4 pg (ref 26.0–34.0)
MCHC: 31.2 g/dL (ref 30.0–36.0)
MCV: 100.9 fL — ABNORMAL HIGH (ref 78.0–100.0)
MONO ABS: 0.6 10*3/uL (ref 0.1–1.0)
MONOS PCT: 13 %
NEUTROS ABS: 3 10*3/uL (ref 1.7–7.7)
NEUTROS PCT: 73 %
Platelets: 61 10*3/uL — ABNORMAL LOW (ref 150–400)
RBC: 2.29 MIL/uL — ABNORMAL LOW (ref 3.87–5.11)
RDW: 20.9 % — AB (ref 11.5–15.5)
WBC: 4.2 10*3/uL (ref 4.0–10.5)

## 2015-06-20 LAB — BASIC METABOLIC PANEL
Anion gap: 9 (ref 5–15)
BUN: 12 mg/dL (ref 6–20)
CHLORIDE: 102 mmol/L (ref 101–111)
CO2: 24 mmol/L (ref 22–32)
Calcium: 7.9 mg/dL — ABNORMAL LOW (ref 8.9–10.3)
Creatinine, Ser: 0.74 mg/dL (ref 0.44–1.00)
GFR calc Af Amer: >60 mL/min (ref >60)
GLUCOSE: 152 mg/dL — AB (ref 65–99)
Potassium: 3.5 mmol/L (ref 3.5–5.1)
Sodium: 135 mmol/L (ref 135–145)

## 2015-06-20 LAB — HEPATIC FUNCTION PANEL
ALBUMIN: 1.7 g/dL — AB (ref 3.5–5.0)
ALT: 9 U/L — ABNORMAL LOW (ref 14–54)
AST: 20 U/L (ref 15–41)
Alkaline Phosphatase: 71 U/L (ref 38–126)
Bilirubin, Direct: 0.1 mg/dL (ref 0.1–0.5)
Indirect Bilirubin: 0.4 mg/dL (ref 0.3–0.9)
TOTAL PROTEIN: 6.1 g/dL — AB (ref 6.5–8.1)
Total Bilirubin: 0.5 mg/dL (ref 0.3–1.2)

## 2015-06-20 LAB — URINALYSIS, ROUTINE W REFLEX MICROSCOPIC
Bilirubin Urine: NEGATIVE
GLUCOSE, UA: NEGATIVE mg/dL
Ketones, ur: NEGATIVE mg/dL
Leukocytes, UA: NEGATIVE
Nitrite: NEGATIVE
PH: 6 (ref 5.0–8.0)
PROTEIN: NEGATIVE mg/dL
Specific Gravity, Urine: 1.019 (ref 1.005–1.030)

## 2015-06-20 LAB — PROTIME-INR
INR: 1.26 (ref 0.00–1.49)
PROTHROMBIN TIME: 15.9 s — AB (ref 11.6–15.2)

## 2015-06-20 LAB — URINE MICROSCOPIC-ADD ON

## 2015-06-20 LAB — I-STAT CG4 LACTIC ACID, ED: Lactic Acid, Venous: 2.27 mmol/L (ref 0.5–2.0)

## 2015-06-20 LAB — AMMONIA: AMMONIA: 11 umol/L (ref 9–35)

## 2015-06-20 MED ORDER — SODIUM CHLORIDE 0.9 % IV BOLUS (SEPSIS)
1000.0000 mL | Freq: Once | INTRAVENOUS | Status: AC
  Administered 2015-06-20: 1000 mL via INTRAVENOUS

## 2015-06-20 MED ORDER — SODIUM CHLORIDE 0.9 % IV SOLN
Freq: Once | INTRAVENOUS | Status: AC
  Administered 2015-06-20: 22:00:00 via INTRAVENOUS

## 2015-06-20 MED ORDER — ACETAMINOPHEN 500 MG PO TABS
1000.0000 mg | ORAL_TABLET | Freq: Once | ORAL | Status: AC
  Administered 2015-06-20: 1000 mg via ORAL
  Filled 2015-06-20: qty 2

## 2015-06-20 NOTE — ED Notes (Signed)
Pt is lung cancer patient, last chemo infusion was a week ago Wed. Pt spouse reports pt is dehydrated, not eating and her lab levels were low.

## 2015-06-20 NOTE — ED Notes (Signed)
Pt c/o of headache.  Per pt's husband, pt's has episodes of emesis when she has a headache.  RN Cyril Mourning notified

## 2015-06-20 NOTE — H&P (Addendum)
PCP: Sheela Stack, MD  Oncologist Dr. Benay Spice  Referring provider Dr. Jeneen Rinks   Chief Complaint:   Generalized fatigue and weakness  HPI: Christina Hale is a 65 y.o. female   has a past medical history of Hyperlipidemia; Hypertension; Prediabetes; Vitamin D deficiency; Anxiety; Lung cancer ([place]); and Brain cancer ([place]).   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 She is on every three-week Alimata/Avastin treatments. Sweets patient developed worsening right-sided weakness and undergone CT scan of the brain on the fifth third of January. She has  history of 3 brain metastases. CT scan did not show any new disease. She was diagnosed with possible UTI and given antibiotics. The past 2 days has been getting progressively weaker she hasn't been eating anything she has been more confused than her baseline although this is partially improved today. She is unable to bear weight.  Her husband had to carry her around the house. He reports for the past week his been given her Remeron during the day instead of at night time as was advised by pharmacist. She also sometimes takes an extra as needed but that has been going on for quite some time. Otherwise no other medication changes Last chemotherapy treatment was on January 3. Examination denies any melena no blood in stools no bleeding but she does bruise easily In emergency department she was found to have hemoglobin of 7.2 white cell count of 4.2 with absolute neutrophil count of  3.0 and platelet count of 61. This compares to labs obtained on January 3 at that point her hemoglobin was 8.7 and her platelet count was 234 She denies any  Bleeding. Repeat UA showed no evidence of UTI. X-ray showing persistent malignancy no new lung lesions known   Of note in March 2015 patient did have C. 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 She has required transfusions in the past. Her baseline hemoglobin is 10. Of note patient has history of chronic leg edema she have had negative Dopplers in the past last one was in August 2016 this was thought to be secondary to Megace and Norvasc and improved after its discontinuation.  Hospitalist was called for admission for dehydration and symptomatic anemia  Review of Systems:    Pertinent positives include: Fatigue, decreased by mouth intake, easy bruising,  Constitutional:  No weight loss, night sweats, Fevers, chills,  weight loss  HEENT:  No headaches, Difficulty swallowing,Tooth/dental problems,Sore throat,  No sneezing, itching, ear ache, nasal congestion, post nasal drip,  Cardio-vascular:  No chest pain, Orthopnea, PND, anasarca, dizziness, palpitations.no Bilateral lower extremity swelling  GI:  No heartburn, indigestion, abdominal pain, nausea, vomiting, diarrhea, change in bowel habits, loss of appetite, melena, blood in stool, hematemesis Resp:  no shortness of breath at rest. No dyspnea on exertion, No excess mucus, no productive cough, No non-productive cough, No coughing up of blood.No change in color of mucus.No wheezing. Skin:  no rash or lesions. No jaundice GU:  no dysuria, change in color of urine, no urgency or frequency. No straining to urinate.  No flank pain.  Musculoskeletal:  No joint pain or no joint swelling. No decreased range of motion. No back pain.  Psych:  No change in mood  or affect. No depression or anxiety. No memory loss.  Neuro: no localizing neurological complaints, no tingling, no weakness, no double vision, no gait abnormality, no slurred speech, no confusion  Otherwise ROS are negative except for above, 10 systems were reviewed  Past Medical  History: Past Medical History  Diagnosis Date  . Hyperlipidemia   . Hypertension   . Prediabetes   . Vitamin D deficiency   . Anxiety   . Lung cancer ([place])   . Brain cancer ([place])     brain mets metastatic lung ca   Past Surgical History  Procedure Laterality Date  . Lasik Bilateral 2002  . Carpal tunnel release Right 2000  . Abdominal hysterectomy  1999  . Cesarean section      x2     Medications: Prior to Admission medications   Medication Sig Start Date End Date Taking? Authorizing Provider  albuterol (PROVENTIL HFA;VENTOLIN HFA) 108 (90 BASE) MCG/ACT inhaler Inhale 1-2 puffs into the lungs every 6 (six) hours as needed for wheezing or shortness of breath. 03/02/15  Yes Susanne Borders, NP  ALPRAZolam Duanne Moron) 0.25 MG tablet Take 1 tablet (0.25 mg total) by mouth 3 (three) times daily as needed for anxiety. Patient taking differently: Take 0.25 mg by mouth daily.  04/27/15  Yes Maryanna Shape, NP  ALPRAZolam Duanne Moron) 1 MG tablet Take 1 mg by mouth 3 (three) times daily as needed for anxiety.  04/29/15  Yes Historical Provider, MD  Alum & Mag Hydroxide-Simeth (MAGIC MOUTHWASH W/LIDOCAINE) SOLN Take 5 mLs by mouth 4 (four) times daily as needed for mouth pain (swish for 1 minute, then spit). 12/25/13  Yes Ladell Pier, MD  clindamycin (CLINDAGEL) 1 % gel Apply topically 2 (two) times daily. 05/18/15  Yes Maryanna Shape, NP  folic acid (FOLVITE) 1 MG tablet TAKE 1 TABLET (1 MG TOTAL) BY MOUTH DAILY. 03/16/15  Yes Owens Shark, NP  mirtazapine (REMERON) 15 MG tablet Take 1 tablet (15 mg total) by mouth at bedtime. 06/15/15  Yes Ladell Pier, MD  Multiple Vitamin (MULTIVITAMIN WITH MINERALS) TABS tablet Take 1 tablet by mouth daily.   Yes Historical Provider, MD  ondansetron (ZOFRAN) 4 MG tablet Take 1 tablet (4 mg total) by mouth daily as needed for nausea or vomiting. 09/11/14 09/11/15 Yes Courtney Forcucci, PA-C  senna-docusate (SENNA S) 8.6-50 MG per tablet Take 1 tablet by mouth  daily. 05/05/14  Yes Ladell Pier, MD  SYNTHROID 150 MCG tablet TAKE 1 TABLET BY MOUTH DAILY EXCEPT TAKE 1 & 1/2 TABLETS BY MOUTH ON SUNDAY 03/19/15  Yes Historical Provider, MD  traMADol (ULTRAM) 50 MG tablet Take 1-2 tablets (50-100 mg total) by mouth every 8 (eight) hours as needed. Patient taking differently: Take 50-100 mg by mouth every 8 (eight) hours as needed for moderate pain or severe pain.  06/09/15  Yes Ladell Pier, MD  ciprofloxacin (CIPRO) 500 MG tablet Take 1 tablet (500 mg total) by mouth 2 (two) times daily. Patient not taking: Reported on 06/20/2015 06/10/15   Ladell Pier, MD  megestrol (MEGACE) 40 MG/ML suspension TAKE 5 MLS (200 MG TOTAL) BY MOUTH 2 (TWO) TIMES DAILY. Patient not taking: Reported on 06/20/2015 04/27/15   Maryanna Shape, NP    Allergies:   Allergies  Allergen Reactions  . Contrast Media [Iodinated Diagnostic Agents] Other (See Comments)    Patient stated," I don't remember what kind of reaction I had, it was a long time ago. I  was told not to take it again."  . Codeine Nausea Only  . Prednisolone Nausea Only    Social History:  Ambulatory lately unable to ambulate Lives at home With family     reports that she quit smoking about 21 months ago. Her smoking use included Cigarettes. She has a 40 pack-year smoking history. She has never used smokeless tobacco. She reports that she does not drink alcohol or use illicit drugs.    Family History: family history includes Heart attack in her mother; Heart disease in her mother; Hypertension in her mother; Pneumonia in her father.    Physical Exam: Patient Vitals for the past 24 hrs:  BP Temp Temp src Pulse Resp SpO2 Height Weight  06/20/15 2311 144/61 mmHg - - 84 16 98 % - -  06/20/15 2310 144/61 mmHg - - 110 - 97 % - -  06/20/15 2115 - - - 107 23 99 % - -  06/20/15 2100 137/72 mmHg - - - 21 - - -  06/20/15 2036 140/70 mmHg - - - - - - -  06/20/15 1930 142/78 mmHg - - (!) 122 16 99 % - -    06/20/15 1924 145/77 mmHg - - (!) 127 21 99 % '5\' 10"'$  (1.778 m) 75.751 kg (167 lb)  06/20/15 1906 136/80 mmHg 97.4 F (36.3 C) Oral (!) 146 23 100 % - -    1. General:  in No Acute distress 2. Psychological: Alert and  Oriented 3. Head/ENT:    Dry Mucous Membranes                          Head Non traumatic, neck supple                            Poor Dentition 4. SKIN:   decreased Skin turgor,  Skin clean Dry and intact no rash 5. Heart: Regular rate and rhythm no Murmur, Rub or gallop 6. Lungs:   no wheezes some crackles   7. Abdomen: Soft, non-tender, Non distended 8. Lower extremities: no clubbing, cyanosis,  Edema chronic of the right lower and upper extremities 9. Neurologically Grossly intact, moving all 4 extremities equally 10. MSK: Normal range of motion  body mass index is 23.96 kg/(m^2).   Labs on Admission:   Results for orders placed or performed during the hospital encounter of 06/20/15 (from the past 24 hour(s))  CBC with Differential/Platelet     Status: Abnormal   Collection Time: 06/20/15  8:25 PM  Result Value Ref Range   WBC 4.2 4.0 - 10.5 K/uL   RBC 2.29 (L) 3.87 - 5.11 MIL/uL   Hemoglobin 7.2 (L) 12.0 - 15.0 g/dL   HCT 23.1 (L) 36.0 - 46.0 %   MCV 100.9 (H) 78.0 - 100.0 fL   MCH 31.4 26.0 - 34.0 pg   MCHC 31.2 30.0 - 36.0 g/dL   RDW 20.9 (H) 11.5 - 15.5 %   Platelets 61 (L) 150 - 400 K/uL   Neutrophils Relative % 73 %   Neutro Abs 3.0 1.7 - 7.7 K/uL   Lymphocytes Relative 13 %   Lymphs Abs 0.5 (L) 0.7 - 4.0 K/uL   Monocytes Relative 13 %   Monocytes Absolute 0.6 0.1 - 1.0 K/uL   Eosinophils Relative 1 %   Eosinophils Absolute 0.0 0.0 - 0.7 K/uL   Basophils Relative 0 %   Basophils Absolute 0.0  0.0 - 0.1 K/uL  Basic metabolic panel     Status: Abnormal   Collection Time: 06/20/15  8:25 PM  Result Value Ref Range   Sodium 135 135 - 145 mmol/L   Potassium 3.5 3.5 - 5.1 mmol/L   Chloride 102 101 - 111 mmol/L   CO2 24 22 - 32 mmol/L   Glucose,  Bld 152 (H) 65 - 99 mg/dL   BUN 12 6 - 20 mg/dL   Creatinine, Ser 0.74 0.44 - 1.00 mg/dL   Calcium 7.9 (L) 8.9 - 10.3 mg/dL   GFR calc non Af Amer >60 >60 mL/min   GFR calc Af Amer >60 >60 mL/min   Anion gap 9 5 - 15  I-Stat CG4 Lactic Acid, ED     Status: Abnormal   Collection Time: 06/20/15  8:41 PM  Result Value Ref Range   Lactic Acid, Venous 2.27 (HH) 0.5 - 2.0 mmol/L   Comment NOTIFIED PHYSICIAN   Urinalysis, Routine w reflex microscopic (not at [facility])     Status: Abnormal   Collection Time: 06/20/15  9:15 PM  Result Value Ref Range   Color, Urine AMBER (A) YELLOW   APPearance CLEAR CLEAR   Specific Gravity, Urine 1.019 1.005 - 1.030   pH 6.0 5.0 - 8.0   Glucose, UA NEGATIVE NEGATIVE mg/dL   Hgb urine dipstick TRACE (A) NEGATIVE   Bilirubin Urine NEGATIVE NEGATIVE   Ketones, ur NEGATIVE NEGATIVE mg/dL   Protein, ur NEGATIVE NEGATIVE mg/dL   Nitrite NEGATIVE NEGATIVE   Leukocytes, UA NEGATIVE NEGATIVE  Urine microscopic-add on     Status: Abnormal   Collection Time: 06/20/15  9:15 PM  Result Value Ref Range   Squamous Epithelial / LPF 0-5 (A) NONE SEEN   WBC, UA 0-5 0 - 5 WBC/hpf   RBC / HPF 0-5 0 - 5 RBC/hpf   Bacteria, UA RARE (A) NONE SEEN   Urine-Other MUCOUS PRESENT     UA no evidence of UTI  Lab Results  Component Value Date   HGBA1C 5.8* 10/02/2014    Estimated Creatinine Clearance: 76.8 mL/min (by C-G formula based on Cr of 0.74).  BNP (last 3 results) No results for input(s): PROBNP in the last 8760 hours.  Other results:  I have pearsonaly reviewed this: ECG REPORT  Rate: 121  Rhythm: Sinus tachycardia, ST&T Change: ST depression in multiple leads including inferior lateral QTC 421  Filed Weights   06/20/15 1924  Weight: 75.751 kg (167 lb)     Cultures:    Component Value Date/Time   SDES PERIRECTAL 08/31/2013 0715   SPECREQUEST Normal 08/31/2013 0715   CULT  08/31/2013 0715    NO SALMONELLA, SHIGELLA, CAMPYLOBACTER, YERSINIA, OR  E.COLI 0157:H7 ISOLATED Performed at [facility] 09/04/2013 FINAL 08/31/2013 0715     Radiological Exams on Admission: Dg Chest Port 1 View  06/20/2015  CLINICAL DATA:  Lung carcinoma.  Confusion.  Hypertension. EXAM: PORTABLE CHEST 1 VIEW COMPARISON:  Chest radiograph March 02, 2015. Chest CT May 07, 2015 FINDINGS: There is a spiculated lesion in the right upper lobe, essentially stable. There is a stable cavitary lesion in the right upper lobe medially extending into the right apex, unchanged. There is a minimal left pleural effusion, not present previously. No new parenchymal lung opacity. Heart size is unremarkable. The pulmonary vascularity is stable and within normal limits given the underlying left upper lobe lesion. Port-A-Cath tip is in the superior vena cava. No pneumothorax. No  adenopathy is apparent radiographically. IMPRESSION: Bilateral upper lobe lesions, much larger on the left than on the right, stable. The left upper lobe lesion has an area of cavitation, stable. No new parenchymal lung lesion identified. There is a new rather minimal left pleural effusion. No change in cardiac silhouette. No pneumothorax. Electronically Signed   By: Lowella Grip III M.D.   On: 06/20/2015 20:15    Chart has been reviewed  Family  at  Bedside  plan of care was discussed with Husband Simranjit Thayer [phone]  Assessment/Plan  65 year old female with history of lung cancer metastatic this is to the brain and liver presents with fatigue evidence of dehydration and symptomatic anemia with hypoalbuminemia secondary to malnutrition  Present on Admission:   confusion and debility - likely multifactorial will hold Remeron as this likely contributed to her increased confusion. At this point she does not appear to be confused at all. No asterixis present. We'll obtain ammonia. We'll rehydrate. Will need PT OT evaluation  . Hypertension , stable currently not on any  medication  . Lung cancer, upper lobe (08/2013) Metastatic to Liver need to let oncology note patient is being admitted  . Symptomatic anemia transfuse 1 unit, obtain anemia panel, Hemoccult stool this is most likely secondary to chemotherapy she sensitive received. Need  to notify oncology in the morning  . Dehydration administer IV fluids gently patient has significant hypoalbuminemia  . Pancytopenia ([place]) likely secondary to chemotherapy we'll follow blood counts Malnutrition will obtain prealbumin and nutritional consult PT OT evaluation for debility Prophylaxis: SCD  CODE STATUS:    DNR/DNI as per patient   Disposition:   To home once workup is complete and patient is stable but may need placement if does not recover rapidly  Other plan as per orders.  I have spent a total of 51mn on this admission extra time was taken to discuss code status and goals of care  [place]1/15/2017, 12:34 AM    [facility]  Pager [phone]  after 2 AM please page floor coverage PA If 7AM-7PM, please contact the day team taking care of the patient  [email or web address]  Password [email or web address]

## 2015-06-20 NOTE — ED Provider Notes (Signed)
CSN: [id number]     Arrival date & time 06/20/15  1901 History   First MD Initiated Contact with Patient 06/20/15 1929     Chief Complaint  Patient presents with  . Tachycardia     HPI  Patient presents accompanied by family from home. They're concerned because she is becoming weaker. His been intermittently confused. Eating poorly. They feel she is dehydrated. She is undergoing chemotherapy for lung cancer. Symptoms progressed the point the patient was unable to walk unassisted at home today. The patient had to be carried by her husband around the house, out to the car, and to the emergency room.  She had a Head CT scan  c/sabout 10 days ago. Did not show recurrence of a CNS lesion. Had a previous CNS metastasis treated with radiation.  Had labs obtained and given her chemotherapy last Wednesday, 10 days ago. Diagnosed with urinary tract infection. Completed 10 days of antibiotics. Family states that she is eating and drinking less. More more confused, and markedly more weak at home and they present her here. She's not been febrile. No diarrhea. Occasional vomiting and nausea. No shortness of breath. .  Past Medical History  Diagnosis Date  . Hyperlipidemia   . Hypertension   . Prediabetes   . Vitamin D deficiency   . Anxiety   . Lung cancer ([place])   . Brain cancer ([place])     brain mets metastatic lung ca   Past Surgical History  Procedure Laterality Date  . Lasik Bilateral 2002  . Carpal tunnel release Right 2000  . Abdominal hysterectomy  1999  . Cesarean section      x2   Family History  Problem Relation Age of Onset  . Heart disease Mother   . Hypertension Mother   . Heart attack Mother   . Pneumonia Father    Social History  Substance Use Topics  . Smoking status: Former Smoker -- 1.00 packs/day for 40 years    Types: Cigarettes    Quit date: 08/30/2013  . Smokeless tobacco: Never Used  . Alcohol Use: No   OB History    No data available     Review of Systems   Constitutional: Positive for activity change, appetite change and fatigue. Negative for fever, chills and diaphoresis.  HENT: Negative for mouth sores, sore throat and trouble swallowing.   Eyes: Negative for visual disturbance.  Respiratory: Negative for cough, chest tightness, shortness of breath and wheezing.   Cardiovascular: Negative for chest pain.  Gastrointestinal: Positive for nausea and vomiting. Negative for abdominal pain, diarrhea and abdominal distention.  Endocrine: Negative for polydipsia, polyphagia and polyuria.  Genitourinary: Negative for dysuria, frequency and hematuria.  Musculoskeletal: Negative for gait problem.  Skin: Negative for color change, pallor and rash.  Neurological: Positive for weakness. Negative for dizziness, syncope, light-headedness and headaches.  Hematological: Does not bruise/bleed easily.  Psychiatric/Behavioral: Positive for confusion. Negative for behavioral problems.      Allergies  Contrast media; Codeine; and Prednisolone  Home Medications   Prior to Admission medications   Medication Sig Start Date End Date Taking? Authorizing Provider  albuterol (PROVENTIL HFA;VENTOLIN HFA) 108 (90 BASE) MCG/ACT inhaler Inhale 1-2 puffs into the lungs every 6 (six) hours as needed for wheezing or shortness of breath. 03/02/15   Susanne Borders, NP  ALPRAZolam Duanne Moron) 0.25 MG tablet Take 1 tablet (0.25 mg total) by mouth 3 (three) times daily as needed for anxiety. 04/27/15   Maryanna Shape, NP  Alum & Mag Hydroxide-Simeth (MAGIC MOUTHWASH W/LIDOCAINE) SOLN Take 5 mLs by mouth 4 (four) times daily as needed for mouth pain (swish for 1 minute, then spit). 12/25/13   Ladell Pier, MD  ciprofloxacin (CIPRO) 500 MG tablet Take 1 tablet (500 mg total) by mouth 2 (two) times daily. 06/10/15   Ladell Pier, MD  clindamycin (CLINDAGEL) 1 % gel Apply topically 2 (two) times daily. 05/18/15   Maryanna Shape, NP  folic acid (FOLVITE) 1 MG tablet TAKE 1  TABLET (1 MG TOTAL) BY MOUTH DAILY. 03/16/15   Owens Shark, NP  megestrol (MEGACE) 40 MG/ML suspension TAKE 5 MLS (200 MG TOTAL) BY MOUTH 2 (TWO) TIMES DAILY. 04/27/15   Maryanna Shape, NP  mirtazapine (REMERON) 15 MG tablet Take 1 tablet (15 mg total) by mouth at bedtime. 06/15/15   Ladell Pier, MD  Multiple Vitamin (MULTIVITAMIN WITH MINERALS) TABS tablet Take 1 tablet by mouth daily.    Historical Provider, MD  ondansetron (ZOFRAN) 4 MG tablet Take 1 tablet (4 mg total) by mouth daily as needed for nausea or vomiting. 09/11/14 09/11/15  Courtney Forcucci, PA-C  senna-docusate (SENNA S) 8.6-50 MG per tablet Take 1 tablet by mouth daily. 05/05/14   Ladell Pier, MD  SYNTHROID 150 MCG tablet TAKE ONE TABLET BY MOUTH X 6 DAYS AND 1 1/2 TABLETS ON DAY 7. 03/19/15   Historical Provider, MD  traMADol (ULTRAM) 50 MG tablet Take 1-2 tablets (50-100 mg total) by mouth every 8 (eight) hours as needed. 06/09/15   Ladell Pier, MD   BP 142/78 mmHg  Pulse 122  Temp(Src) 97.4 F (36.3 C) (Oral)  Resp 16  Ht '5\' 10"'$  (1.778 m)  Wt 167 lb (75.751 kg)  BMI 23.96 kg/m2  SpO2 99% Physical Exam  Constitutional: She is oriented to person, place, and time. She appears well-developed and well-nourished. She appears listless. She has a sickly appearance. No distress.  HENT:  Head: Normocephalic.  Eyes: Conjunctivae are normal. Pupils are equal, round, and reactive to light. No scleral icterus.  Neck: Normal range of motion. Neck supple. No thyromegaly present.  Cardiovascular: Normal rate and regular rhythm.  Exam reveals no gallop and no friction rub.   No murmur heard. Sinus Tachycardia  Pulmonary/Chest: Effort normal and breath sounds normal. No respiratory distress. She has no wheezes. She has no rales.  Abdominal: Soft. Bowel sounds are normal. She exhibits no distension. There is no tenderness. There is no rebound.  Musculoskeletal: Normal range of motion.  Neurological: She is oriented to person,  place, and time. She appears listless.  Has weakness to the right leg versus left. Family states this is at baseline for her after her previous brain CNS lesion and treatment  Skin: Skin is warm and dry. No rash noted.  Psychiatric: She has a normal mood and affect. Her behavior is normal.    ED Course  Procedures (including critical care time) Labs Review Labs Reviewed  URINE CULTURE  CBC WITH DIFFERENTIAL/PLATELET  BASIC METABOLIC PANEL  URINALYSIS, ROUTINE W REFLEX MICROSCOPIC (NOT AT [facility])  I-STAT CG4 LACTIC ACID, ED    Imaging Review Dg Chest Port 1 View  06/20/2015  CLINICAL DATA:  Lung carcinoma.  Confusion.  Hypertension. EXAM: PORTABLE CHEST 1 VIEW COMPARISON:  Chest radiograph March 02, 2015. Chest CT May 07, 2015 FINDINGS: There is a spiculated lesion in the right upper lobe, essentially stable. There is a stable cavitary lesion in the right upper  lobe medially extending into the right apex, unchanged. There is a minimal left pleural effusion, not present previously. No new parenchymal lung opacity. Heart size is unremarkable. The pulmonary vascularity is stable and within normal limits given the underlying left upper lobe lesion. Port-A-Cath tip is in the superior vena cava. No pneumothorax. No adenopathy is apparent radiographically. IMPRESSION: Bilateral upper lobe lesions, much larger on the left than on the right, stable. The left upper lobe lesion has an area of cavitation, stable. No new parenchymal lung lesion identified. There is a new rather minimal left pleural effusion. No change in cardiac silhouette. No pneumothorax. Electronically Signed   By: Lowella Grip III M.D.   On: 06/20/2015 20:15   I have personally reviewed and evaluated these images and lab results as part of my medical decision-making.   EKG Interpretation None      MDM   Final diagnoses:  Confusion  Lung cancer [facility])    Patient given 1500 mL IV fluid. Urine has normalized after  recent infection. Still tachycardic at 110. Mental status shows her to be oriented lucid. Still complaining of marked weakness.  I don't feel her confusion represents obvious CNS lesion expansion. Did have recent CT with contrast that did not show evidence lesion. Hemoglobin has progressively fallen over the last 1 month from 10, 10 8, to 7.2 today.  Remains markedly weak.  Will admit for Transfusion.     Tanna Furry, MD 06/24/15 [phone]

## 2015-06-21 DIAGNOSIS — R21 Rash and other nonspecific skin eruption: Secondary | ICD-10-CM | POA: Diagnosis present

## 2015-06-21 DIAGNOSIS — C7931 Secondary malignant neoplasm of brain: Secondary | ICD-10-CM | POA: Diagnosis present

## 2015-06-21 DIAGNOSIS — D638 Anemia in other chronic diseases classified elsewhere: Secondary | ICD-10-CM | POA: Diagnosis present

## 2015-06-21 DIAGNOSIS — R410 Disorientation, unspecified: Secondary | ICD-10-CM | POA: Diagnosis not present

## 2015-06-21 DIAGNOSIS — T451X5A Adverse effect of antineoplastic and immunosuppressive drugs, initial encounter: Secondary | ICD-10-CM | POA: Diagnosis present

## 2015-06-21 DIAGNOSIS — E46 Unspecified protein-calorie malnutrition: Secondary | ICD-10-CM | POA: Diagnosis present

## 2015-06-21 DIAGNOSIS — R627 Adult failure to thrive: Secondary | ICD-10-CM | POA: Diagnosis present

## 2015-06-21 DIAGNOSIS — C3410 Malignant neoplasm of upper lobe, unspecified bronchus or lung: Secondary | ICD-10-CM

## 2015-06-21 DIAGNOSIS — E876 Hypokalemia: Secondary | ICD-10-CM | POA: Diagnosis present

## 2015-06-21 DIAGNOSIS — Z90710 Acquired absence of both cervix and uterus: Secondary | ICD-10-CM | POA: Diagnosis not present

## 2015-06-21 DIAGNOSIS — E440 Moderate protein-calorie malnutrition: Secondary | ICD-10-CM | POA: Diagnosis not present

## 2015-06-21 DIAGNOSIS — C787 Secondary malignant neoplasm of liver and intrahepatic bile duct: Secondary | ICD-10-CM | POA: Diagnosis present

## 2015-06-21 DIAGNOSIS — D649 Anemia, unspecified: Secondary | ICD-10-CM | POA: Diagnosis not present

## 2015-06-21 DIAGNOSIS — Z885 Allergy status to narcotic agent status: Secondary | ICD-10-CM | POA: Diagnosis not present

## 2015-06-21 DIAGNOSIS — Z66 Do not resuscitate: Secondary | ICD-10-CM | POA: Diagnosis present

## 2015-06-21 DIAGNOSIS — N320 Bladder-neck obstruction: Secondary | ICD-10-CM | POA: Diagnosis present

## 2015-06-21 DIAGNOSIS — G622 Polyneuropathy due to other toxic agents: Secondary | ICD-10-CM | POA: Diagnosis present

## 2015-06-21 DIAGNOSIS — Z79899 Other long term (current) drug therapy: Secondary | ICD-10-CM | POA: Diagnosis not present

## 2015-06-21 DIAGNOSIS — D61818 Other pancytopenia: Secondary | ICD-10-CM | POA: Diagnosis present

## 2015-06-21 DIAGNOSIS — D6481 Anemia due to antineoplastic chemotherapy: Secondary | ICD-10-CM | POA: Diagnosis present

## 2015-06-21 DIAGNOSIS — Z6823 Body mass index (BMI) 23.0-23.9, adult: Secondary | ICD-10-CM | POA: Diagnosis not present

## 2015-06-21 DIAGNOSIS — R000 Tachycardia, unspecified: Secondary | ICD-10-CM | POA: Insufficient documentation

## 2015-06-21 DIAGNOSIS — Z8249 Family history of ischemic heart disease and other diseases of the circulatory system: Secondary | ICD-10-CM | POA: Diagnosis not present

## 2015-06-21 DIAGNOSIS — R29810 Facial weakness: Secondary | ICD-10-CM | POA: Diagnosis present

## 2015-06-21 DIAGNOSIS — G9340 Encephalopathy, unspecified: Secondary | ICD-10-CM | POA: Diagnosis present

## 2015-06-21 DIAGNOSIS — Z91041 Radiographic dye allergy status: Secondary | ICD-10-CM | POA: Diagnosis not present

## 2015-06-21 DIAGNOSIS — M79601 Pain in right arm: Secondary | ICD-10-CM | POA: Diagnosis present

## 2015-06-21 DIAGNOSIS — L271 Localized skin eruption due to drugs and medicaments taken internally: Secondary | ICD-10-CM | POA: Diagnosis present

## 2015-06-21 DIAGNOSIS — R630 Anorexia: Secondary | ICD-10-CM | POA: Diagnosis not present

## 2015-06-21 DIAGNOSIS — R531 Weakness: Secondary | ICD-10-CM | POA: Diagnosis present

## 2015-06-21 DIAGNOSIS — C3412 Malignant neoplasm of upper lobe, left bronchus or lung: Secondary | ICD-10-CM | POA: Diagnosis present

## 2015-06-21 DIAGNOSIS — F419 Anxiety disorder, unspecified: Secondary | ICD-10-CM | POA: Diagnosis present

## 2015-06-21 DIAGNOSIS — E0590 Thyrotoxicosis, unspecified without thyrotoxic crisis or storm: Secondary | ICD-10-CM | POA: Diagnosis present

## 2015-06-21 DIAGNOSIS — R7303 Prediabetes: Secondary | ICD-10-CM | POA: Diagnosis present

## 2015-06-21 DIAGNOSIS — E860 Dehydration: Secondary | ICD-10-CM | POA: Diagnosis not present

## 2015-06-21 DIAGNOSIS — C3411 Malignant neoplasm of upper lobe, right bronchus or lung: Secondary | ICD-10-CM | POA: Diagnosis present

## 2015-06-21 DIAGNOSIS — D6959 Other secondary thrombocytopenia: Secondary | ICD-10-CM | POA: Diagnosis present

## 2015-06-21 DIAGNOSIS — N390 Urinary tract infection, site not specified: Secondary | ICD-10-CM | POA: Diagnosis present

## 2015-06-21 DIAGNOSIS — Z87891 Personal history of nicotine dependence: Secondary | ICD-10-CM | POA: Diagnosis not present

## 2015-06-21 DIAGNOSIS — E785 Hyperlipidemia, unspecified: Secondary | ICD-10-CM | POA: Diagnosis present

## 2015-06-21 DIAGNOSIS — I10 Essential (primary) hypertension: Secondary | ICD-10-CM | POA: Diagnosis not present

## 2015-06-21 DIAGNOSIS — E559 Vitamin D deficiency, unspecified: Secondary | ICD-10-CM | POA: Diagnosis present

## 2015-06-21 DIAGNOSIS — Z888 Allergy status to other drugs, medicaments and biological substances status: Secondary | ICD-10-CM | POA: Diagnosis not present

## 2015-06-21 DIAGNOSIS — C7951 Secondary malignant neoplasm of bone: Secondary | ICD-10-CM | POA: Diagnosis not present

## 2015-06-21 DIAGNOSIS — R490 Dysphonia: Secondary | ICD-10-CM | POA: Diagnosis present

## 2015-06-21 LAB — CBC
HCT: 19.8 % — ABNORMAL LOW (ref 36.0–46.0)
HCT: 23.5 % — ABNORMAL LOW (ref 36.0–46.0)
Hemoglobin: 6.1 g/dL — CL (ref 12.0–15.0)
Hemoglobin: 7.6 g/dL — ABNORMAL LOW (ref 12.0–15.0)
MCH: 31.4 pg (ref 26.0–34.0)
MCH: 32.1 pg (ref 26.0–34.0)
MCHC: 30.8 g/dL (ref 30.0–36.0)
MCHC: 32.3 g/dL (ref 30.0–36.0)
MCV: 102.1 fL — ABNORMAL HIGH (ref 78.0–100.0)
MCV: 99.2 fL (ref 78.0–100.0)
PLATELETS: 42 10*3/uL — AB (ref 150–400)
PLATELETS: 55 10*3/uL — AB (ref 150–400)
RBC: 1.94 MIL/uL — ABNORMAL LOW (ref 3.87–5.11)
RBC: 2.37 MIL/uL — AB (ref 3.87–5.11)
RDW: 21.1 % — AB (ref 11.5–15.5)
RDW: 21.2 % — ABNORMAL HIGH (ref 11.5–15.5)
WBC: 3.5 10*3/uL — AB (ref 4.0–10.5)
WBC: 4.3 10*3/uL (ref 4.0–10.5)

## 2015-06-21 LAB — COMPREHENSIVE METABOLIC PANEL
ALBUMIN: 1.5 g/dL — AB (ref 3.5–5.0)
ALK PHOS: 65 U/L (ref 38–126)
ALT: 8 U/L — ABNORMAL LOW (ref 14–54)
AST: 15 U/L (ref 15–41)
Anion gap: 7 (ref 5–15)
BILIRUBIN TOTAL: 0.6 mg/dL (ref 0.3–1.2)
BUN: 12 mg/dL (ref 6–20)
CALCIUM: 7.6 mg/dL — AB (ref 8.9–10.3)
CO2: 23 mmol/L (ref 22–32)
CREATININE: 0.67 mg/dL (ref 0.44–1.00)
Chloride: 105 mmol/L (ref 101–111)
GFR calc Af Amer: >60 mL/min (ref >60)
GLUCOSE: 94 mg/dL (ref 65–99)
Potassium: 3.2 mmol/L — ABNORMAL LOW (ref 3.5–5.1)
Sodium: 135 mmol/L (ref 135–145)
TOTAL PROTEIN: 5.5 g/dL — AB (ref 6.5–8.1)

## 2015-06-21 LAB — TROPONIN I
Troponin I: <0.03 ng/mL (ref <0.031)
Troponin I: <0.03 ng/mL (ref <0.031)

## 2015-06-21 LAB — PROTIME-INR
INR: 1.29 (ref 0.00–1.49)
PROTHROMBIN TIME: 16.2 s — AB (ref 11.6–15.2)

## 2015-06-21 LAB — PHOSPHORUS: Phosphorus: 2.8 mg/dL (ref 2.5–4.6)

## 2015-06-21 LAB — FERRITIN: FERRITIN: 2841 ng/mL — AB (ref 11–307)

## 2015-06-21 LAB — RETICULOCYTES
RBC.: 1.94 MIL/uL — AB (ref 3.87–5.11)
RETIC CT PCT: 0.7 % (ref 0.4–3.1)
Retic Count, Absolute: 13.6 10*3/uL — ABNORMAL LOW (ref 19.0–186.0)

## 2015-06-21 LAB — TSH: TSH: 7.395 u[IU]/mL — ABNORMAL HIGH (ref 0.350–4.500)

## 2015-06-21 LAB — IRON AND TIBC
Iron: 31 ug/dL (ref 28–170)
SATURATION RATIOS: 26 % (ref 10.4–31.8)
TIBC: 118 ug/dL — ABNORMAL LOW (ref 250–450)
UIBC: 87 ug/dL

## 2015-06-21 LAB — PREPARE RBC (CROSSMATCH)

## 2015-06-21 LAB — LACTATE DEHYDROGENASE: LDH: 269 U/L — ABNORMAL HIGH (ref 98–192)

## 2015-06-21 LAB — FIBRINOGEN: FIBRINOGEN: 719 mg/dL — AB (ref 204–475)

## 2015-06-21 LAB — VITAMIN B12: Vitamin B-12: 729 pg/mL (ref 180–914)

## 2015-06-21 LAB — FOLATE: FOLATE: 21 ng/mL (ref >5.9)

## 2015-06-21 LAB — PREALBUMIN: Prealbumin: 2.3 mg/dL — ABNORMAL LOW (ref 18–38)

## 2015-06-21 LAB — MAGNESIUM: MAGNESIUM: 1.5 mg/dL — AB (ref 1.7–2.4)

## 2015-06-21 LAB — APTT: aPTT: 39 seconds — ABNORMAL HIGH (ref 24–37)

## 2015-06-21 MED ORDER — SENNOSIDES-DOCUSATE SODIUM 8.6-50 MG PO TABS
1.0000 | ORAL_TABLET | Freq: Every day | ORAL | Status: DC
  Administered 2015-06-21 – 2015-06-24 (×4): 1 via ORAL
  Filled 2015-06-21 (×4): qty 1

## 2015-06-21 MED ORDER — ALBUTEROL SULFATE HFA 108 (90 BASE) MCG/ACT IN AERS: 1.0000 | INHALATION_SPRAY | Freq: Four times a day (QID) | RESPIRATORY_TRACT | Status: DC | PRN

## 2015-06-21 MED ORDER — ONDANSETRON HCL 4 MG/2ML IJ SOLN: 4.0000 mg | Freq: Four times a day (QID) | INTRAMUSCULAR | Status: DC | PRN

## 2015-06-21 MED ORDER — POTASSIUM CHLORIDE CRYS ER 20 MEQ PO TBCR
40.0000 meq | EXTENDED_RELEASE_TABLET | Freq: Once | ORAL | Status: AC
  Administered 2015-06-21: 40 meq via ORAL
  Filled 2015-06-21: qty 2

## 2015-06-21 MED ORDER — TRAMADOL HCL 50 MG PO TABS
50.0000 mg | ORAL_TABLET | Freq: Three times a day (TID) | ORAL | Status: DC | PRN
  Administered 2015-06-21 – 2015-06-22 (×3): 50 mg via ORAL
  Administered 2015-06-22: 100 mg via ORAL
  Filled 2015-06-21 (×3): qty 1
  Filled 2015-06-21: qty 2
  Filled 2015-06-21: qty 1

## 2015-06-21 MED ORDER — DEXTROSE 5 % IV SOLN
1.0000 g | INTRAVENOUS | Status: DC
  Administered 2015-06-21 – 2015-06-24 (×4): 1 g via INTRAVENOUS
  Filled 2015-06-21 (×4): qty 10

## 2015-06-21 MED ORDER — VITAMINS A & D EX OINT
TOPICAL_OINTMENT | CUTANEOUS | Status: AC
  Administered 2015-06-21: 14:00:00
  Filled 2015-06-21: qty 5

## 2015-06-21 MED ORDER — ALBUTEROL SULFATE (2.5 MG/3ML) 0.083% IN NEBU: 2.5000 mg | INHALATION_SOLUTION | RESPIRATORY_TRACT | Status: DC | PRN

## 2015-06-21 MED ORDER — LEVOTHYROXINE SODIUM 50 MCG PO TABS
150.0000 ug | ORAL_TABLET | Freq: Every day | ORAL | Status: DC
  Administered 2015-06-21 – 2015-06-24 (×4): 150 ug via ORAL
  Filled 2015-06-21 (×4): qty 1

## 2015-06-21 MED ORDER — SODIUM CHLORIDE 0.9 % IV SOLN
INTRAVENOUS | Status: AC
  Administered 2015-06-21: 07:00:00 via INTRAVENOUS

## 2015-06-21 MED ORDER — SODIUM CHLORIDE 0.9 % IV SOLN
Freq: Once | INTRAVENOUS | Status: AC
  Administered 2015-06-21: 11:00:00 via INTRAVENOUS

## 2015-06-21 MED ORDER — GUAIFENESIN ER 600 MG PO TB12
600.0000 mg | ORAL_TABLET | Freq: Two times a day (BID) | ORAL | Status: DC
  Administered 2015-06-21 – 2015-06-24 (×5): 600 mg via ORAL
  Filled 2015-06-21 (×5): qty 1

## 2015-06-21 MED ORDER — ENSURE ENLIVE PO LIQD
237.0000 mL | Freq: Two times a day (BID) | ORAL | Status: DC
  Administered 2015-06-21 – 2015-06-22 (×4): 237 mL via ORAL

## 2015-06-21 MED ORDER — SODIUM CHLORIDE 0.9 % IJ SOLN
10.0000 mL | INTRAMUSCULAR | Status: DC | PRN
  Administered 2015-06-22 – 2015-06-24 (×2): 10 mL
  Filled 2015-06-21 (×2): qty 40

## 2015-06-21 MED ORDER — DIPHENHYDRAMINE HCL 25 MG PO CAPS
25.0000 mg | ORAL_CAPSULE | Freq: Once | ORAL | Status: AC
  Administered 2015-06-21: 25 mg via ORAL
  Filled 2015-06-21: qty 1

## 2015-06-21 MED ORDER — SODIUM CHLORIDE 0.9 % IV SOLN
Freq: Once | INTRAVENOUS | Status: AC
  Administered 2015-06-21: 02:00:00 via INTRAVENOUS

## 2015-06-21 MED ORDER — BISACODYL 10 MG RE SUPP: 10.0000 mg | Freq: Every day | RECTAL | Status: DC | PRN

## 2015-06-21 MED ORDER — ACETAMINOPHEN 325 MG PO TABS
650.0000 mg | ORAL_TABLET | Freq: Once | ORAL | Status: AC
  Administered 2015-06-21: 650 mg via ORAL
  Filled 2015-06-21: qty 2

## 2015-06-21 MED ORDER — ALPRAZOLAM 0.25 MG PO TABS
0.2500 mg | ORAL_TABLET | Freq: Three times a day (TID) | ORAL | Status: DC | PRN
  Administered 2015-06-21: 0.5 mg via ORAL
  Administered 2015-06-21 – 2015-06-24 (×4): 0.25 mg via ORAL
  Filled 2015-06-21 (×3): qty 1
  Filled 2015-06-21 (×2): qty 2

## 2015-06-21 MED ORDER — POLYETHYLENE GLYCOL 3350 17 G PO PACK
17.0000 g | PACK | Freq: Every day | ORAL | Status: DC | PRN
  Administered 2015-06-23: 17 g via ORAL
  Filled 2015-06-21: qty 1

## 2015-06-21 MED ORDER — SODIUM CHLORIDE 0.9 % IJ SOLN
3.0000 mL | Freq: Two times a day (BID) | INTRAMUSCULAR | Status: DC
  Administered 2015-06-21: 3 mL via INTRAVENOUS

## 2015-06-21 MED ORDER — ONDANSETRON HCL 4 MG PO TABS: 4.0000 mg | ORAL_TABLET | Freq: Four times a day (QID) | ORAL | Status: DC | PRN

## 2015-06-21 NOTE — Progress Notes (Signed)
PROGRESS NOTE  Christina Hale DEY:[id number] DOB: [birth year] DOA: 06/20/2015 PCP: Sheela Stack, MD  HPI: 65 yo F with history of lung cancer metastasized to the brain and liver currently on chemotherapy, admitted 1/14 pm with weakness, intermittent confusion and profound anemia.   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 Benay Spice in am   Metastatic lung cancer - to the brain and liver - patient with residual right sided weakness and facial droop - focal weakness at baseline per patient and daughter - she underwent a CT scan of the brain as an outpatient on 1/3 without acute findings or evidence of progression  Acute encephalopathy  - likely multifactorial will hold Remeron as this likely contributed to her increased confusion.  - she is clear this morning - possible UTI, start Ceftriaxone - continue hydration - PT evaluation pending  Dehydration  - administer IV fluids gently patient has significant hypoalbuminemia   Malnutrition  - will obtain prealbumin and nutritional consult  Hypokalemia - replete and monitor, due to poor po intake  Possible UTI - Ceftriaxone, urine cultures  Hyperthyroidism - status post radioactive iodine treatment on 08/21/2014 - continue synthroid - TSH 7.3   Diet: Diet Heart Room service appropriate?: Yes; Fluid consistency:: Thin Fluids: NS DVT Prophylaxis: SCD   Code Status: DNR Family  Communication: d/w daughter bedside  Disposition Plan: remain inpatient  Barriers to discharge: pancytopenia, IV therapies  Consultants:  None   Procedures:  None    Antibiotics  Anti-infectives    Start     Dose/Rate Route Frequency Ordered Stop   06/21/15 1000  cefTRIAXone (ROCEPHIN) 1 g in dextrose 5 % 50 mL IVPB     1 g 100 mL/hr over 30 Minutes Intravenous Every 24 hours 06/21/15 0829        Studies  Dg Chest Port 1 View  06/20/2015  CLINICAL DATA:  Lung carcinoma.  Confusion.  Hypertension. EXAM: PORTABLE CHEST 1 VIEW COMPARISON:  Chest radiograph March 02, 2015. Chest CT May 07, 2015 FINDINGS: There is a spiculated lesion in the right upper lobe, essentially stable. There is a stable cavitary lesion in the right upper lobe medially extending into the right apex, unchanged. There is a minimal left pleural effusion, not present previously. No new parenchymal lung opacity. Heart size is unremarkable. The pulmonary vascularity is stable and within normal limits given the underlying left upper lobe lesion. Port-A-Cath tip is in the superior vena cava. No pneumothorax. No adenopathy is apparent radiographically. IMPRESSION: Bilateral upper lobe lesions, much larger on the left than on the right, stable. The left upper lobe lesion has an area of cavitation, stable. No new parenchymal lung lesion identified. There is a new rather minimal left pleural effusion. No change in cardiac silhouette. No pneumothorax. Electronically Signed   By: Lowella Grip III M.D.   On: 06/20/2015 20:15   Objective  Filed Vitals:   06/21/15 0350 06/21/15 0610 06/21/15 1115 06/21/15 1145  BP:  117/53 148/67 127/45 137/67  Pulse: 89 94 103 105  Temp: 98 F (36.7 C) 97.6 F (36.4 C) 97.7 F (36.5 C) 97.8 F (36.6 C)  TempSrc: Axillary Axillary Oral Axillary  Resp: '20 20 18   '$ Height:      Weight:      SpO2: 99% 100% 100% 98%    Intake/Output Summary (Last 24 hours) at 06/21/15  1250 Last data filed at 06/21/15 1145  Gross per 24 hour  Intake 415.84 ml  Output    400 ml  Net  15.84 ml   Filed Weights   06/20/15 1924 06/21/15 0117  Weight: 75.751 kg (167 lb) 75.8 kg (167 lb 1.7 oz)   Exam:  GENERAL: NAD, appears weak, pale  HEENT: no scleral icterus  NECK: supple, no LAD  LUNGS: CTA biL, no wheezing  HEART: RRR without MRG  ABDOMEN: soft, non tender  EXTREMITIES: no clubbing / cyanosis. RUE and RLE with swelling  NEUROLOGIC: right sided weakness upper and lower extremity  Data Reviewed: Basic Metabolic Panel:  Recent Labs Lab 06/20/15 2025 06/21/15 0310  NA 135 135  K 3.5 3.2*  CL 102 105  CO2 24 23  GLUCOSE 152* 94  BUN 12 12  CREATININE 0.74 0.67  CALCIUM 7.9* 7.6*  MG  --  1.5*  PHOS  --  2.8   Liver Function Tests:  Recent Labs Lab 06/20/15 2025 06/21/15 0310  AST 20 15  ALT 9* 8*  ALKPHOS 71 65  BILITOT 0.5 0.6  PROT 6.1* 5.5*  ALBUMIN 1.7* 1.5*    Recent Labs Lab 06/20/15 2304  AMMONIA 11   CBC:  Recent Labs Lab 06/20/15 2025 06/21/15 0310 06/21/15 0855  WBC 4.2 3.5* 4.3  NEUTROABS 3.0  --   --   HGB 7.2* 6.1* 7.6*  HCT 23.1* 19.8* 23.5*  MCV 100.9* 102.1* 99.2  PLT 61* 42* 55*   Cardiac Enzymes:  Recent Labs Lab 06/21/15 0310 06/21/15 0855  TROPONINI <0.03 <0.03   Scheduled Meds: . cefTRIAXone (ROCEPHIN)  IV  1 g Intravenous Q24H  . feeding supplement (ENSURE ENLIVE)  237 mL Oral BID BM  . guaiFENesin  600 mg Oral BID  . levothyroxine  150 mcg Oral QAC breakfast  . senna-docusate  1 tablet Oral Daily  . sodium chloride  3 mL Intravenous Q12H   Continuous Infusions:   Marzetta Board, MD [facility] Pager [phone]. If 7 PM - 7 AM, please contact night-coverage at [email or web address], password [facility] 06/21/2015, 12:50 PM  LOS: 0 days

## 2015-06-22 DIAGNOSIS — C7989 Secondary malignant neoplasm of other specified sites: Secondary | ICD-10-CM

## 2015-06-22 DIAGNOSIS — D649 Anemia, unspecified: Secondary | ICD-10-CM

## 2015-06-22 DIAGNOSIS — C3412 Malignant neoplasm of upper lobe, left bronchus or lung: Secondary | ICD-10-CM

## 2015-06-22 DIAGNOSIS — R630 Anorexia: Secondary | ICD-10-CM

## 2015-06-22 DIAGNOSIS — C7951 Secondary malignant neoplasm of bone: Secondary | ICD-10-CM

## 2015-06-22 DIAGNOSIS — E860 Dehydration: Secondary | ICD-10-CM

## 2015-06-22 DIAGNOSIS — R627 Adult failure to thrive: Secondary | ICD-10-CM

## 2015-06-22 DIAGNOSIS — R21 Rash and other nonspecific skin eruption: Secondary | ICD-10-CM

## 2015-06-22 DIAGNOSIS — C7931 Secondary malignant neoplasm of brain: Secondary | ICD-10-CM

## 2015-06-22 LAB — TYPE AND SCREEN
ABO/RH(D): A POS
Antibody Screen: NEGATIVE
UNIT DIVISION: 0
Unit division: 0

## 2015-06-22 LAB — COMPREHENSIVE METABOLIC PANEL
ALT: 8 U/L — ABNORMAL LOW (ref 14–54)
AST: 17 U/L (ref 15–41)
Albumin: 1.6 g/dL — ABNORMAL LOW (ref 3.5–5.0)
Alkaline Phosphatase: 68 U/L (ref 38–126)
Anion gap: 8 (ref 5–15)
BUN: 10 mg/dL (ref 6–20)
CHLORIDE: 107 mmol/L (ref 101–111)
CO2: 24 mmol/L (ref 22–32)
Calcium: 7.9 mg/dL — ABNORMAL LOW (ref 8.9–10.3)
Creatinine, Ser: 0.62 mg/dL (ref 0.44–1.00)
Glucose, Bld: 112 mg/dL — ABNORMAL HIGH (ref 65–99)
POTASSIUM: 3.5 mmol/L (ref 3.5–5.1)
SODIUM: 139 mmol/L (ref 135–145)
Total Bilirubin: 0.7 mg/dL (ref 0.3–1.2)
Total Protein: 5.7 g/dL — ABNORMAL LOW (ref 6.5–8.1)

## 2015-06-22 LAB — CBC
HEMATOCRIT: 27.5 % — AB (ref 36.0–46.0)
Hemoglobin: 8.7 g/dL — ABNORMAL LOW (ref 12.0–15.0)
MCH: 30.5 pg (ref 26.0–34.0)
MCHC: 31.6 g/dL (ref 30.0–36.0)
MCV: 96.5 fL (ref 78.0–100.0)
Platelets: 77 10*3/uL — ABNORMAL LOW (ref 150–400)
RBC: 2.85 MIL/uL — AB (ref 3.87–5.11)
RDW: 22.3 % — ABNORMAL HIGH (ref 11.5–15.5)
WBC: 7.2 10*3/uL (ref 4.0–10.5)

## 2015-06-22 LAB — HAPTOGLOBIN: HAPTOGLOBIN: 312 mg/dL — AB (ref 34–200)

## 2015-06-22 LAB — URINE CULTURE

## 2015-06-22 MED ORDER — ALPRAZOLAM 0.25 MG PO TABS
0.2500 mg | ORAL_TABLET | Freq: Once | ORAL | Status: AC
  Administered 2015-06-22: 0.25 mg via ORAL
  Filled 2015-06-22: qty 1

## 2015-06-22 MED ORDER — ZOLPIDEM TARTRATE 5 MG PO TABS: 5.0000 mg | ORAL_TABLET | Freq: Once | ORAL | Status: AC

## 2015-06-22 MED ORDER — BOOST PLUS PO LIQD
237.0000 mL | Freq: Three times a day (TID) | ORAL | Status: DC
Start: 2015-06-22 — End: 2015-06-24
  Administered 2015-06-22 – 2015-06-24 (×6): 237 mL via ORAL
  Filled 2015-06-22 (×7): qty 237

## 2015-06-22 MED ORDER — ZOLPIDEM TARTRATE 5 MG PO TABS
5.0000 mg | ORAL_TABLET | Freq: Once | ORAL | Status: AC
  Administered 2015-06-22: 5 mg via ORAL
  Filled 2015-06-22: qty 1

## 2015-06-22 NOTE — Clinical Social Work Note (Signed)
Clinical Social Work Assessment  Patient Details  Name: Christina Hale MRN: [id number] Date of Birth: Mar 05, 1951  Date of referral:  06/22/15               Reason for consult:  Facility Placement                Permission sought to share information with:  Facility Sport and exercise psychologist, Family Supports Permission granted to share information::  Yes, Verbal Permission Granted  Name::     [place]::  [facility] SNF search  Relationship::  Husband   Contact Information:     Housing/Transportation Living arrangements for the past 2 months:  [place] of Information:  Patient, Spouse Patient Interpreter Needed:  None Criminal Activity/Legal Involvement Pertinent to Current Situation/Hospitalization:  No - Comment as needed Significant Relationships:  Adult Children, Spouse Lives with:  Spouse Do you feel safe going back to the place where you live?    Need for family participation in patient care:  Yes (Comment)  Care giving concerns:  Pt admitted from home with husband. PT recommending short-term rehab at [facility].    Social Worker assessment / plan:  BSW Intern met with pt and pt family members at bedside. Pt husband was present.  BSW Intern introduced self and role. BSW Intern explained referral for short-term rehab at a SNF.   Pt and pt family agreeable to [facility] search as an option.  Pt and pt family interested in considering other options for pt. Pt family worried if rehab will be beneficial due to pt medical state and her bone marrow. Pt and pt husband also concerned about pt anxiety and would prefer her to be home at nighttime. BSW Intern explained option of checking pt out of a facility during the daytime as an option. Pt husband expressed interest in [facility] PT as another option which pt received every other day in the past.   Pt family wish to speak with MD concerning pt medical state and discuss best option for pt when medically ready  for dc.  BSW Intern to conduct [facility] search. BSW Intern to follow-up with pt and pt family with bed offers and inquire about their decision for pt after they speak with MD.    Employment status:  Retired Insurance underwriter information:  Other (Comment Required) Nurse, mental health) PT Recommendations:  [place] / Referral to community resources:  [place]  Patient/Family's Response to care:  Pt alert and oriented x3. Pt responsive to most questions. Pt seemed tired and anxious about staying overnight at a SNF. Pt family understanding of pt care and option for short-term rehab at a SNF.  Patient/Family's Understanding of and Emotional Response to Diagnosis, Current Treatment, and Prognosis:  Pt and pt family understanding of diagnosis and treatment. Pt family interested in speaking with MD regarding the most beneficial care for pt after dc.  Emotional Assessment Appearance:  Appears older than stated age Attitude/Demeanor/Rapport:  Apprehensive Affect (typically observed):  Anxious, Quiet Orientation:  Oriented to Self, Oriented to Place, Oriented to Situation Alcohol / Substance use:  Not Applicable Psych involvement (Current and /or in the community):  No (Comment)  Discharge Needs  Concerns to be addressed:  Discharge Planning Concerns Readmission within the last 30 days:  No Current discharge risk:  None Barriers to Discharge:  Continued Medical Work up   [employer], Student-SW 06/22/2015, 1:40 PM

## 2015-06-22 NOTE — Evaluation (Signed)
Physical Therapy Evaluation Patient Details Name: Christina Hale MRN: [id number] DOB: 01-May-1951 Today's Date: 06/22/2015   History of Present Illness  65 yo female admitted with weakness, inability to ambulate, confusion, anemia. Hx of lung cancer with mets to brain, liver, R side weakness, facial droop.   Clinical Impression  Bed level eval only. Pt unable to tolerate bed mobility on today. Pt did agree to strength/ROM assessment and performance of AAROM exercises. Educated pt on trying to keep R UE/LEs elevated due to edema. Husband not present during session. Recommend SNF.     Follow Up Recommendations SNF    Equipment Recommendations   (to be determined)    Recommendations for Other Services       Precautions / Restrictions Precautions Precautions: Fall Restrictions Weight Bearing Restrictions: No      Mobility  Bed Mobility               General bed mobility comments: NT-pt unable to tolerate on today.  Transfers                    Ambulation/Gait                Stairs            Wheelchair Mobility    Modified Rankin (Stroke Patients Only)       Balance                                             Pertinent Vitals/Pain Pain Assessment: Faces Faces Pain Scale: Hurts whole lot Pain Location: generalized pain "all over" Pain Descriptors / Indicators: Aching;Sore Pain Intervention(s): Monitored during session;Repositioned    Home Living Family/patient expects to be discharged to:: Private residence Living Arrangements: Spouse/significant other Available Help at Discharge: Family Type of Home: House Home Access: Stairs to enter Entrance Stairs-Rails: Psychiatric nurse of Steps: 3 Home Layout: One level Home Equipment: Environmental consultant - 2 wheels      Prior Function Level of Independence: Needs assistance   Gait / Transfers Assistance Needed: mostly bedbound for last couple of weeks. Prior to  this, pt was using a walker           Hand Dominance        Extremity/Trunk Assessment   Upper Extremity Assessment: Defer to OT evaluation           Lower Extremity Assessment: LLE deficits/detail;RLE deficits/detail RLE Deficits / Details: DF 0/5, PF 1/5, hip flex 2-/5, hip abd/add 2-/5. Edema noted. LLE Deficits / Details: hip flex 3/5, DF/PF 3/5, hip abd/add 3/5     Communication   Communication: No difficulties  Cognition Arousal/Alertness: Awake/alert Behavior During Therapy: WFL for tasks assessed/performed   Area of Impairment: Memory;Problem solving             Problem Solving: Requires tactile cues;Requires verbal cues      General Comments      Exercises General Exercises - Lower Extremity Ankle Circles/Pumps: AROM;Left;AAROM;Right;10 reps;Supine Heel Slides: AAROM;Both;10 reps;Supine Hip ABduction/ADduction: AAROM;Both;10 reps;Supine      Assessment/Plan    PT Assessment Patient needs continued PT services  PT Diagnosis Difficulty walking;Generalized weakness;Acute pain;Altered mental status (R hemiparesis)   PT Problem List Decreased strength;Decreased activity tolerance;Decreased balance;Decreased mobility;Pain;Decreased knowledge of use of DME;Decreased cognition  PT Treatment Interventions DME instruction;Functional mobility training;Therapeutic activities;Patient/family education;Balance training;Therapeutic exercise   PT  Goals (Current goals can be found in the Care Plan section) Acute Rehab PT Goals Patient Stated Goal: less pain. to feel better PT Goal Formulation: With patient Time For Goal Achievement: 07/06/15 Potential to Achieve Goals: Fair    Frequency Min 3X/week   Barriers to discharge        Co-evaluation               End of Session   Activity Tolerance: Patient limited by fatigue;Patient limited by pain Patient left: in bed;with call bell/phone within reach;with bed alarm set;with family/visitor present            Time: 1016-1030 PT Time Calculation (min) (ACUTE ONLY): 14 min   Charges:   PT Evaluation $PT Eval Moderate Complexity: 1 Procedure     PT G Codes:        Weston Anna, MPT Pager: [phone]

## 2015-06-22 NOTE — Progress Notes (Signed)
IP PROGRESS NOTE  Subjective:   Christina Hale is well-known to me with a history of metastatic non-small cell lung cancer. She is currently being treated with Alimta/Avastin and last received chemotherapy 06/10/2015. When I saw her 06/09/2015 she had increased right-sided weakness. A brain CT showed no evidence of progressive brain metastases or acute findings.  Christina Hale was admitted 06/20/2015 with severe anemia and "weakness ". She reports feeling stronger following a red cell transfusion. She has anorexia.  Objective: Vital signs in last 24 hours: Blood pressure 133/72, pulse 108, temperature 97.8 F (36.6 C), temperature source Oral, resp. rate 18, height 5' 10"  (1.778 m), weight 167 lb 1.7 oz (75.8 kg), SpO2 97 %.  Intake/Output from previous day: 01/15 0701 - 01/16 0700 In: 541 [I.V.:205; Blood:336] Out: 875 [Urine:875]  Physical Exam:  HEENT: No thrush Lungs: Clear bilaterally Cardiac: Regular rate and rhythm Abdomen: No hepatomegaly, nontender Extremities: Pitting edema at the right lower arm and hand. Trace pitting edema at the right greater than left lower leg with chronic stasis change Neurologic: Alert and oriented, follows commands, decreased strength of the right arm and leg  Portacath/PICC-without erythema  Lab Results:  Recent Labs  06/21/15 0855 06/22/15 0545  WBC 4.3 7.2  HGB 7.6* 8.7*  HCT 23.5* 27.5*  PLT 55* 77*    BMET  Recent Labs  06/21/15 0310 06/22/15 0545  NA 135 139  K 3.2* 3.5  CL 105 107  CO2 23 24  GLUCOSE 94 112*  BUN 12 10  CREATININE 0.67 0.62  CALCIUM 7.6* 7.9*    Studies/Results: Dg Chest Port 1 View  06/20/2015  CLINICAL DATA:  Lung carcinoma.  Confusion.  Hypertension. EXAM: PORTABLE CHEST 1 VIEW COMPARISON:  Chest radiograph March 02, 2015. Chest CT May 07, 2015 FINDINGS: There is a spiculated lesion in the right upper lobe, essentially stable. There is a stable cavitary lesion in the right upper lobe medially  extending into the right apex, unchanged. There is a minimal left pleural effusion, not present previously. No new parenchymal lung opacity. Heart size is unremarkable. The pulmonary vascularity is stable and within normal limits given the underlying left upper lobe lesion. Port-A-Cath tip is in the superior vena cava. No pneumothorax. No adenopathy is apparent radiographically. IMPRESSION: Bilateral upper lobe lesions, much larger on the left than on the right, stable. The left upper lobe lesion has an area of cavitation, stable. No new parenchymal lung lesion identified. There is a new rather minimal left pleural effusion. No change in cardiac silhouette. No pneumothorax. Electronically Signed   By: Lowella Grip III M.D.   On: 06/20/2015 20:15    Medications: I have reviewed the patient's current medications.  Assessment/Plan:  1. Stage IV adenocarcinoma of the lung, dominant left upper lobe mass and spiculated right upper lobe mass  Status post a biopsy of a left hepatic mass confirming metastatic adenocarcinoma consistent with a lung primary, ALK mutation negative, EGFR amplification suboptimal-repeat testing recommend   Staging CT scans 09/27/2013 confirmed a dominant left upper lung mass, a smaller right upper lung mass, left hepatic mass, and left external iliac node.   Cycle 1 Taxol/carboplatin and Avastin per the CTSU Q2595 study 10/09/2013.   Cycle 2 Taxol/carboplatin and Avastin 10/29/2013.   Restaging CT evaluation 11/14/2013 with improvement in the lungs and liver. Left external iliac lymph node was stable.   Cycle 3 Taxol/carboplatin and Avastin 12/02/2013   Restaging CT 01/02/2014 with a mixed response-slightly larger left upper lung mass  and mediastinal lymph node, slightly smaller liver mass and iliac node   Maintenance Avastin beginning 01/06/2014   Restaging CT 03/06/2014 with a decrease in the size of the lung masses and a liver lesion  Restaging CT  05/22/2014 with a stable right upper lung nodule, decreased size of a left upper lobe lung mass, increased size of a liver lesion, stable left pelvic nodule/node.  Restaging CT 07/24/2014 with no significant change in the chest, increase in size of the mass within the left hepatic lobe, 2 retroperitoneal lymph nodes increased in size, stable appearance of peritoneal nodules.  brain/ skull metastases documented on an MRI of the brain/cervical spine 09/23/2014, taken off of the E5508 study  Restaging CTs 10/09/2014 -slight decrease in size of the left upper lobe cavitary mass , enlargement of the dominant left liver lesion, no significant change in the size of abdominal or chest lymph nodes except for enlargement of a subcarinal node  Cycle 1 Alimta/Avastin 10/20/2014  Cycle 2 Alimta/Avastin 11/10/2014  Cycle 3 Alimta/Avastin 12/01/2014  Cycle 4 Alimta/Avastin 12/22/2014  Cycle 5 Alimta/Avastin 01/12/2015  Cycle 6 Alimta/Avastin 02/02/2015  MRI brain 01/08/2015 with regression of all 5 brain metastases. 3 still visible. Bone metastases not significantly changed.  Restaging CTs 02/19/2015 with stable left upper lobe cavitary mass, stable right upper lobe lobular nodule, stable left hepatic lobe metastatic lesion. No evidence of disease progression in the abdomen or pelvis.  Cycle 7 Alimta/Avastin 02/23/2015  Cycle 8 Alimta/Avastin 03/16/2015  Cycle 9 Alimta/Avastin 04/06/2015  Cycle 10 Alimta/Avastin 04/27/2015  Restaging CT of the chest abdomen and pelvis performed on 05/07/2015 showed an essentially stable left upper lobe paramediastinal mass. Potential new mild thickening in the mediastinal pleural surface in the anterior mediastinum left. Mild increase in subcarinal lymph node size. Prevascular lymph nodes are not changed. Stable right upper lobe pulmonary nodule. The round lesion in the left lateral hepatic lobe is slightly contraction side. No new hepatic lesions. Perineal nodular  metastasis similar compared to CT of 10/09/2014 and 02/19/2015.  Cycle 11 Alimta/Avastin 05/18/2015 2. C. difficile colitis March 2015 3. Hyponatremia/seizure on hospital admission 08/29/2013 4. Chronic right hip "bursitis". 5. Nausea and vomiting following cycle 1 Taxol/carboplatin, the anti-emetic regimen was adjusted with cycle 2. 6. Severe neutropenia following cycle 1 Taxol/carboplatin-Neulasta added with cycle 2. 7. 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 9. Anemia. Likely secondary to chemotherapy, chronic disease, and malnutrition versus bone marrow metastases. She received red cell transfusions 12/20/2013, 12/21/2013, and 06/21/2015 10. Hoarseness-improved, potentially related to toxicity from chemotherapy versus tumor. 11. Right retinal detachment 04/05/2014, status post surgical repair 04/07/2014 12. Low TSH with a borderline elevated T4- she was diagnosed with hyperthyroidism, status post radioactive iodine treatment on 08/21/2014 , started on thyroid hormone replacement by Dr. Forde Dandy 13. History of Proteinuria-mild 14. Decreased right nasolabial fold and right arm/leg weakness 09/22/2014  MRI brain/cervical spine 09/23/2014 revealed 5 brain metastases including a 3.1 cm brainstem metastasis in the left pons, 2 left posterior skull metastases, and a C1 metastasis  Decadron started 09/23/2014 a tapered to off  MRI brain 01/08/2015 with regression of all 5 brain metastases. 3 still visible. Bone metastases not significantly changed.  CT brain 06/09/2015-no acute finding, no evidence of disease progression 15. Neck pain, pain with swallowing-likely related to thyroid inflammation from I-131 therapy, improved with Decadron 16. Bilateral leg edema. Bilateral lower extremity venous Doppler negative 02/02/2015. Megace and Norvasc discontinued 02/23/2015. Leg edema markedly improved 03/16/2015. 17. Urine  culture 02/23/2015 with  Escherichia coli. She completed a course of ciprofloxacin. 18. Facial rash-the differential diagnosis includes acne, rosacea, a paraneoplastic rash, or rash secondary to a collagen vascular disorder 19. Thrombocytopenia secondary to chemotherapy versus bone marrow involvement with non-small cell lung cancer 20. Anorexia/failure to thrive-likely secondary to non-small cell lung cancer versus sequelae of brain radiation  Christina Hale has metastatic non-small cell lung cancer. She has been treated with Alimta/Avastin since May 2016 without evidence of disease progression. Her performance status has declined over the past few months. This may be related to radiation induced dementia versus depression versus unrecognized progression of lung cancer.  Recommendations: 1. Physical therapy evaluation noted, home PT versus skilled nursing facility placement 2. Follow-up as scheduled at the Cancer center 06/29/2015   I discussed the case with her daughter by telephone and with her husband at the bedside.    LOS: 1 day   [place] Zarazua  06/22/2015, 2:09 PM

## 2015-06-22 NOTE — NC FL2 (Signed)
[place] LEVEL OF CARE SCREENING TOOL     IDENTIFICATION  Patient Name: Christina Hale Birthdate: 05/17/1951 Sex: female Admission Date (Current Location): 06/20/2015  [facility] and [place] Number:  Herbalist and Address:  [facility],  [place] [address], [place]      Provider Number: [id number]  Attending Physician Name and Address:  Caren Griffins, MD  Relative Name and Phone Number:       Current Level of Care: Hospital Recommended Level of Care: [place] Prior Approval Number:    Date Approved/Denied:   PASRR Number: [id number]  Discharge Plan: SNF    Current Diagnoses: Patient Active Problem List   Diagnosis Date Noted  . Confusion   . Tachycardia   . Symptomatic anemia 06/20/2015  . Dehydration 06/20/2015  . Pancytopenia ([place]) 06/20/2015  . Bronchitis 03/02/2015  . UTI (urinary tract infection) 03/02/2015  . Peripheral edema 03/02/2015  . Hyperthyroidism 10/02/2014  . Brain metastases ([place]) 09/24/2014  . Bone metastases ([place]) 09/24/2014  . Esophagitis 09/18/2014  . Hypoalbuminemia 09/18/2014  . Medication management 03/03/2014  . 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 Liver mass 09/01/2013  . Seizures ([place]) 08/31/2013  . Smoker 08/31/2013  . Enteritis due to Clostridium difficile 08/31/2013  . Hyperlipidemia   . Hypertension   . Prediabetes   . Vitamin D deficiency   . Anxiety     Orientation RESPIRATION BLADDER Height & Weight    Self, Situation, Place  Normal Indwelling catheter      BEHAVIORAL SYMPTOMS/MOOD NEUROLOGICAL BOWEL NUTRITION STATUS      Continent Diet (regular Room service appropriate; Fluid consistency: Thin)  AMBULATORY STATUS COMMUNICATION OF NEEDS Skin   Extensive Assist Verbally Skin abrasions (2 skin tear to right elbow, bleeding controlled. )                       Personal Care Assistance Level of Assistance  Bathing,  Feeding, Dressing Bathing Assistance: Maximum assistance Feeding assistance: Independent Dressing Assistance: Maximum assistance     Functional Limitations Info  Sight, Hearing, Speech Sight Info: Adequate Hearing Info: Adequate Speech Info: Adequate    SPECIAL CARE FACTORS FREQUENCY  PT (By licensed PT), OT (By licensed OT)     PT Frequency: 5x week OT Frequency: 5x week            Contractures Contractures Info: Not present    Additional Factors Info  Code Status, Allergies Code Status Info: DNR Allergies Info: Contrast Media, Codeine, Prednisolone           Current Medications (06/22/2015):  This is the current hospital active medication list Current Facility-Administered Medications  Medication Dose Route Frequency Provider Last Rate Last Dose  . albuterol (PROVENTIL) (2.5 MG/3ML) 0.083% nebulizer solution 2.5 mg  2.5 mg Nebulization Q2H PRN Toy Baker, MD      . ALPRAZolam Duanne Moron) tablet 0.25-0.5 mg  0.25-0.5 mg Oral TID PRN Toy Baker, MD   0.5 mg at 06/21/15 2154  . bisacodyl (DULCOLAX) suppository 10 mg  10 mg Rectal Daily PRN Toy Baker, MD      . cefTRIAXone (ROCEPHIN) 1 g in dextrose 5 % 50 mL IVPB  1 g Intravenous Q24H Caren Griffins, MD   1 g at 06/22/15 0917  . feeding supplement (ENSURE ENLIVE) (ENSURE ENLIVE) liquid 237 mL  237 mL Oral BID BM Anastassia Doutova, MD   237 mL at 06/22/15 1000  .  guaiFENesin (MUCINEX) 12 hr tablet 600 mg  600 mg Oral BID Toy Baker, MD   600 mg at 06/22/15 0917  . levothyroxine (SYNTHROID, LEVOTHROID) tablet 150 mcg  150 mcg Oral QAC breakfast Toy Baker, MD   150 mcg at 06/22/15 0917  . ondansetron (ZOFRAN) tablet 4 mg  4 mg Oral Q6H PRN Toy Baker, MD       Or  . ondansetron (ZOFRAN) injection 4 mg  4 mg Intravenous Q6H PRN Toy Baker, MD      . polyethylene glycol (MIRALAX / GLYCOLAX) packet 17 g  17 g Oral Daily PRN Toy Baker, MD      . senna-docusate  (Senokot-S) tablet 1 tablet  1 tablet Oral Daily Toy Baker, MD   1 tablet at 06/22/15 0917  . sodium chloride 0.9 % injection 10-40 mL  10-40 mL Intracatheter PRN Toy Baker, MD   10 mL at 06/22/15 0545  . sodium chloride 0.9 % injection 3 mL  3 mL Intravenous Q12H Toy Baker, MD   3 mL at 06/21/15 0208  . traMADol (ULTRAM) tablet 50-100 mg  50-100 mg Oral Q8H PRN Toy Baker, MD   100 mg at 06/22/15 3435   Facility-Administered Medications Ordered in Other Encounters  Medication Dose Route Frequency Provider Last Rate Last Dose  . heparin lock flush 100 unit/mL  500 Units Intravenous Once Ladell Pier, MD   500 Units at 06/09/15 0912  . sodium chloride 0.9 % injection 10 mL  10 mL Intravenous PRN Ladell Pier, MD   10 mL at 06/09/15 0913     Discharge Medications: Please see discharge summary for a list of discharge medications.  Relevant Imaging Results:  Relevant Lab Results:   Additional Information SSN: [id number]  Harlon Flor, Student-SW [phone]

## 2015-06-22 NOTE — Progress Notes (Signed)
Initial Nutrition Assessment  DOCUMENTATION CODES:   Non-severe (moderate) malnutrition in context of chronic illness  INTERVENTION:  -Switch to Boost Plus 360 kcal, 14 g protein per bottle -Increase Boost Plus to TID  NUTRITION DIAGNOSIS:   Increased nutrient needs related to catabolic illness, cancer and cancer related treatments, poor appetite as evidenced by estimated needs.  GOAL:   Patient will meet greater than or equal to 90% of their needs  MONITOR:   PO intake, Supplement acceptance, Weight trends  REASON FOR ASSESSMENT:   Malnutrition Screening Tool  ASSESSMENT:   65 yo F with history of lung cancer metastasized to the brain and liver currently on chemotherapy, admitted 1/14 pm with weakness, intermittent confusion and profound anemia.   Pt was assessed because of MST score of 2. Pt reported decrease in appetite over the past month. Per family member reported a weight loss of 10-15 lbs over the past month. Per chart the Pt weight varies between 186-167 lbs (11% weight loss over 4 months, significant for time frame). BMI indicates normal weight.  Pt reported no issues with swallowing or chewing. Pt did report that since start of chemotherapy she has experienced changes in taste but reports it varies.  Pt reported that she drinks 3 Boost Plus at home per day and prefers the taste over Ensure Enlive. Pt reported not eating meal tray, the family didn't want to waste it so they consumed the meal tray.   Discussed with the family and Pt alternative ways to increase nutrition. Suggested adding ice cream to Boost Plus to make a milk shake and making smoothies and adding protein powder.   Unable to perform Nutrition Focused Physical Exam, will do at follow up. Non-severe malnutrition in the setting of chronic illness.   Pt is not meeting nutritional needs. Reviewed medications. Reviewed labs, Calcium decreased (7.9),   Diet Order:  Diet regular Room service appropriate?:  Yes; Fluid consistency:: Thin  Skin:  Wound (see comment) (Wound on right elbow)  Last BM:  06/16/15  Height:   Ht Readings from Last 1 Encounters:  06/21/15 '5\' 10"'$  (1.778 m)    Weight:   Wt Readings from Last 1 Encounters:  06/21/15 167 lb 1.7 oz (75.8 kg)    Ideal Body Weight:  68.18 kg (kg)  BMI:  Body mass index is 23.98 kg/(m^2).  Estimated Nutritional Needs:   Kcal:  2200-2400  Protein:  100-110  Fluid:  2.2-2.4 L  EDUCATION NEEDS:   No education needs identified at this time   [place], Dietetic Intern Pager: [phone]

## 2015-06-22 NOTE — Progress Notes (Signed)
PROGRESS NOTE  Christina Hale YCX:[id number] DOB: 1951-01-24 DOA: 06/20/2015 PCP: Sheela Stack, MD  HPI: 65 yo F with history of lung cancer metastasized to the brain and liver currently on chemotherapy, admitted 1/14 pm with weakness, intermittent confusion and profound anemia.   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 Benay Spice this morning  Metastatic lung cancer - to the brain and liver - patient with residual right sided weakness and facial droop - focal weakness at baseline per patient and daughter - she underwent a CT scan of the brain as an outpatient on 1/3 without acute findings or evidence of progression - outpatient management  Acute encephalopathy  - likely multifactorial will hold Remeron as this likely contributed to her increased confusion.  - she is clear this morning - possible UTI, continue Ceftriaxone - urine culture pending  Dehydration / weakness - s/p hydration, off fluids today, encourage diet  - PT recommending SNF, SW consulted today   Malnutrition  - nutrition support  Hypokalemia - replete and monitor, due to poor po intake  Possible UTI - Ceftriaxone, urine cultures  Hyperthyroidism - status post radioactive iodine treatment on 08/21/2014 - continue synthroid - TSH 7.3   Diet: Diet regular Room service appropriate?: Yes; Fluid consistency:: Thin Fluids: NS DVT Prophylaxis:  SCD   Code Status: DNR Family Communication: d/w family bedside Disposition Plan: remain inpatient, SNF 1-2 days Barriers to discharge: pancytopenia, IV therapies  Consultants:  None   Procedures:  None    Antibiotics  Anti-infectives    Start     Dose/Rate Route Frequency Ordered Stop   06/21/15 1000  cefTRIAXone (ROCEPHIN) 1 g in dextrose 5 % 50 mL IVPB     1 g 100 mL/hr over 30 Minutes Intravenous Every 24 hours 06/21/15 0829        Studies  Dg Chest Port 1 View  06/20/2015  CLINICAL DATA:  Lung carcinoma.  Confusion.  Hypertension. EXAM: PORTABLE CHEST 1 VIEW COMPARISON:  Chest radiograph March 02, 2015. Chest CT May 07, 2015 FINDINGS: There is a spiculated lesion in the right upper lobe, essentially stable. There is a stable cavitary lesion in the right upper lobe medially extending into the right apex, unchanged. There is a minimal left pleural effusion, not present previously. No new parenchymal lung opacity. Heart size is unremarkable. The pulmonary vascularity is stable and within normal limits given the underlying left upper lobe lesion. Port-A-Cath tip is in the superior vena cava. No pneumothorax. No adenopathy is apparent radiographically. IMPRESSION: Bilateral upper lobe lesions, much larger on the left than on the right, stable. The left upper lobe lesion has an area of cavitation, stable. No new parenchymal lung lesion identified. There is a new rather minimal left pleural effusion. No change in cardiac silhouette. No pneumothorax. Electronically Signed   By: Lowella Grip III M.D.   On: 06/20/2015 20:15   Objective  Filed Vitals:   06/21/15 1145 06/21/15  1424 06/21/15 2201 06/22/15 0602  BP: 137/67 135/72 127/61 133/72  Pulse: 105 101 102 108  Temp: 97.8 F (36.6 C) 97.7 F (36.5 C) 97.9 F (36.6 C) 97.8 F (36.6 C)  TempSrc: Axillary Axillary Oral Oral  Resp: '16 20 20 18  '$ Height:      Weight:      SpO2: 98% 99% 98% 97%    Intake/Output  Summary (Last 24 hours) at 06/22/15 1236 Last data filed at 06/22/15 0700  Gross per 24 hour  Intake    511 ml  Output    875 ml  Net   -364 ml   Filed Weights   06/20/15 1924 06/21/15 0117  Weight: 75.751 kg (167 lb) 75.8 kg (167 lb 1.7 oz)   Exam:  GENERAL: NAD, appears weak, pale  HEENT: no scleral icterus  NECK: supple, no LAD  LUNGS: CTA biL, no wheezing  HEART: RRR without MRG  ABDOMEN: soft, non tender  EXTREMITIES: no clubbing / cyanosis. RUE and RLE with swelling  NEUROLOGIC: right sided weakness upper and lower extremity  Data Reviewed: Basic Metabolic Panel:  Recent Labs Lab 06/20/15 2025 06/21/15 0310 06/22/15 0545  NA 135 135 139  K 3.5 3.2* 3.5  CL 102 105 107  CO2 '24 23 24  '$ GLUCOSE 152* 94 112*  BUN '12 12 10  '$ CREATININE 0.74 0.67 0.62  CALCIUM 7.9* 7.6* 7.9*  MG  --  1.5*  --   PHOS  --  2.8  --    Liver Function Tests:  Recent Labs Lab 06/20/15 2025 06/21/15 0310 06/22/15 0545  AST '20 15 17  '$ ALT 9* 8* 8*  ALKPHOS 71 65 68  BILITOT 0.5 0.6 0.7  PROT 6.1* 5.5* 5.7*  ALBUMIN 1.7* 1.5* 1.6*    Recent Labs Lab 06/20/15 2304  AMMONIA 11   CBC:  Recent Labs Lab 06/20/15 2025 06/21/15 0310 06/21/15 0855 06/22/15 0545  WBC 4.2 3.5* 4.3 7.2  NEUTROABS 3.0  --   --   --   HGB 7.2* 6.1* 7.6* 8.7*  HCT 23.1* 19.8* 23.5* 27.5*  MCV 100.9* 102.1* 99.2 96.5  PLT 61* 42* 55* 77*   Cardiac Enzymes:  Recent Labs Lab 06/21/15 0310 06/21/15 0855 06/21/15 1525  TROPONINI <0.03 <0.03 <0.03   Scheduled Meds: . cefTRIAXone (ROCEPHIN)  IV  1 g Intravenous Q24H  . feeding supplement (ENSURE ENLIVE)  237 mL Oral BID BM  . guaiFENesin  600 mg Oral BID  . levothyroxine  150 mcg Oral QAC breakfast  . senna-docusate  1 tablet Oral Daily  . sodium chloride  3 mL Intravenous Q12H   Continuous Infusions:   Marzetta Board, MD [facility] Pager [phone]. If 7 PM - 7 AM, please contact night-coverage at [email or web address], password  [facility] 06/22/2015, 12:36 PM  LOS: 1 day

## 2015-06-23 ENCOUNTER — Inpatient Hospital Stay (HOSPITAL_COMMUNITY): Payer: BC Managed Care – PPO

## 2015-06-23 DIAGNOSIS — E440 Moderate protein-calorie malnutrition: Secondary | ICD-10-CM | POA: Insufficient documentation

## 2015-06-23 LAB — BASIC METABOLIC PANEL
ANION GAP: 10 (ref 5–15)
BUN: 9 mg/dL (ref 6–20)
CHLORIDE: 106 mmol/L (ref 101–111)
CO2: 24 mmol/L (ref 22–32)
Calcium: 8.1 mg/dL — ABNORMAL LOW (ref 8.9–10.3)
Creatinine, Ser: 0.59 mg/dL (ref 0.44–1.00)
GFR calc Af Amer: >60 mL/min (ref >60)
GLUCOSE: 102 mg/dL — AB (ref 65–99)
POTASSIUM: 3.9 mmol/L (ref 3.5–5.1)
Sodium: 140 mmol/L (ref 135–145)

## 2015-06-23 LAB — CBC
HEMATOCRIT: 28 % — AB (ref 36.0–46.0)
HEMOGLOBIN: 9 g/dL — AB (ref 12.0–15.0)
MCH: 31.8 pg (ref 26.0–34.0)
MCHC: 32.1 g/dL (ref 30.0–36.0)
MCV: 98.9 fL (ref 78.0–100.0)
Platelets: 86 10*3/uL — ABNORMAL LOW (ref 150–400)
RBC: 2.83 MIL/uL — ABNORMAL LOW (ref 3.87–5.11)
RDW: 21.9 % — ABNORMAL HIGH (ref 11.5–15.5)
WBC: 10.6 10*3/uL — ABNORMAL HIGH (ref 4.0–10.5)

## 2015-06-23 MED ORDER — OXYCODONE-ACETAMINOPHEN 5-325 MG PO TABS: 2.0000 | ORAL_TABLET | Freq: Once | ORAL | Status: DC

## 2015-06-23 MED ORDER — CETYLPYRIDINIUM CHLORIDE 0.05 % MT LIQD
7.0000 mL | Freq: Two times a day (BID) | OROMUCOSAL | Status: DC
  Administered 2015-06-23 – 2015-06-24 (×2): 7 mL via OROMUCOSAL

## 2015-06-23 MED ORDER — CHLORHEXIDINE GLUCONATE 0.12 % MT SOLN
15.0000 mL | Freq: Two times a day (BID) | OROMUCOSAL | Status: DC
  Administered 2015-06-23 – 2015-06-24 (×3): 15 mL via OROMUCOSAL
  Filled 2015-06-23 (×2): qty 15

## 2015-06-23 MED ORDER — MORPHINE SULFATE (PF) 4 MG/ML IV SOLN
4.0000 mg | Freq: Once | INTRAVENOUS | Status: AC
  Administered 2015-06-23: 4 mg via INTRAVENOUS
  Filled 2015-06-23: qty 1

## 2015-06-23 MED ORDER — SODIUM CHLORIDE 0.9 % IV BOLUS (SEPSIS)
500.0000 mL | Freq: Once | INTRAVENOUS | Status: AC
  Administered 2015-06-23: 500 mL via INTRAVENOUS

## 2015-06-23 MED ORDER — GADOBENATE DIMEGLUMINE 529 MG/ML IV SOLN
15.0000 mL | Freq: Once | INTRAVENOUS | Status: AC | PRN
  Administered 2015-06-23: 15 mL via INTRAVENOUS

## 2015-06-23 NOTE — Progress Notes (Signed)
Patient continued to be very restless during the night. Around 5 am the patient started to complain of pain in her back at a rate of 10/10. Tramadol not controlling the pain. Call placed to on-call provider. Order received to give Morphine 4 mg IV. Patient resting comfortably after relieving the pain in her back but placing her on her side and administering morphine.

## 2015-06-23 NOTE — Progress Notes (Signed)
BSW Intern continuing to follow.   BSW Intern met with pt and pt family at bedside. Pt husband present. BSW Intern provided pt with bed offers from [facility] search.  BSW Intern inquired about discussing pt medical state with MD. Pt and pt husband still undecided due to pt medical state. Pt scheduled for an MRI today.   BSW Intern followed-up with pt and pt family this afternoon. Pt family still awaiting results from MRI to decide pt's level of care needed. Pt family still considering SNF as an option.  CSW to continue to follow regarding SNF decision and provide support to pt and pt family.  Harlon Flor, [place] Intern Clinical Social Work Department  [phone]

## 2015-06-23 NOTE — Progress Notes (Signed)
PROGRESS NOTE  Christina Hale DPO:[id number] DOB: Apr 22, 1951 DOA: 06/20/2015 PCP: Sheela Stack, MD  HPI: 65 yo F with history of lung cancer metastasized to the brain and liver currently on chemotherapy, admitted 1/14 pm with weakness, intermittent confusion and profound anemia.   Interval events - Patient was admitted on 1/14 with profound weakness, found to be anemic and pancytopenic due to recent chemotherapy. She was transfused 2 units of packed red blood cells with subsequent improvement in her hemoglobin. Her white count as well as her platelets continued to gradually improve over the last couple days. Patient has been having intermittent confusion at home, she was clear on 1/16. On the night of 1/16 towards 1/17, she was complaining of increased back pain and she received a dose of IV morphine to help alleviate the pain. On 1/17 in the morning, patient was found to be extremely confused, she thinks he is at home, she is alert however.  Subjective / 24 H  - Confused this morning, she is alert to person only, she has hard time recognizing her daughter as well as Dr. Benay Spice or eye.  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  - She was clear yesterday morning, however she's more confused this morning, I wonder whether this is related to her morphine received overnight versus progression of her intracranial metastasis. She did have a CT scan of the head is outpatient about a week ago, however that may not be a sensitive - We'll obtain an MRI today - Continue ceftriaxone for urinary tract infection  Pancytopenia - patient with worsening anemia, leukopenia and thrombocytopenia, likely  in the setting of recent chemotherapy - transfused 2U pRBC total, hemoglobin improved after that and has remained stable - leukopenia resolved, WBC 10.6 today - platelets improving 42 >> 77 >> 86 - closely monitor for bleeding - discussed with Dr. Benay Spice this morning as well  Metastatic lung cancer - to the brain and liver - patient with residual right sided weakness and facial droop - focal weakness at baseline per patient and daughter - she underwent a CT scan of the brain as an outpatient on 1/3 without acute findings or evidence of progression - outpatient management - Obtain an MRI of the brain today as explained above  Dehydration / weakness - s/p hydration, off fluids today, encourage diet  - PT recommending SNF, SW consulted  Malnutrition  - nutrition support  Hypokalemia - replete and monitor, due to poor po intake - Potassium 3.9 this morning  Possible UTI - Ceftriaxone, urine cultures pending  Hyperthyroidism - status post radioactive iodine treatment on 08/21/2014 - continue synthroid - TSH 7.3   Diet: Diet regular Room service appropriate?: Yes; Fluid consistency:: Thin Fluids: NS DVT Prophylaxis: SCD   Code Status: DNR Family Communication: d/w daughter at bedside Disposition Plan: remain inpatient, SNF pending clearance of her mental status Barriers to discharge: pancytopenia, IV therapies  Consultants:  None   Procedures:  None    Antibiotics Ceftriaxone 1/15 >>    Studies  No results found. Objective  Filed Vitals:   06/22/15 0602 06/22/15 1500 06/22/15 2213 06/23/15 0512  BP: 133/72 130/68 135/60 118/59  Pulse: 108 108 113 125  Temp:  97.8 F (36.6 C) 97.6 F (36.4 C) 98.7 F (37.1 C) 97.5 F (36.4 C)  TempSrc: Oral Axillary Axillary Axillary  Resp: '18 20 20 20  '$ Height:      Weight:      SpO2: 97% 98% 97% 95%    Intake/Output Summary (Last 24 hours) at 06/23/15 1047 Last data filed at 06/23/15 0513  Gross per 24 hour    Intake    120 ml  Output   1300 ml  Net  -1180 ml   Filed Weights   06/20/15 1924 06/21/15 0117  Weight: 75.751 kg (167 lb) 75.8 kg (167 lb 1.7 oz)   Exam:  GENERAL: NAD, appears weak, pale  HEENT: no scleral icterus  NECK: supple, no LAD  LUNGS: CTA biL, no wheezing  HEART: RRR without MRG  ABDOMEN: soft, non tender  EXTREMITIES: no clubbing / cyanosis. RUE and RLE with swelling  NEUROLOGIC: right sided weakness upper and lower extremity. Alert and oriented 1  Data Reviewed: Basic Metabolic Panel:  Recent Labs Lab 06/20/15 2025 06/21/15 0310 06/22/15 0545 06/23/15 0343  NA 135 135 139 140  K 3.5 3.2* 3.5 3.9  CL 102 105 107 106  CO2 '24 23 24 24  '$ GLUCOSE 152* 94 112* 102*  BUN '12 12 10 9  '$ CREATININE 0.74 0.67 0.62 0.59  CALCIUM 7.9* 7.6* 7.9* 8.1*  MG  --  1.5*  --   --   PHOS  --  2.8  --   --    Liver Function Tests:  Recent Labs Lab 06/20/15 2025 06/21/15 0310 06/22/15 0545  AST '20 15 17  '$ ALT 9* 8* 8*  ALKPHOS 71 65 68  BILITOT 0.5 0.6 0.7  PROT 6.1* 5.5* 5.7*  ALBUMIN 1.7* 1.5* 1.6*    Recent Labs Lab 06/20/15 2304  AMMONIA 11   CBC:  Recent Labs Lab 06/20/15 2025 06/21/15 0310 06/21/15 0855 06/22/15 0545 06/23/15 0343  WBC 4.2 3.5* 4.3 7.2 10.6*  NEUTROABS 3.0  --   --   --   --   HGB 7.2* 6.1* 7.6* 8.7* 9.0*  HCT 23.1* 19.8* 23.5* 27.5* 28.0*  MCV 100.9* 102.1* 99.2 96.5 98.9  PLT 61* 42* 55* 77* 86*    Cardiac Enzymes:  Recent Labs Lab 06/21/15 0310 06/21/15 0855 06/21/15 1525  TROPONINI <0.03 <0.03 <0.03   Scheduled Meds: . antiseptic oral rinse  7 mL Mouth Rinse q12n4p  . cefTRIAXone (ROCEPHIN)  IV  1 g Intravenous Q24H  . chlorhexidine  15 mL Mouth Rinse BID  . guaiFENesin  600 mg Oral BID  . lactose free nutrition  237 mL Oral TID WC  . levothyroxine  150 mcg Oral QAC breakfast  . senna-docusate  1 tablet Oral Daily  . sodium chloride  500 mL Intravenous Once  . sodium chloride  3 mL Intravenous Q12H     Continuous Infusions:   Marzetta Board, MD [facility] Pager [phone]. If 7 PM - 7 AM, please contact night-coverage at [email or web address], password [facility] 06/23/2015, 10:47 AM  LOS: 2 days

## 2015-06-23 NOTE — Evaluation (Signed)
Occupational Therapy Evaluation Patient Details Name: Christina Hale MRN: [id number] DOB: 1951-01-15 Today's Date: 06/23/2015    History of Present Illness 65 yo female admitted with weakness, inability to ambulate, confusion, anemia. Hx of lung cancer with mets to brain, liver, R side weakness, facial droop.    Clinical Impression   Pt was admitted for the above. She will benefit from skilled OT to increase safety and independence with adls.  Pt needs max to total A +2 for bed level adls.  Husband was assisting pt more since Saturday. Goals in acute focus on bed mobilty, sitting eob and UB adls as well as cognition.    Follow Up Recommendations  SNF    Equipment Recommendations  None recommended by OT (will further assess for 3:1 commode)    Recommendations for Other Services       Precautions / Restrictions Precautions Precautions: Fall Restrictions Weight Bearing Restrictions: No      Mobility Bed Mobility               General bed mobility comments: rolled to L with max A using bedpad. Would need +2 assistance to safely sit EOB  Transfers                 General transfer comment: not tested    Balance                                            ADL Overall ADL's : Needs assistance/impaired                                       General ADL Comments: Pt able to use LUE for grooming tasks:  min A face, max A for other grooming tasks.  Total A for ADLs; needs +2 assist for LB.  Rolled to side, but pt unable to hold.  Did not roll to R as husband reports she is sore around port when she does this     Vision     Perception     Praxis      Pertinent Vitals/Pain Faces Pain Scale: Hurts even more Pain Location: R shoulder with movement Pain Intervention(s): Limited activity within patient's tolerance;Monitored during session;Premedicated before session     Hand Dominance     Extremity/Trunk Assessment Upper  Extremity Assessment Upper Extremity Assessment: RUE deficits/detail;Generalized weakness RUE Deficits / Details: decreased strength RUE.  R shoulder painful.  Tolerated 30 degrees PROM; elbow to fingers 2-/5 strength.  Min edema; positioned up on pillows.  Husband reports he has been doing this           Communication Communication Communication: No difficulties   Cognition Arousal/Alertness: Awake/alert Behavior During Therapy: WFL for tasks assessed/performed Overall Cognitive Status: Impaired/Different from baseline Area of Impairment: Memory;Problem solving;Following commands       Following Commands: Follows one step commands with increased time     Problem Solving: Requires tactile cues;Requires verbal cues General Comments: uncertain of baseline.  Pt confused when she came in.  Has brain mets   General Comments       Exercises       Shoulder Instructions      Home Living Family/patient expects to be discharged to:: Unsure Living Arrangements: Spouse/significant other Available Help at Discharge: Family  Additional Comments: husband states weakness started Saturday. He has been turning her in bed to assist with clothing and bedpan      Prior Functioning/Environment Level of Independence: Needs assistance        Comments: used walker prior to a few days ago    OT Diagnosis: Generalized weakness;Acute pain   OT Problem List: Decreased strength;Decreased cognition;Pain;Impaired UE functional use;Decreased range of motion;Decreased activity tolerance (balance NT)   OT Treatment/Interventions: Self-care/ADL training;Therapeutic exercise;DME and/or AE instruction;Therapeutic activities;Cognitive remediation/compensation;Patient/family education;Balance training    OT Goals(Current goals can be found in the care plan section) Acute Rehab OT Goals Patient Stated Goal: less pain. to feel better OT Goal Formulation: With  patient/family Time For Goal Achievement: 07/07/15 Potential to Achieve Goals: Fair ADL Goals Additional ADL Goal #1: pt will sit eob in preparation for adls with max +2 assistance and hold static balance with min A to min guard A for 3 minutes Additional ADL Goal #2: pt will roll to bil sides with mod A for LB adls Additional ADL Goal #3: pt will use LUE for grooming and UB adls at min A level Additional ADL Goal #4: pt will follow one step commands in context with no more than 5 second delay 80% of time  OT Frequency: Min 2X/week   Barriers to D/C:            Co-evaluation              End of Session    Activity Tolerance: Patient tolerated treatment well Patient left: in bed;with call bell/phone within reach;with family/visitor present   Time: 3762-8315 OT Time Calculation (min): 22 min Charges:  OT General Charges $OT Visit: 1 Procedure OT Evaluation $OT Eval Moderate Complexity: 1 Procedure G-Codes:    Abri Vacca 2015-07-09, 12:54 PM  Lesle Chris, OTR/L [phone] 07/09/2015

## 2015-06-23 NOTE — Progress Notes (Signed)
IP PROGRESS NOTE  Subjective:   Ms. Lacap is alert. She denies pain. He appears confused.  Objective: Vital signs in last 24 hours: Blood pressure 124/61, pulse 110, temperature 98.8 F (37.1 C), temperature source Oral, resp. rate 20, height 5' 10"  (1.778 m), weight 167 lb 1.7 oz (75.8 kg), SpO2 99 %.  Intake/Output from previous day: 01/16 0701 - 01/17 0700 In: 180 [P.O.:180] Out: 1300 [Urine:1300]  Physical Exam:  HEENT: No thrush Lungs: Clear bilaterally Cardiac: Regular rate and rhythm Abdomen: No hepatomegaly, nontender Extremities: Mild edema at the right lower arm and hand. Trace pitting edema at the right greater than left lower leg with chronic stasis change Neurologic: , Not oriented to place, follows commands, decreased strength of the right arm and leg  Portacath/PICC-without erythema  Lab Results:  Recent Labs  06/22/15 0545 06/23/15 0343  WBC 7.2 10.6*  HGB 8.7* 9.0*  HCT 27.5* 28.0*  PLT 77* 86*    BMET  Recent Labs  06/22/15 0545 06/23/15 0343  NA 139 140  K 3.5 3.9  CL 107 106  CO2 24 24  GLUCOSE 112* 102*  BUN 10 9  CREATININE 0.62 0.59  CALCIUM 7.9* 8.1*    Studies/Results: No results found.  Medications: I have reviewed the patient's current medications.  Assessment/Plan:  1. Stage IV adenocarcinoma of the lung, dominant left upper lobe mass and spiculated right upper lobe mass  Status post a biopsy of a left hepatic mass confirming metastatic adenocarcinoma consistent with a lung primary, ALK mutation negative, EGFR amplification suboptimal-repeat testing recommend   Staging CT scans 09/27/2013 confirmed a dominant left upper lung mass, a smaller right upper lung mass, left hepatic mass, and left external iliac node.   Cycle 1 Taxol/carboplatin and Avastin per the [facility] J4782 study 10/09/2013.   Cycle 2 Taxol/carboplatin and Avastin 10/29/2013.   Restaging CT evaluation 11/14/2013 with improvement in the lungs and  liver. Left external iliac lymph node was stable.   Cycle 3 Taxol/carboplatin and Avastin 12/02/2013   Restaging CT 01/02/2014 with a mixed response-slightly larger left upper lung mass and mediastinal lymph node, slightly smaller liver mass and iliac node   Maintenance Avastin beginning 01/06/2014   Restaging CT 03/06/2014 with a decrease in the size of the lung masses and a liver lesion  Restaging CT 05/22/2014 with a stable right upper lung nodule, decreased size of a left upper lobe lung mass, increased size of a liver lesion, stable left pelvic nodule/node.  Restaging CT 07/24/2014 with no significant change in the chest, increase in size of the mass within the left hepatic lobe, 2 retroperitoneal lymph nodes increased in size, stable appearance of peritoneal nodules.  brain/ skull metastases documented on an MRI of the brain/cervical spine 09/23/2014, taken off of the [id number] study  Restaging CTs 10/09/2014 -slight decrease in size of the left upper lobe cavitary mass , enlargement of the dominant left liver lesion, no significant change in the size of abdominal or chest lymph nodes except for enlargement of a subcarinal node  Cycle 1 Alimta/Avastin 10/20/2014  Cycle 2 Alimta/Avastin 11/10/2014  Cycle 3 Alimta/Avastin 12/01/2014  Cycle 4 Alimta/Avastin 12/22/2014  Cycle 5 Alimta/Avastin 01/12/2015  Cycle 6 Alimta/Avastin 02/02/2015  MRI brain 01/08/2015 with regression of all 5 brain metastases. 3 still visible. Bone metastases not significantly changed.  Restaging CTs 02/19/2015 with stable left upper lobe cavitary mass, stable right upper lobe lobular nodule, stable left hepatic lobe metastatic lesion. No evidence of disease progression in the  abdomen or pelvis.  Cycle 7 Alimta/Avastin 02/23/2015  Cycle 8 Alimta/Avastin 03/16/2015  Cycle 9 Alimta/Avastin 04/06/2015  Cycle 10 Alimta/Avastin 04/27/2015  Restaging CT of the chest abdomen and pelvis performed on  05/07/2015 showed an essentially stable left upper lobe paramediastinal mass. Potential new mild thickening in the mediastinal pleural surface in the anterior mediastinum left. Mild increase in subcarinal lymph node size. Prevascular lymph nodes are not changed. Stable right upper lobe pulmonary nodule. The round lesion in the left lateral hepatic lobe is slightly contraction side. No new hepatic lesions. Perineal nodular metastasis similar compared to CT of 10/09/2014 and 02/19/2015.  Cycle 11 Alimta/Avastin 05/18/2015 2. C. difficile colitis March 2015 3. Hyponatremia/seizure on hospital admission 08/29/2013 4. Chronic right hip "bursitis". 5. Nausea and vomiting following cycle 1 Taxol/carboplatin, the anti-emetic regimen was adjusted with cycle 2. 6. Severe neutropenia following cycle 1 Taxol/carboplatin-Neulasta added with cycle 2. 7. 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 9. Anemia. Likely secondary to chemotherapy, chronic disease, and malnutrition versus bone marrow metastases. She received red cell transfusions 12/20/2013, 12/21/2013, and 06/21/2015 10. Hoarseness-improved, potentially related to toxicity from chemotherapy versus tumor. 11. Right retinal detachment 04/05/2014, status post surgical repair 04/07/2014 12. Low TSH with a borderline elevated T4- she was diagnosed with hyperthyroidism, status post radioactive iodine treatment on 08/21/2014 , started on thyroid hormone replacement by Dr. Forde Dandy 13. History of Proteinuria-mild 14. Decreased right nasolabial fold and right arm/leg weakness 09/22/2014  MRI brain/cervical spine 09/23/2014 revealed 5 brain metastases including a 3.1 cm brainstem metastasis in the left pons, 2 left posterior skull metastases, and a C1 metastasis  Decadron started 09/23/2014 a tapered to off  MRI brain 01/08/2015 with regression of all 5 brain metastases. 3 still visible. Bone metastases not  significantly changed.  CT brain 06/09/2015-no acute finding, no evidence of disease progression 15. Neck pain, pain with swallowing-likely related to thyroid inflammation from I-131 therapy, improved with Decadron 16. Bilateral leg edema. Bilateral lower extremity venous Doppler negative 02/02/2015. Megace and Norvasc discontinued 02/23/2015. Leg edema markedly improved 03/16/2015. 17. Urine culture 02/23/2015 with Escherichia coli. She completed a course of ciprofloxacin. 18. Facial rash-the differential diagnosis includes acne, rosacea, a paraneoplastic rash, or rash secondary to a collagen vascular disorder 19. Thrombocytopenia secondary to chemotherapy versus bone marrow involvement with non-small cell lung cancer-improved 20. Anorexia/failure to thrive-likely secondary to non-small cell lung cancer versus sequelae of brain radiation  Ms. Verret is confused this morning. This may be related to "sundowning" or polypharmacy. 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 The thrombocytopenia is improved today.  Recommendations: 1. Skilled nursing facility placement 2. Follow-up as scheduled at the Cancer center 06/29/2015 3. Hold CNS acting agents as possible, follow-up brain MRI   I discussed the case with her daughter at the bedside today. She understands we may recommend Hospice care if her performance status does not improve.    LOS: 2 days   Tramain Gershman  06/23/2015, 3:07 PM

## 2015-06-23 NOTE — Progress Notes (Signed)
Resumed care of patient. Agree with previous assessment. Orders reviewed. Will continue to monitor. 

## 2015-06-24 ENCOUNTER — Inpatient Hospital Stay (HOSPITAL_COMMUNITY): Payer: BC Managed Care – PPO

## 2015-06-24 DIAGNOSIS — D61818 Other pancytopenia: Principal | ICD-10-CM

## 2015-06-24 DIAGNOSIS — E440 Moderate protein-calorie malnutrition: Secondary | ICD-10-CM

## 2015-06-24 DIAGNOSIS — R410 Disorientation, unspecified: Secondary | ICD-10-CM

## 2015-06-24 DIAGNOSIS — I10 Essential (primary) hypertension: Secondary | ICD-10-CM

## 2015-06-24 MED ORDER — HEPARIN SOD (PORK) LOCK FLUSH 100 UNIT/ML IV SOLN
500.0000 [IU] | INTRAVENOUS | Status: AC | PRN
  Administered 2015-06-24: 500 [IU]

## 2015-06-24 NOTE — Progress Notes (Signed)
Pt's husband changed and now want HHRN, PT with [facility].  Referral given to in house rep.

## 2015-06-24 NOTE — Discharge Summary (Signed)
Physician Discharge Summary  Christina Hale QIW:[id number] DOB: 10-16-1950 DOA: 06/20/2015  PCP: Sheela Stack, MD  Admit date: 06/20/2015 Discharge date: 06/24/2015  Time spent: 20 minutes  Recommendations for Outpatient Follow-up:  1. Follow up with PCP in 2-3 weeks 2. Follow up with Dr. 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 Briefly 65 yo F with history of lung cancer metastasized to the brain and liver currently on chemotherapy, admitted 1/14 pm with weakness, intermittent confusion and profound anemia  Hospital Course:  Acute encephalopathy  - likely multifactorial will hold Remeron as this likely contributed to her increased confusion. Discussed case with Dr. Benay Spice. Also consideration for mental status change secondary to radiation tx. - MRI without new brain mets - While admitted patient was continued on ceftriaxone for urinary tract infection, however, UA and urine culture unremarkable, thus would hold off on further abx  Pancytopenia - patient with worsening anemia, leukopenia and thrombocytopenia, likely in the setting of recent chemotherapy - transfused 2U pRBC total, hemoglobin improved after that and has remained stable - leukopenia resolved - platelets improved - discussed with Dr. Benay Spice this AM. Pt is cleared for d/c per Dr. Benay Spice. Pt to follow up closely as outpatient  Metastatic lung cancer - to the brain and liver - patient with residual right sided weakness and facial droop - focal weakness at  baseline per patient and family - she underwent a CT scan of the brain as an outpatient on 1/3 without acute findings or evidence of progression - outpatient management  Dehydration / weakness - s/p hydration, off fluids today, encourage diet  - PT recommending SNF, SW consulted  Malnutrition  - nutrition support  Hypokalemia - replete and monitor, due to poor po intake - 1/17 potassium 3.9   Possible UTI - Ceftriaxone empirically - UA and urine cx unremarkable  Hyperthyroidism - status post radioactive iodine treatment on 08/21/2014 - continue synthroid - TSH 7.3  Bladder outlet obstruction - Per pt and family, pt noted to have evidence of bladder outlet obstruction this admission improved with indwelling foley cath - For now, would continue foley catheter on discharge - Recommend outpatient Urology follow up for bladder training  R Arm Pain - Obtained xrays of R shoulder. Neg for fracture  Procedures:    Consultations:  Oncology  Discharge Exam: Filed Vitals:   06/23/15 1309 06/23/15 2225 06/24/15 0513 06/24/15 1515  BP: 124/61 129/71 131/78 138/87  Pulse: 110 113 110 134  Temp: 98.8 F (37.1 C) 97.9 F (36.6 C) 98.1 F (36.7 C) 97.4 F (36.3 C)  TempSrc: Oral Oral Oral Oral  Resp: '20 22 20 20  '$ Height:      Weight:      SpO2: 99% 97% 98% 98%    General: awake, in nad Cardiovascular: regular, s1, s2 Respiratory: normal resp effort, no wheezing  Discharge Instructions     Medication List    STOP taking these medications        ciprofloxacin 500 MG tablet  Commonly known as:  CIPRO     megestrol 40 MG/ML suspension  Commonly known as:  MEGACE     mirtazapine 15 MG tablet  Commonly known as:  REMERON      TAKE these medications        albuterol 108 (90 Base) MCG/ACT inhaler  Commonly known as:  PROVENTIL HFA;VENTOLIN HFA  Inhale 1-2 puffs into the lungs every 6 (six) hours as needed for wheezing or shortness of breath.      ALPRAZolam 0.25 MG tablet  Commonly known as:  XANAX  Take 1 tablet (0.25 mg total) by mouth 3 (three) times daily as needed for anxiety.     clindamycin 1 % gel  Commonly known as:  CLINDAGEL  Apply topically 2 (two) times daily.     folic acid 1 MG tablet  Commonly known as:  FOLVITE  TAKE 1 TABLET (1 MG TOTAL) BY MOUTH DAILY.     magic mouthwash w/lidocaine Soln  Take 5 mLs by mouth 4 (four) times daily as needed for mouth pain (swish for 1 minute, then spit).     multivitamin with minerals Tabs tablet  Take 1 tablet by mouth daily.     ondansetron 4 MG tablet  Commonly known as:  ZOFRAN  Take 1 tablet (4 mg total) by mouth daily as needed for nausea or vomiting.     senna-docusate 8.6-50 MG tablet  Commonly known as:  SENNA S  Take 1 tablet by mouth daily.     SYNTHROID 150 MCG tablet  Generic drug:  levothyroxine  TAKE 1 TABLET BY MOUTH DAILY EXCEPT TAKE 1 & 1/2 TABLETS BY MOUTH ON SUNDAY     traMADol 50 MG tablet  Commonly known as:  ULTRAM  Take 1-2 tablets (50-100 mg total) by mouth every 8 (eight) hours as needed.       Allergies  Allergen Reactions  . Contrast Media [Iodinated Diagnostic Agents] Other (See Comments)    Patient stated," I don't remember what kind of reaction I had, it was a long time ago. I was told not to take it again."  . Codeine Nausea Only  . Prednisolone Nausea Only      The results of significant diagnostics from this hospitalization (including imaging, microbiology, ancillary and laboratory) are listed below for reference.    Significant Diagnostic Studies: Dg Elbow Complete Right  06/24/2015  CLINICAL DATA:  65 year old female status post blunt trauma to the right elbow 4 days ago with skin tear. Continued pain. Initial encounter. EXAM: RIGHT ELBOW - COMPLETE 3+ VIEW COMPARISON:  None. FINDINGS: Diffuse soft tissue swelling and increased density. No subcutaneous gas identified. Joint spaces and alignment are maintained at the right  elbow. No definite joint effusion. The radial head appears intact. No acute fracture or dislocation identified. IMPRESSION: Diffuse abnormal soft tissue swelling. No acute fracture or dislocation identified about the right elbow. Electronically Signed   By: Genevie Ann M.D.   On: 06/24/2015 13:35   Ct Head W Wo Contrast  06/09/2015  CLINICAL DATA:  Metastatic lung cancer with brain metastases and increased right-sided weakness. EXAM: CT HEAD WITHOUT AND WITH CONTRAST TECHNIQUE: Contiguous axial images were obtained from the base of the skull through the vertex without and with intravenous contrast CONTRAST:  51m OMNIPAQUE IOHEXOL 300 MG/ML  SOLN COMPARISON:  Brain MRI 04/16/2015 FINDINGS: Skull and Sinuses:Left retromastoid ground-glass bone lesion has a stable appearance. CT appearance favors fibrous dysplasia. No acute osseous finding or bone destruction. Large  mucous retention cysts in the left maxillary antrum. No acute sinusitis. 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 Brain: Aside from low-density in the left pons, known/treated brain metastases are not visualized. There is no evidence of hemorrhagic, new mass or progressive brain metastasis. No evidence of acute infarct, hydrocephalus, or major vessel occlusion. Chronic small vessel disease seen in the deep cerebral white matter, stable. IMPRESSION: No acute finding or evidence of progression. Treated brain metastases are underestimated relative to MRI comparison. Electronically Signed   By: Monte Fantasia M.D.   On: 06/09/2015 12:47   Mr Jeri Cos XV Contrast  06/23/2015  CLINICAL DATA:  Increased confusion and right-sided weakness. Lung cancer with brain metastases. EXAM: MRI HEAD WITHOUT AND WITH CONTRAST TECHNIQUE: Multiplanar, multiecho pulse sequences of the brain and surrounding structures were obtained without and with intravenous contrast. CONTRAST:  15 mL MultiHance COMPARISON:   Head CT 06/09/2015 and MRI 04/16/2015 FINDINGS: There is no evidence of acute infarct, midline shift, or extra-axial fluid collection. There is mild global cerebral atrophy. Predominantly periventricular cerebral white matter T2 hyperintensities are unchanged from the prior MRI and nonspecific. The treated lesion in the left pons again demonstrates chronic blood products. The lesion measures approximately 7 mm on T2 weighted images and now only demonstrates faint residual enhancement, decreased from the prior MRI (series 12, image 16). Osseous metastasis posterior to the left mastoid air cells has not significantly changed. 3 mm left occipital lesion (series 12, image 28) is unchanged. The right hippocampal lesion is no longer identified. No new enhancing brain lesions are seen. Prior right scleral banding is noted. Mild mucosal thickening in a mucous retention cyst are present in the left maxillary sinus. There is also minimal right maxillary and bilateral ethmoid sinus mucosal thickening. A small left mastoid effusion is similar to the prior MRI. Major intracranial vascular flow voids are preserved. IMPRESSION: 1. No acute intracranial abnormality. 2. Unchanged left occipital metastasis. 3. Decreased enhancement of pontine metastasis. 4. Right hippocampal metastasis no longer identified. 5. No evidence of new intracranial metastases. 6. Unchanged osseous metastasis in the left mastoid/occipital region. Electronically Signed   By: Logan Bores M.D.   On: 06/23/2015 15:29   Dg Chest Port 1 View  06/20/2015  CLINICAL DATA:  Lung carcinoma.  Confusion.  Hypertension. EXAM: PORTABLE CHEST 1 VIEW COMPARISON:  Chest radiograph March 02, 2015. Chest CT May 07, 2015 FINDINGS: There is a spiculated lesion in the right upper lobe, essentially stable. There is a stable cavitary lesion in the right upper lobe medially extending into the right apex, unchanged. There is a minimal left pleural effusion, not present  previously. No new parenchymal lung opacity. Heart size is unremarkable. The pulmonary vascularity is stable and within normal limits given the underlying left upper lobe lesion. Port-A-Cath tip is in the superior vena cava. No pneumothorax. No adenopathy is apparent radiographically. IMPRESSION: Bilateral upper lobe lesions, much larger on the left than on the right, stable. The left upper lobe lesion has an area of cavitation, stable. No new parenchymal lung lesion identified. There is a new rather minimal left pleural effusion. No change in cardiac silhouette. No pneumothorax. Electronically Signed   By: Lowella Grip III M.D.   On: 06/20/2015 20:15    Microbiology: Recent Results (from the past 240 hour(s))  Urine culture     Status: None   Collection Time: 06/20/15  9:15 PM  Result Value Ref Range Status  Specimen Description URINE, CATHETERIZED  Final   Special Requests NONE  Final   Culture   Final    3,000 COLONIES/mL INSIGNIFICANT GROWTH Performed at [facility]    Report Status 06/22/2015 FINAL  Final     Labs: Basic Metabolic Panel:  Recent Labs Lab 06/20/15 2025 06/21/15 0310 06/22/15 0545 06/23/15 0343  NA 135 135 139 140  K 3.5 3.2* 3.5 3.9  CL 102 105 107 106  CO2 '24 23 24 24  '$ GLUCOSE 152* 94 112* 102*  BUN '12 12 10 9  '$ CREATININE 0.74 0.67 0.62 0.59  CALCIUM 7.9* 7.6* 7.9* 8.1*  MG  --  1.5*  --   --   PHOS  --  2.8  --   --    Liver Function Tests:  Recent Labs Lab 06/20/15 2025 06/21/15 0310 06/22/15 0545  AST '20 15 17  '$ ALT 9* 8* 8*  ALKPHOS 71 65 68  BILITOT 0.5 0.6 0.7  PROT 6.1* 5.5* 5.7*  ALBUMIN 1.7* 1.5* 1.6*   No results for input(s): LIPASE, AMYLASE in the last 168 hours.  Recent Labs Lab 06/20/15 2304  AMMONIA 11   CBC:  Recent Labs Lab 06/20/15 2025 06/21/15 0310 06/21/15 0855 06/22/15 0545 06/23/15 0343  WBC 4.2 3.5* 4.3 7.2 10.6*  NEUTROABS 3.0  --   --   --   --   HGB 7.2* 6.1* 7.6* 8.7* 9.0*  HCT 23.1*  19.8* 23.5* 27.5* 28.0*  MCV 100.9* 102.1* 99.2 96.5 98.9  PLT 61* 42* 55* 77* 86*   Cardiac Enzymes:  Recent Labs Lab 06/21/15 0310 06/21/15 0855 06/21/15 1525  TROPONINI <0.03 <0.03 <0.03   BNP: BNP (last 3 results) No results for input(s): BNP in the last 8760 hours.  ProBNP (last 3 results) No results for input(s): PROBNP in the last 8760 hours.  CBG: No results for input(s): GLUCAP in the last 168 hours.    Signed:  Skarlet Lyons, Orpah Melter  [facility] 06/24/2015, 4:58 PM

## 2015-06-24 NOTE — Progress Notes (Signed)
Spoke with pt's husband at bedside who is also HCPOA concerning [place].  Mr. Breit state that he will take pt home with no HH at present time, he will call PCP if he feel that pt will need HH. Mr. Berroa also states that he is ready to take her home today.

## 2015-06-24 NOTE — Clinical Social Work Placement (Signed)
CSW spoke with patient's husband, Eduard Clos at bedside re: discharge planning. Husband plans to take patient home at discharge rather than SNF. Dr. Wyline Copas & RNCM, Cookie aware. Husband states he feels comfortable transporting her home.   No further CSW needs identified - CSW signing off.   Raynaldo Opitz, [facility] Clinical Social Worker cell #: [phone]   CLINICAL SOCIAL WORK PLACEMENT  NOTE  Date:  06/24/2015  Patient Details  Name: Christina Hale MRN: [id number] Date of Birth: 1950/08/05  Clinical Social Work is seeking post-discharge placement for this patient at the [place] level of care (*CSW will initial, date and re-position this form in  chart as items are completed):  Yes   Patient/family provided with [facility] Work Department's list of facilities offering this level of care within the geographic area requested by the patient (or if unable, by the patient's family).  Yes   Patient/family informed of their freedom to choose among providers that offer the needed level of care, that participate in Medicare, Medicaid or managed care program needed by the patient, have an available bed and are willing to accept the patient.  Yes   Patient/family informed of [facility]'s ownership interest in [facility] and [facility], as well as of the fact that they are under no obligation to receive care at these facilities.  PASRR submitted to EDS on       PASRR number received on 06/22/15     Existing PASRR number confirmed on       FL2 transmitted to all facilities in geographic area requested by pt/family on 06/22/15     FL2 transmitted to all facilities within larger geographic area on 06/22/15     Patient informed that his/her managed care company has contracts with or will negotiate with certain facilities, including the following:        Yes   Patient/family informed of bed offers received.  Patient chooses  bed at       Physician recommends and patient chooses bed at      Patient to be transferred to   on  .  Patient to be transferred to facility by       Patient family notified on   of transfer.  Name of family member notified:        PHYSICIAN       Additional Comment:    _______________________________________________ Standley Brooking, LCSW 06/24/2015, 10:32 AM

## 2015-06-24 NOTE — Progress Notes (Signed)
IP PROGRESS NOTE  Subjective:   Ms. Calligan is alert. She remains emotional and mildly confused. No pain. She wants to go home. She had a firm bowel movement last night.  Objective: Vital signs in last 24 hours: Blood pressure 131/78, pulse 110, temperature 98.1 F (36.7 C), temperature source Oral, resp. rate 20, height 5' 10"  (1.778 m), weight 167 lb 1.7 oz (75.8 kg), SpO2 98 %.  Intake/Output from previous day: 01/17 0701 - 01/18 0700 In: 510 [P.O.:360; IV Piggyback:150] Out: 850 [Urine:850]  Physical Exam:  Abdomen: No hepatomegaly, nontender Extremities: Mild edema at the right lower arm and hand. Trace pitting edema at the right greater than left lower leg with chronic stasis change Neurologic: Alert, knows she is in the hospital, follows commands, decreased strength of the right arm and leg  Portacath/PICC-without erythema  Lab Results:  Recent Labs  06/22/15 0545 06/23/15 0343  WBC 7.2 10.6*  HGB 8.7* 9.0*  HCT 27.5* 28.0*  PLT 77* 86*    BMET  Recent Labs  06/22/15 0545 06/23/15 0343  NA 139 140  K 3.5 3.9  CL 107 106  CO2 24 24  GLUCOSE 112* 102*  BUN 10 9  CREATININE 0.62 0.59  CALCIUM 7.9* 8.1*    Studies/Results: Dg Elbow Complete Right  06/24/2015  CLINICAL DATA:  65 year old female status post blunt trauma to the right elbow 4 days ago with skin tear. Continued pain. Initial encounter. EXAM: RIGHT ELBOW - COMPLETE 3+ VIEW COMPARISON:  None. FINDINGS: Diffuse soft tissue swelling and increased density. No subcutaneous gas identified. Joint spaces and alignment are maintained at the right elbow. No definite joint effusion. The radial head appears intact. No acute fracture or dislocation identified. IMPRESSION: Diffuse abnormal soft tissue swelling. No acute fracture or dislocation identified about the right elbow. Electronically Signed   By: Genevie Ann M.D.   On: 06/24/2015 13:35   Mr Jeri Cos NW Contrast  06/23/2015  CLINICAL DATA:  Increased  confusion and right-sided weakness. Lung cancer with brain metastases. EXAM: MRI HEAD WITHOUT AND WITH CONTRAST TECHNIQUE: Multiplanar, multiecho pulse sequences of the brain and surrounding structures were obtained without and with intravenous contrast. CONTRAST:  15 mL MultiHance COMPARISON:  Head CT 06/09/2015 and MRI 04/16/2015 FINDINGS: There is no evidence of acute infarct, midline shift, or extra-axial fluid collection. There is mild global cerebral atrophy. Predominantly periventricular cerebral white matter T2 hyperintensities are unchanged from the prior MRI and nonspecific. The treated lesion in the left pons again demonstrates chronic blood products. The lesion measures approximately 7 mm on T2 weighted images and now only demonstrates faint residual enhancement, decreased from the prior MRI (series 12, image 16). Osseous metastasis posterior to the left mastoid air cells has not significantly changed. 3 mm left occipital lesion (series 12, image 28) is unchanged. The right hippocampal lesion is no longer identified. No new enhancing brain lesions are seen. Prior right scleral banding is noted. Mild mucosal thickening in a mucous retention cyst are present in the left maxillary sinus. There is also minimal right maxillary and bilateral ethmoid sinus mucosal thickening. A small left mastoid effusion is similar to the prior MRI. Major intracranial vascular flow voids are preserved. IMPRESSION: 1. No acute intracranial abnormality. 2. Unchanged left occipital metastasis. 3. Decreased enhancement of pontine metastasis. 4. Right hippocampal metastasis no longer identified. 5. No evidence of new intracranial metastases. 6. Unchanged osseous metastasis in the left mastoid/occipital region. Electronically Signed   By: Seymour Bars.D.  On: 06/23/2015 15:29    Medications: I have reviewed the patient's current medications.  Assessment/Plan:  1. Stage IV adenocarcinoma of the lung, dominant left upper  lobe mass and spiculated right upper lobe mass  Status post a biopsy of a left hepatic mass confirming metastatic adenocarcinoma consistent with a lung primary, ALK mutation negative, EGFR amplification suboptimal-repeat testing recommend   Staging CT scans 09/27/2013 confirmed a dominant left upper lung mass, a smaller right upper lung mass, left hepatic mass, and left external iliac node.   Cycle 1 Taxol/carboplatin and Avastin per the [facility] S9702 study 10/09/2013.   Cycle 2 Taxol/carboplatin and Avastin 10/29/2013.   Restaging CT evaluation 11/14/2013 with improvement in the lungs and liver. Left external iliac lymph node was stable.   Cycle 3 Taxol/carboplatin and Avastin 12/02/2013   Restaging CT 01/02/2014 with a mixed response-slightly larger left upper lung mass and mediastinal lymph node, slightly smaller liver mass and iliac node   Maintenance Avastin beginning 01/06/2014   Restaging CT 03/06/2014 with a decrease in the size of the lung masses and a liver lesion  Restaging CT 05/22/2014 with a stable right upper lung nodule, decreased size of a left upper lobe lung mass, increased size of a liver lesion, stable left pelvic nodule/node.  Restaging CT 07/24/2014 with no significant change in the chest, increase in size of the mass within the left hepatic lobe, 2 retroperitoneal lymph nodes increased in size, stable appearance of peritoneal nodules.  brain/ skull metastases documented on an MRI of the brain/cervical spine 09/23/2014, taken off of the E5508 study  Restaging CTs 10/09/2014 -slight decrease in size of the left upper lobe cavitary mass , enlargement of the dominant left liver lesion, no significant change in the size of abdominal or chest lymph nodes except for enlargement of a subcarinal node  Cycle 1 Alimta/Avastin 10/20/2014  Cycle 2 Alimta/Avastin 11/10/2014  Cycle 3 Alimta/Avastin 12/01/2014  Cycle 4 Alimta/Avastin 12/22/2014  Cycle 5 Alimta/Avastin  01/12/2015  Cycle 6 Alimta/Avastin 02/02/2015  MRI brain 01/08/2015 with regression of all 5 brain metastases. 3 still visible. Bone metastases not significantly changed.  Restaging CTs 02/19/2015 with stable left upper lobe cavitary mass, stable right upper lobe lobular nodule, stable left hepatic lobe metastatic lesion. No evidence of disease progression in the abdomen or pelvis.  Cycle 7 Alimta/Avastin 02/23/2015  Cycle 8 Alimta/Avastin 03/16/2015  Cycle 9 Alimta/Avastin 04/06/2015  Cycle 10 Alimta/Avastin 04/27/2015  Restaging CT of the chest abdomen and pelvis performed on 05/07/2015 showed an essentially stable left upper lobe paramediastinal mass. Potential new mild thickening in the mediastinal pleural surface in the anterior mediastinum left. Mild increase in subcarinal lymph node size. Prevascular lymph nodes are not changed. Stable right upper lobe pulmonary nodule. The round lesion in the left lateral hepatic lobe is slightly contraction side. No new hepatic lesions. Perineal nodular metastasis similar compared to CT of 10/09/2014 and 02/19/2015.  Cycle 11 Alimta/Avastin 05/18/2015 2. C. difficile colitis March 2015 3. Hyponatremia/seizure on hospital admission 08/29/2013 4. Chronic right hip "bursitis". 5. Nausea and vomiting following cycle 1 Taxol/carboplatin, the anti-emetic regimen was adjusted with cycle 2. 6. Severe neutropenia following cycle 1 Taxol/carboplatin-Neulasta added with cycle 2. 7. 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 9. Anemia. Likely secondary to chemotherapy, chronic disease, and malnutrition versus bone marrow metastases. She received red cell transfusions 12/20/2013, 12/21/2013, and 06/21/2015 10. Hoarseness-improved, potentially related to toxicity from chemotherapy versus tumor. 11. Right retinal detachment 04/05/2014, status post  surgical repair 04/07/2014 12. Low TSH with a borderline  elevated T4- she was diagnosed with hyperthyroidism, status post radioactive iodine treatment on 08/21/2014 , started on thyroid hormone replacement by Dr. Forde Dandy 13. History of Proteinuria-mild 14. Decreased right nasolabial fold and right arm/leg weakness 09/22/2014  MRI brain/cervical spine 09/23/2014 revealed 5 brain metastases including a 3.1 cm brainstem metastasis in the left pons, 2 left posterior skull metastases, and a C1 metastasis  Decadron started 09/23/2014 a tapered to off  MRI brain 01/08/2015 with regression of all 5 brain metastases. 3 still visible. Bone metastases not significantly changed.  CT brain 06/09/2015-no acute finding, no evidence of disease progression  MRI brain 06/23/2015-no evidence of disease progression 15. Neck pain, pain with swallowing-likely related to thyroid inflammation from I-131 therapy, improved with Decadron 16. Bilateral leg edema. Bilateral lower extremity venous Doppler negative 02/02/2015. Megace and Norvasc discontinued 02/23/2015. Leg edema markedly improved 03/16/2015. 17. Urine culture 02/23/2015 with Escherichia coli. She completed a course of ciprofloxacin. 18. Facial rash-the differential diagnosis includes acne, rosacea, a paraneoplastic rash, or rash secondary to a collagen vascular disorder 19. Thrombocytopenia secondary to chemotherapy versus bone marrow involvement with non-small cell lung cancer-improved 20. Anorexia/failure to thrive-likely secondary to non-small cell lung cancer versus sequelae of brain radiation  Ms. Pietsch has a poor performance status. 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 She is not a candidate for further chemotherapy unless her condition improves. I discussed Hospice care with her husband. He would like to consider resuming chemotherapy in the future. We decided to hold off on a hospice referral for now. Mr.  Heacock is comfortable taking her home.  Recommendations: 1. Home with physical therapy/occupational therapy evaluation and equipment from [facility] 2. Follow-up as scheduled at the Cancer center 06/29/2015    LOS: 3 days   Raeghan Demeter, Dominica Severin  06/24/2015, 3:14 PM

## 2015-06-25 ENCOUNTER — Telehealth: Payer: Self-pay | Admitting: *Deleted

## 2015-06-25 NOTE — Telephone Encounter (Signed)
Received call from daughter Zadie Rhine requesting to talk to Dr. Benay Spice about pt's condition.  Marissa stated she had missed MRI results discussed by Dr. Benay Spice yesterday prior to pt discharge home.   Marissa would like to know what MRI showed, and what Dr. Benay Spice had advised pt and husband.  Marissa stated her father just said " MRI results good ", and pt will receive home health - but not knowing much.  Marissa's name is on ROI. Marissa's   Phone    [phone].

## 2015-06-28 ENCOUNTER — Other Ambulatory Visit: Payer: Self-pay | Admitting: Oncology

## 2015-06-29 ENCOUNTER — Telehealth: Payer: Self-pay

## 2015-06-29 ENCOUNTER — Ambulatory Visit: Payer: BC Managed Care – PPO

## 2015-06-29 ENCOUNTER — Ambulatory Visit
Admission: RE | Admit: 2015-06-29 | Discharge: 2015-06-29 | Disposition: A | Discharge status: Home / Self Care | Payer: BC Managed Care – PPO | Source: Ambulatory Visit | Attending: Radiation Oncology | Admitting: Radiation Oncology

## 2015-06-29 ENCOUNTER — Telehealth: Payer: Self-pay | Admitting: Nurse Practitioner

## 2015-06-29 ENCOUNTER — Encounter: Payer: Self-pay | Admitting: Radiation Oncology

## 2015-06-29 ENCOUNTER — Ambulatory Visit (HOSPITAL_BASED_OUTPATIENT_CLINIC_OR_DEPARTMENT_OTHER): Payer: BC Managed Care – PPO | Admitting: Nurse Practitioner

## 2015-06-29 ENCOUNTER — Other Ambulatory Visit (HOSPITAL_BASED_OUTPATIENT_CLINIC_OR_DEPARTMENT_OTHER): Payer: BC Managed Care – PPO

## 2015-06-29 VITALS — BP 113/77 | HR 128 | Temp 97.5°F | Resp 22 | Wt 167.0 lb

## 2015-06-29 VITALS — BP 113/77 | HR 128 | Temp 97.5°F | Resp 22

## 2015-06-29 DIAGNOSIS — Z91041 Radiographic dye allergy status: Secondary | ICD-10-CM | POA: Diagnosis not present

## 2015-06-29 DIAGNOSIS — M549 Dorsalgia, unspecified: Secondary | ICD-10-CM

## 2015-06-29 DIAGNOSIS — C3412 Malignant neoplasm of upper lobe, left bronchus or lung: Secondary | ICD-10-CM

## 2015-06-29 DIAGNOSIS — C7951 Secondary malignant neoplasm of bone: Secondary | ICD-10-CM

## 2015-06-29 DIAGNOSIS — C3490 Malignant neoplasm of unspecified part of unspecified bronchus or lung: Secondary | ICD-10-CM | POA: Insufficient documentation

## 2015-06-29 DIAGNOSIS — C779 Secondary and unspecified malignant neoplasm of lymph node, unspecified: Secondary | ICD-10-CM

## 2015-06-29 DIAGNOSIS — C3410 Malignant neoplasm of upper lobe, unspecified bronchus or lung: Secondary | ICD-10-CM

## 2015-06-29 DIAGNOSIS — Z510 Encounter for antineoplastic radiation therapy: Secondary | ICD-10-CM | POA: Diagnosis not present

## 2015-06-29 DIAGNOSIS — C7931 Secondary malignant neoplasm of brain: Secondary | ICD-10-CM | POA: Diagnosis not present

## 2015-06-29 DIAGNOSIS — R222 Localized swelling, mass and lump, trunk: Secondary | ICD-10-CM | POA: Diagnosis not present

## 2015-06-29 DIAGNOSIS — R627 Adult failure to thrive: Secondary | ICD-10-CM

## 2015-06-29 LAB — COMPREHENSIVE METABOLIC PANEL
ALK PHOS: 91 U/L (ref 40–150)
ALT: <9 U/L (ref 0–55)
AST: 16 U/L (ref 5–34)
Albumin: 1.4 g/dL — ABNORMAL LOW (ref 3.5–5.0)
Anion Gap: 8 mEq/L (ref 3–11)
BUN: 10.6 mg/dL (ref 7.0–26.0)
CO2: 24 meq/L (ref 22–29)
Calcium: 8.7 mg/dL (ref 8.4–10.4)
Chloride: 104 mEq/L (ref 98–109)
Creatinine: 0.7 mg/dL (ref 0.6–1.1)
EGFR: 88 mL/min/{1.73_m2} — AB (ref >90)
GLUCOSE: 104 mg/dL (ref 70–140)
POTASSIUM: 3.7 meq/L (ref 3.5–5.1)
SODIUM: 136 meq/L (ref 136–145)
Total Bilirubin: 0.49 mg/dL (ref 0.20–1.20)
Total Protein: 7.1 g/dL (ref 6.4–8.3)

## 2015-06-29 LAB — CBC WITH DIFFERENTIAL/PLATELET
BASO%: 0.1 % (ref 0.0–2.0)
BASOS ABS: 0 10*3/uL (ref 0.0–0.1)
EOS ABS: 0.3 10*3/uL (ref 0.0–0.5)
EOS%: 2.2 % (ref 0.0–7.0)
HCT: 31 % — ABNORMAL LOW (ref 34.8–46.6)
HGB: 9.7 g/dL — ABNORMAL LOW (ref 11.6–15.9)
LYMPH%: 4.7 % — AB (ref 14.0–49.7)
MCH: 31.8 pg (ref 25.1–34.0)
MCHC: 31.3 g/dL — ABNORMAL LOW (ref 31.5–36.0)
MCV: 101.6 fL — AB (ref 79.5–101.0)
MONO#: 1.6 10*3/uL — ABNORMAL HIGH (ref 0.1–0.9)
MONO%: 11.1 % (ref 0.0–14.0)
NEUT#: 11.9 10*3/uL — ABNORMAL HIGH (ref 1.5–6.5)
NEUT%: 81.9 % — ABNORMAL HIGH (ref 38.4–76.8)
Platelets: 275 10*3/uL (ref 145–400)
RBC: 3.05 10*6/uL — AB (ref 3.70–5.45)
RDW: 23 % — ABNORMAL HIGH (ref 11.2–14.5)
WBC: 14.5 10*3/uL — ABNORMAL HIGH (ref 3.9–10.3)
lymph#: 0.7 10*3/uL — ABNORMAL LOW (ref 0.9–3.3)

## 2015-06-29 LAB — UA PROTEIN, DIPSTICK - CHCC: Protein, ur: <30 mg/dL

## 2015-06-29 MED ORDER — MORPHINE SULFATE 15 MG PO TABS: 15.0000 mg | ORAL_TABLET | Freq: Four times a day (QID) | ORAL | Status: AC | PRN

## 2015-06-29 MED ORDER — MORPHINE SULFATE 15 MG PO TABS
15.0000 mg | ORAL_TABLET | Freq: Once | ORAL | Status: AC
  Administered 2015-06-29: 15 mg via ORAL

## 2015-06-29 MED ORDER — MORPHINE SULFATE 15 MG PO TABS
ORAL_TABLET | ORAL | Status: AC
Start: 2015-06-29 — End: 2015-06-29
  Filled 2015-06-29: qty 1

## 2015-06-29 NOTE — Progress Notes (Signed)
Weight loss noted. Heart rate elevated. Reports back pain just below right scapula 10 on a scale of 0-10. Morphine given in Dr. Gearldine Shown office. Raised palpable 3 cm lesion just below right scapula.

## 2015-06-29 NOTE — Progress Notes (Addendum)
[facility] OFFICE PROGRESS NOTE   Diagnosis:  Non-small cell lung cancer  INTERVAL HISTORY:   Christina Hale returns as scheduled. She was admitted 06/20/2015 with severe anemia and weakness. 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 Hospice care was discussed. Her husband wanted to consider resuming chemotherapy in the future and declined the hospice referral. She was discharged home 06/24/2015.  Christina Hale continues to do poorly. Appetite is markedly diminished. She is having trouble swallowing. She is pocketing food. Her family notes increased swelling of the right arm and hand. She has pain in the right arm. She takes tramadol with partial relief. She continues to have an indwelling Foley catheter. She denies nausea. Bowels moving. She is unable to stand or move unassisted. Confusion persists.  Objective:  Vital signs in last 24 hours:  Blood pressure 103/55, pulse 137, temperature 98.7 F (37.1 C), temperature source Oral, resp. rate 18, SpO2 95 %.    HEENT: White coating over tongue. Pupils are small. Extraocular movements intact. Sclera anicteric. Resp: Lungs clear. Cardio: Regular rate and rhythm. GI: Abdomen is soft. No hepatomegaly. Vascular: Pitting edema right lower arm and hand, right lower leg. Chronic stasis change right lower leg. Neuro: She is confused to place and time.  Skin: Cheeks erythematous. Musculoskeletal: Right mid back with tender masslike lesion. Port-A-Cath without erythema.    Lab Results:  Lab Results  Component Value Date   WBC 14.5* 06/29/2015   HGB 9.7* 06/29/2015   HCT 31.0* 06/29/2015   MCV 101.6* 06/29/2015   PLT 275 06/29/2015   NEUTROABS 11.9* 06/29/2015    Imaging:  No results found.  Medications: I have reviewed the patient's current medications.  Assessment/Plan: 1. Stage IV adenocarcinoma of the lung,  dominant left upper lobe mass and spiculated right upper lobe mass  Status post a biopsy of a left hepatic mass confirming metastatic adenocarcinoma consistent with a lung primary, ALK mutation negative, EGFR amplification suboptimal-repeat testing recommend   Staging CT scans 09/27/2013 confirmed a dominant left upper lung mass, a smaller right upper lung mass, left hepatic mass, and left external iliac node.   Cycle 1 Taxol/carboplatin and Avastin per the [facility] E7209 study 10/09/2013.   Cycle 2 Taxol/carboplatin and Avastin 10/29/2013.   Restaging CT evaluation 11/14/2013 with improvement in the lungs and liver. Left external iliac lymph node was stable.   Cycle 3 Taxol/carboplatin and Avastin 12/02/2013   Restaging CT 01/02/2014 with a mixed response-slightly larger left upper lung mass and mediastinal lymph node, slightly smaller liver mass and iliac node   Maintenance Avastin beginning 01/06/2014   Restaging CT 03/06/2014 with a decrease in the size of the lung masses and a liver lesion  Restaging CT 05/22/2014 with a stable right upper lung nodule, decreased size of a left upper lobe lung mass, increased size of a liver lesion, stable left pelvic nodule/node.  Restaging CT 07/24/2014 with no significant change in the chest, increase in size of the mass within the left hepatic lobe, 2 retroperitoneal lymph nodes increased in size, stable appearance of peritoneal nodules.  brain/ skull metastases documented on an MRI of the brain/cervical spine 09/23/2014, taken off of the E5508 study  Restaging CTs 10/09/2014 -slight decrease in size of the left upper lobe cavitary mass , enlargement of the dominant left liver lesion, no significant change in the size of abdominal or chest lymph nodes except  for enlargement of a subcarinal node  Cycle 1 Alimta/Avastin 10/20/2014  Cycle 2 Alimta/Avastin 11/10/2014  Cycle 3 Alimta/Avastin 12/01/2014  Cycle 4 Alimta/Avastin  12/22/2014  Cycle 5 Alimta/Avastin 01/12/2015  Cycle 6 Alimta/Avastin 02/02/2015  MRI brain 01/08/2015 with regression of all 5 brain metastases. 3 still visible. Bone metastases not significantly changed.  Restaging CTs 02/19/2015 with stable left upper lobe cavitary mass, stable right upper lobe lobular nodule, stable left hepatic lobe metastatic lesion. No evidence of disease progression in the abdomen or pelvis.  Cycle 7 Alimta/Avastin 02/23/2015  Cycle 8 Alimta/Avastin 03/16/2015  Cycle 9 Alimta/Avastin 04/06/2015  Cycle 10 Alimta/Avastin 04/27/2015  Restaging CT of the chest abdomen and pelvis performed on 05/07/2015 showed an essentially stable left upper lobe paramediastinal mass. Potential new mild thickening in the mediastinal pleural surface in the anterior mediastinum left. Mild increase in subcarinal lymph node size. Prevascular lymph nodes are not changed. Stable right upper lobe pulmonary nodule. The round lesion in the left lateral hepatic lobe is slightly contraction side. No new hepatic lesions. Perineal nodular metastasis similar compared to CT of 10/09/2014 and 02/19/2015.  Cycle 11 Alimta/Avastin 05/18/2015 2. C. difficile colitis March 2015 3. Hyponatremia/seizure on hospital admission 08/29/2013 4. Chronic right hip "bursitis". 5. Nausea and vomiting following cycle 1 Taxol/carboplatin, the anti-emetic regimen was adjusted with cycle 2. 6. Severe neutropenia following cycle 1 Taxol/carboplatin-Neulasta added with cycle 2. 7. 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 9. Anemia. Likely secondary to chemotherapy, chronic disease, and malnutrition versus bone marrow metastases. She received red cell transfusions 12/20/2013, 12/21/2013, and 06/21/2015 10. Hoarseness-improved, potentially related to toxicity from chemotherapy versus tumor. 11. Right retinal detachment 04/05/2014, status post surgical repair  04/07/2014 12. Low TSH with a borderline elevated T4- she was diagnosed with hyperthyroidism, status post radioactive iodine treatment on 08/21/2014 , started on thyroid hormone replacement by Dr. Forde Dandy 13. History of Proteinuria-mild 14. Decreased right nasolabial fold and right arm/leg weakness 09/22/2014  MRI brain/cervical spine 09/23/2014 revealed 5 brain metastases including a 3.1 cm brainstem metastasis in the left pons, 2 left posterior skull metastases, and a C1 metastasis  Decadron started 09/23/2014 a tapered to off  MRI brain 01/08/2015 with regression of all 5 brain metastases. 3 still visible. Bone metastases not significantly changed.  CT brain 06/09/2015-no acute finding, no evidence of disease progression  MRI brain 06/23/2015-no evidence of disease progression 15. Neck pain, pain with swallowing-likely related to thyroid inflammation from I-131 therapy, improved with Decadron 16. Bilateral leg edema. Bilateral lower extremity venous Doppler negative 02/02/2015. Megace and Norvasc discontinued 02/23/2015. Leg edema markedly improved 03/16/2015. 17. Urine culture 02/23/2015 with Escherichia coli. She completed a course of ciprofloxacin. 18. Facial rash-the differential diagnosis includes acne, rosacea, a paraneoplastic rash, or rash secondary to a collagen vascular disorder 19. Thrombocytopenia secondary to chemotherapy versus bone marrow involvement with non-small cell lung cancer-improved 20. Anorexia/failure to thrive-likely secondary to non-small cell lung cancer versus sequelae of brain radiation   Disposition: Ms. Dyal continues to have a poor performance status. She may be developing radiation dementia or has unrecognized progression of non-small cell lung cancer. Her family understands that in her current condition she is not a candidate for further chemotherapy. Dr. Benay Spice recommends a hospice referral. They are in agreement. End-of-life issues were discussed. She  will be placed on NO CODE BLUE status.  She complained of back pain at today's visit. There may be a mass at the right mid back. Dr. Benay Spice  spoke with Dr. Tammi Klippel. She will be evaluated in radiation oncology today. She was in pain while in the office. We gave her a dose of MSIR 15 mg. She was given a prescription for MSIR 15 mg 1-2 tablets every 6 hours as needed. She or her family will contact the office if the morphine does not relieve her pain.  We scheduled a 3 week follow-up visit.  Patient seen with Dr. Benay Spice. 30 minutes were spent face-to-face at today's visit with the majority of that time involved in counseling/coordination of care.    Ned Card ANP/GNP-BC   06/29/2015  9:56 AM  This was a shared visit with Ned Card. Ms. Jasmer was interviewed and examined. She has continued failure to thrive and remains confused. I suspect the decline in her performance status is related to disease progression and potentially radiation dementia.  She is symptomatic with pain at the right posterior chest wall. There is a palpable mass. I contacted Dr. Tammi Klippel and he will see her today to consider palliative radiation.  Ms. Storbeck and her family agree to a [facility] referral. She will be placed on a No CODE BLUE status. We discontinued Alimta/Avastin chemotherapy.  Julieanne Manson, M.D.

## 2015-06-29 NOTE — Progress Notes (Signed)
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 Benay Spice.  Reports back pain just below right scapula 10 on a scale of 0-10. Morphine given in Dr. Gearldine Shown office. Raised palpable 3 cm lesion just below right scapula.   ALLERGIES:  is allergic to contrast media; codeine; and prednisolone.  Meds: Current Outpatient Prescriptions  Medication Sig Dispense Refill  . ALPRAZolam (XANAX) 0.25 MG tablet Take 1 tablet (0.25 mg total) by mouth 3 (three) times daily as needed for anxiety. 60 tablet 0  . ALPRAZolam (XANAX) 1 MG tablet Take 1/2 to 1 tablet 3 x day aif needed for anxiety or sleep 90 tablet 5  . amLODipine (NORVASC) 5 MG tablet TAKE 1 TABLET BY MOUTH DAILY ** NEEDED OFFICE VISIT 30 tablet 5  . dexamethasone (DECADRON) 4 MG tablet Take 1 tablet (4 mg total) by mouth 4 (four) times daily. 4 times daily for 1 week, 3 times daily for 1 week, 2 times daily for 1 week, 1/2 pill 2 times daily for 2 weeks, then 1/2 pill once daily for two weeks, then stop 90 tablet 0  . Alum & Mag Hydroxide-Simeth (MAGIC MOUTHWASH W/LIDOCAINE) SOLN Take 5 mLs by mouth 4 (four) times daily as needed for mouth pain (swish for 1 minute, then spit). (Patient not taking: Reported on 11/10/2014) 240 mL 0  . Cholecalciferol (VITAMIN D) 2000 UNITS CAPS Take 4,000 Units by mouth daily.    . clotrimazole (MYCELEX) 10 MG troche Take 1 tablet (10 mg total) by mouth 3 (three) times daily. Take while on  Decadron (Patient not taking: Reported on 11/10/2014) 409 tablet 0  . folic acid (FOLVITE) 1 MG tablet TAKE 1 TABLET (1 MG TOTAL) BY MOUTH DAILY. (Patient not taking: Reported on 11/10/2014) 30 tablet 1  . HYDROcodone-acetaminophen (NORCO) 5-325 MG per tablet Take 1 tablet by mouth every 6 (six) hours as needed for moderate pain or severe pain. (Patient not taking: Reported on 11/10/2014) 20 tablet 0  . lidocaine (XYLOCAINE) 2 % solution Use as directed 20 mLs in the mouth or throat as needed for mouth pain. (Patient not taking: Reported on 11/10/2014) 100 mL 0  . Multiple Vitamin (MULTIVITAMIN WITH MINERALS) TABS tablet Take 1 tablet by mouth daily.    . ondansetron (ZOFRAN) 4 MG tablet Take 1 tablet (4 mg total) by mouth daily as needed for nausea or vomiting. (Patient not taking: Reported on 11/10/2014) 30 tablet 1  . oxyCODONE (ROXICODONE) 5 MG/5ML solution Take 5 mLs (5 mg total) by mouth every 4 (four) hours as needed for severe pain. (Patient not taking: Reported on 11/10/2014) 240 mL 0  . senna-docusate (SENNA S) 8.6-50 MG per tablet Take 1 tablet by mouth daily. (Patient not taking: Reported on 11/10/2014)    . sucralfate (CARAFATE) 1 GM/10ML suspension Take 10 mLs (1 g total) by mouth 4 (four) times daily -  with  meals and at bedtime. (Patient not taking: Reported on 10/13/2014) 420 mL 1  . traMADol (ULTRAM) 50 MG tablet Take 1 tablet (50 mg total) by mouth every 6 (six) hours as needed. (Patient not taking: Reported on 10/13/2014) 40 tablet 0   No current facility-administered medications for this encounter.    Physical Findings: The patient is in no acute distress. Patient is alert and oriented.  weight is 167 lb (75.751 kg). Her oral temperature is 97.5 F (36.4 C). Her blood pressure is 113/77 and her pulse is 128. Her respiration is 22 and oxygen saturation is 98%. . Tender 3 cm subcutaneous nodule, mobile inf to rt scap.  No significant changes.  Lab Findings: Lab Results  Component Value Date    WBC 14.5* 06/29/2015   WBC 10.6* 06/23/2015   HGB 9.7* 06/29/2015   HGB 9.0* 06/23/2015   HCT 31.0* 06/29/2015   HCT 28.0* 06/23/2015   PLT 275 06/29/2015   PLT 86* 06/23/2015    Lab Results  Component Value Date   NA 136 06/29/2015   NA 140 06/23/2015   K 3.7 06/29/2015   K 3.9 06/23/2015   CHLORIDE 104 06/29/2015   CO2 24 06/29/2015   CO2 24 06/23/2015   GLUCOSE 104 06/29/2015   GLUCOSE 102* 06/23/2015   BUN 10.6 06/29/2015   BUN 9 06/23/2015   CREATININE 0.7 06/29/2015   CREATININE 0.59 06/23/2015   CREATININE 0.58 10/02/2014   BILITOT 0.49 06/29/2015   BILITOT 0.7 06/22/2015   ALKPHOS 91 06/29/2015   ALKPHOS 68 06/22/2015   AST 16 06/29/2015   AST 17 06/22/2015   ALT <9 06/29/2015   ALT 8* 06/22/2015   PROT 7.1 06/29/2015   PROT 5.7* 06/22/2015   ALBUMIN 1.4* 06/29/2015   ALBUMIN 1.6* 06/22/2015   CALCIUM 8.7 06/29/2015   CALCIUM 8.1* 06/23/2015   ANIONGAP 8 06/29/2015   ANIONGAP 10 06/23/2015    Impression:  The patient has a painful subcutaneous metastasis inferior to the right scapula.  She may benefit from palliative radiotherapy.   Plan:  She has a planning appointment scheduled for later today and single fraction on Friday for palliation.  _____________________________________  Sheral Apley Tammi Klippel, M.D.   This document serves as a record of services personally performed by Tyler Pita, MD. It was created on his behalf by Lendon Collar, a trained medical scribe. The creation of this record is based on the scribe's personal observations and the provider's statements to them. This document has been checked and approved by the attending provider.

## 2015-06-29 NOTE — Telephone Encounter (Signed)
As per Charlie Pitter of [facility] called For [facility] spoke with Amy informed her No code Blue.

## 2015-06-29 NOTE — Telephone Encounter (Signed)
As per [facility] Shark MSIR 15 mg ordered. V/s 113/77,128,97.5, 22 Pt. Tolerated medication well no s/s of medication reaction noted.

## 2015-06-29 NOTE — Progress Notes (Signed)
  Radiation Oncology         [phone]) [phone] ________________________________  Name: Christina Hale MRN: [id number]  Date: 06/29/2015  DOB: 1950/07/04  SIMULATION AND TREATMENT PLANNING NOTE    ICD-9-CM ICD-10-CM   1. Lung cancer, primary, with metastasis from lung to other site, unspecified laterality ([place]) 162.9 C34.90   2. 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  Identity was confirmed.  All relevant records and images related to the planned course of therapy were reviewed.  The patient freely provided informed written consent to proceed with treatment after reviewing the details related to the planned course of therapy. The consent form was witnessed and verified by the simulation staff.  Then, the patient was set-up in a stable reproducible  supine position for radiation therapy.  CT images were obtained.  Surface markings were placed.  The CT images were loaded into the planning software.  Then the target and avoidance structures were contoured.  Treatment planning then occurred.  The radiation prescription was entered and confirmed.  Then, I designed and supervised the construction of a total of one medically necessary complex treatment device in the form of a dynamic MLC covering the tumor .  I have requested : 3D Simulation  I have requested a DVH of the following structures: Target, left lung, right lung, spinal cord.    SPECIAL TREATMENT PROCEDURE:  The planned course of therapy using radiation constitutes a special treatment procedure. Special care is required in the management of this patient for the following reasons. This treatment constitutes a Special Treatment Procedure for the following reason: [ Retreatment in a previously radiated area requiring careful monitoring of  increased risk of toxicity due to overlap of previous treatment..  The special nature of the planned course of radiotherapy will require increased physician supervision and oversight to ensure patient's safety with optimal treatment outcomes.  PLAN:  The patient will receive 8 Gy in 1 fraction.  ________________________________  Sheral Apley Tammi Klippel, M.D.

## 2015-06-29 NOTE — Telephone Encounter (Signed)
per pof to sch pt appt-gave pt copy of avs °

## 2015-06-29 NOTE — Progress Notes (Signed)
See progress note under physician encounter. 

## 2015-06-30 ENCOUNTER — Telehealth: Payer: Self-pay | Admitting: Nutrition

## 2015-06-30 DIAGNOSIS — Z510 Encounter for antineoplastic radiation therapy: Secondary | ICD-10-CM | POA: Diagnosis not present

## 2015-06-30 NOTE — Telephone Encounter (Signed)
I received a message that patient's husband was interested in boost samples for patient. Contacted patient's husband who asked if I would speak to the hospice nurse. Per hospice nurse, patient has really not been eating but will drink liquids. Husband is requesting chocolate boost and will pick this up from the [facility] on Friday, February 27. I will leave this at the front desk for patient.

## 2015-06-30 NOTE — Addendum Note (Signed)
Encounter addended by: Heywood Footman, RN on: 06/30/2015 10:49 AM<BR>     Documentation filed: Charges VN

## 2015-07-01 ENCOUNTER — Encounter: Payer: Self-pay | Admitting: Skilled Nursing Facility1

## 2015-07-01 NOTE — Progress Notes (Signed)
Extension of 06/29/2015 1151 note. Patient's husband explains that Dr. Benay Spice provided the patient with a script for morphine today and made hospice referral. He goes onto explain that his wife's chemo has been cancelled with a plan to follow up with Dr. Benay Spice in three weeks. Husband is concerned that his wife hasn't eaten "hardly anything in 2.5 weeks" due to taste changes. He reports he has to push her to drink three boost per day.

## 2015-07-01 NOTE — Progress Notes (Signed)
Subjective:     Patient ID: Christina Hale, female   DOB: 1951/01/06, 65 y.o.   MRN: [id number]  HPI   Review of Systems     Objective:   Physical Exam To assist the pt in identifying dietary strategies to gain lost wt back.    Assessment:     Pt identified as being malnourished due to lost wt. Pt was contacted via the telephone at [phone]. Pt was resting so her husband spoke with dietitian. Pt is in hospice and does not understand she needs to chew. Pts husband states he offers her liquid energy sources throughout the day and is always reminding her to chew/swallow. Pts husband is being helped by his children and the pts brother. Pts husband is currently trying everything he can to get his wife some nutrients.    Plan:     Dietitian offered verbal support for the pts husband and left the phone number if he has any questions. Dietitian will send caregiver information.

## 2015-07-01 NOTE — Addendum Note (Signed)
Encounter addended by: Heywood Footman, RN on: 07/01/2015 11:25 AM<BR>     Documentation filed: Notes Section

## 2015-07-03 ENCOUNTER — Encounter: Payer: Self-pay | Admitting: Nutrition

## 2015-07-03 ENCOUNTER — Ambulatory Visit: Payer: BC Managed Care – PPO | Admitting: Radiation Oncology

## 2015-07-06 ENCOUNTER — Encounter: Payer: Self-pay | Admitting: Radiation Therapy

## 2015-07-06 ENCOUNTER — Telehealth: Payer: Self-pay | Admitting: *Deleted

## 2015-07-06 NOTE — Progress Notes (Signed)
Amore Ackman was born on 10-Nov-1950 and passed away on July 21, 2015. Courney was a resident of [place], [place] at the time of her passing. She was married to [place]. Remer Macho will be 2:00 PM Monday July 06 2015 at [facility] with burial to follow at [facility]. The family will receive friends from 1-1:45 PM Monday at the church prior to the service. In lieu of flowers memorial contributions may be made to [facility] of [place] [id number] or to the [place] [phone].

## 2015-07-06 NOTE — Telephone Encounter (Signed)
Message from [place], [place] RN notifying Dr. Benay Spice of pt's death on 07-09-2015 @ 9:51PM. MD aware.

## 2015-07-08 NOTE — Progress Notes (Signed)
Mailed caregiver information to patient's husband at his request.

## 2015-07-08 DEATH — deceased

## 2015-07-13 ENCOUNTER — Other Ambulatory Visit: Payer: BC Managed Care – PPO

## 2015-07-18 ENCOUNTER — Other Ambulatory Visit: Payer: Self-pay | Admitting: Oncology

## 2015-07-20 ENCOUNTER — Ambulatory Visit: Payer: BC Managed Care – PPO | Admitting: Oncology

## 2015-07-20 ENCOUNTER — Inpatient Hospital Stay: Admission: RE | Admit: 2015-07-20 | Payer: Self-pay | Source: Ambulatory Visit | Admitting: Radiation Oncology

## 2015-10-05 ENCOUNTER — Encounter: Payer: Self-pay | Admitting: Internal Medicine

## 2015-11-20 ENCOUNTER — Other Ambulatory Visit: Payer: Self-pay | Admitting: Nurse Practitioner

## 2017-01-31 IMAGING — MR MR HEAD WO/W CM
10 of 13 series · 36 of 48 positions shown · IV contrast (15    MULTIHANCE)
Comparison: Head CT 06/09/2015 and MRI 04/16/2015

CLINICAL DATA: Increased confusion and right-sided weakness. Lung
cancer with brain metastases.

EXAM:
MRI HEAD WITHOUT AND WITH CONTRAST
TECHNIQUE: Multiplanar, multiecho pulse sequences of the brain and surrounding
structures were obtained without and with intravenous contrast.
CONTRAST:  15 mL MultiHance

[Series 4: T1 · sagittal · 5.0mm · 0.47mm/px · 1 of 25 slices shown]
[im 1/25]
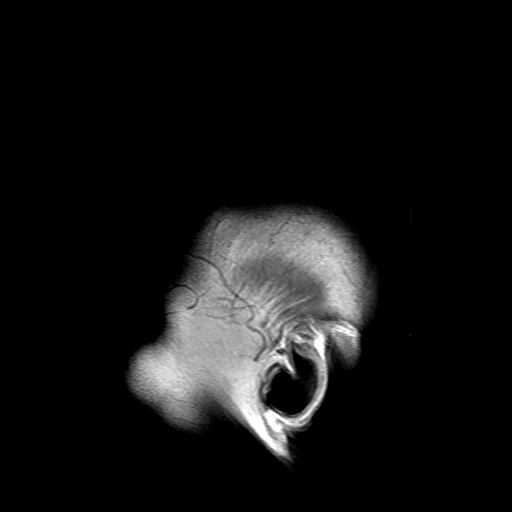

[Series 5: DWI · axial · 3.0mm · 1.09mm/px · z∈[-33,+137]mm · 11 of 116 slices shown (1 of 4)]
[im 1/116]
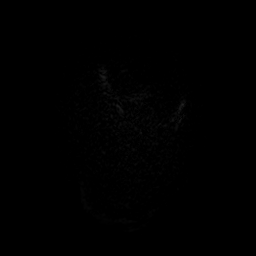
[im 12/116]
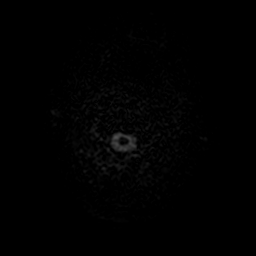
[im 24/116]
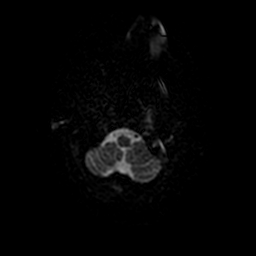
[im 35/116]
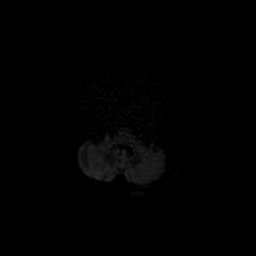
[im 47/116]
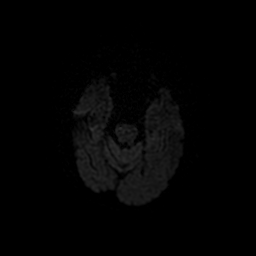
[im 58/116]
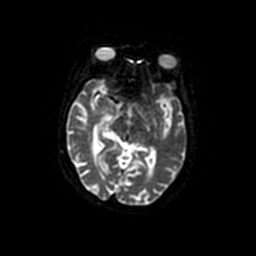
[im 70/116]
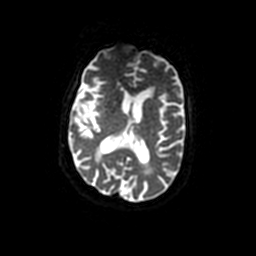
[im 81/116]
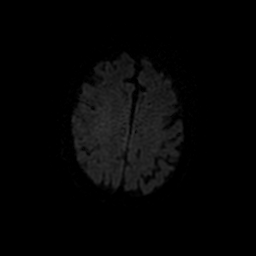
[im 93/116]
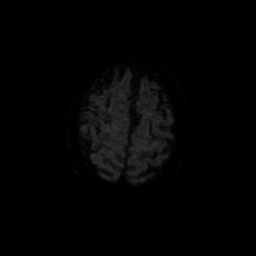
[im 104/116]
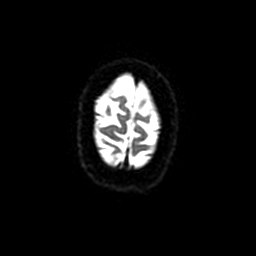
[im 116/116]
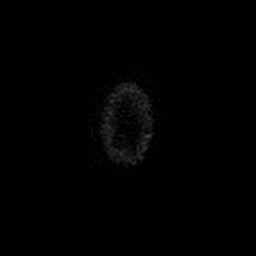

[Series 6: DWI · coronal · 5.0mm · 1.09mm/px · 6 of 76 slices shown (2 of 4)]
[im 1/76]
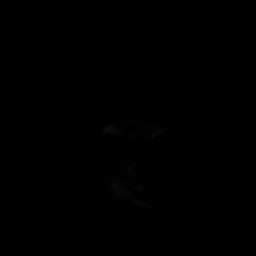
[im 16/76]
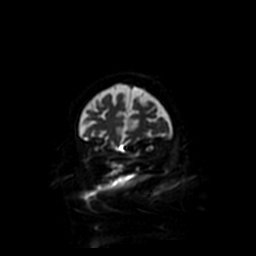
[im 31/76]
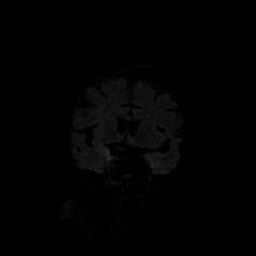
[im 46/76]
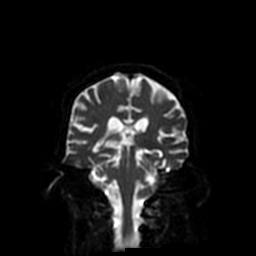
[im 61/76]
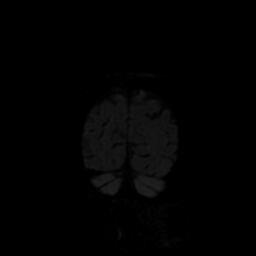
[im 76/76]
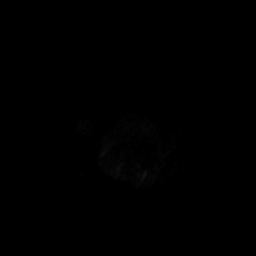

[Series 7: T2 · axial · 5.0mm · 0.43mm/px · z∈[-29,+145]mm · 2 of 28 slices shown]
[im 1/28]
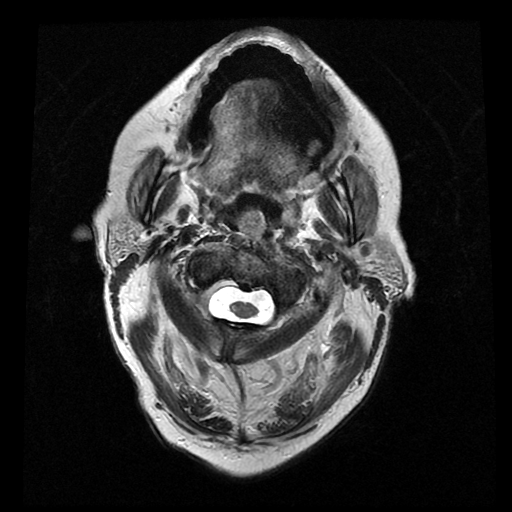
[im 28/28]
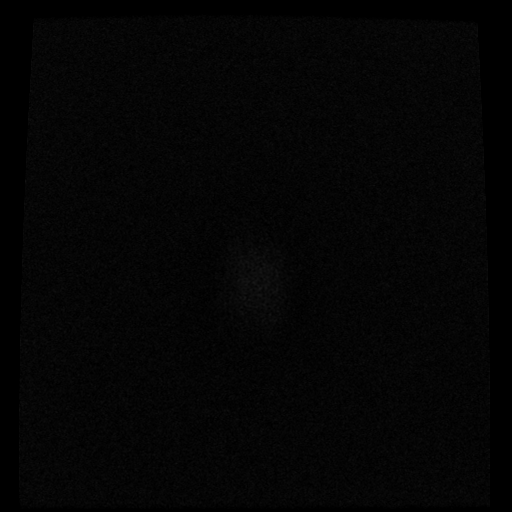

[Series 8: FLAIR · axial · 5.0mm · 0.43mm/px · z∈[-25,+142]mm · 2 of 25 slices shown]
[im 1/25]
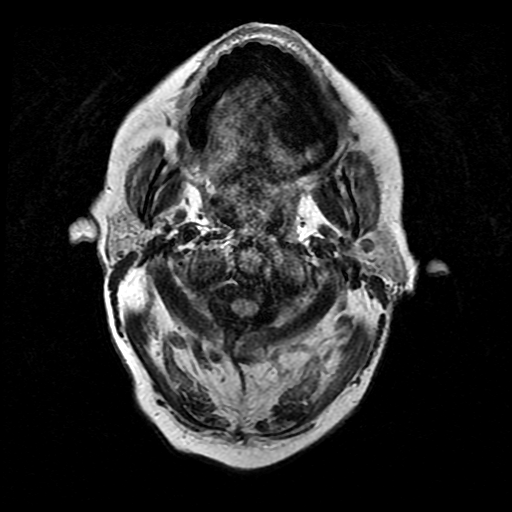
[im 25/25]
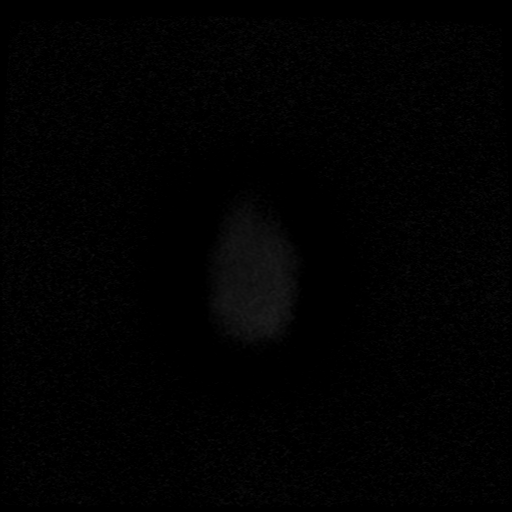

[Series 11: T2 post-contrast · coronal · 5.0mm · 0.45mm/px · 2 of 29 slices shown]
[im 1/29]
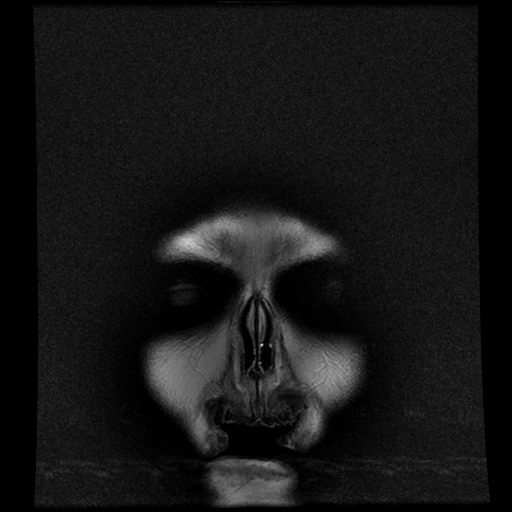
[im 29/29]
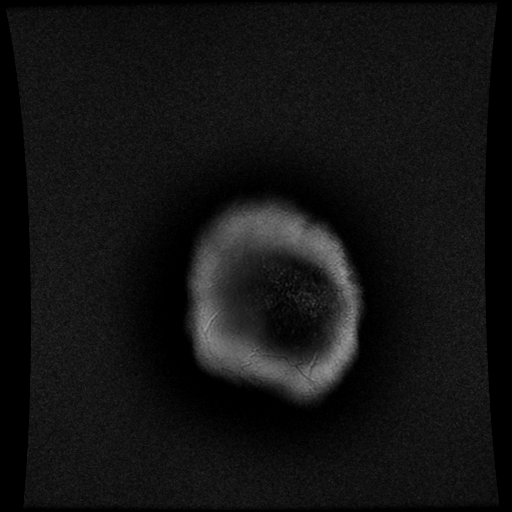

[Series 13: T1 post-contrast · coronal · 5.0mm · 0.45mm/px · 2 of 29 slices shown (1 of 2)]
[im 1/29]
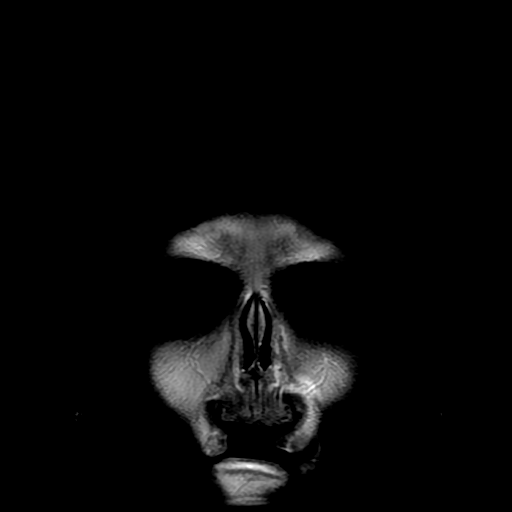
[im 29/29]
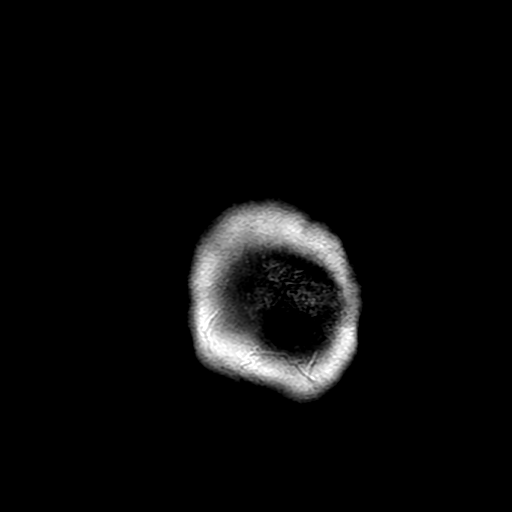

[Series 14: T1 post-contrast · sagittal · 5.0mm · 0.47mm/px · 2 of 25 slices shown (2 of 2)]
[im 1/25]
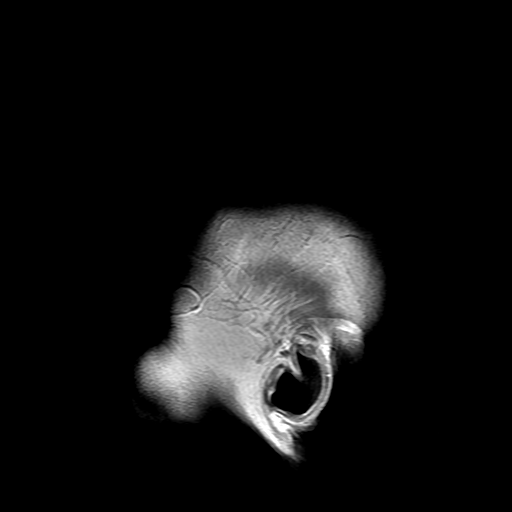
[im 25/25]
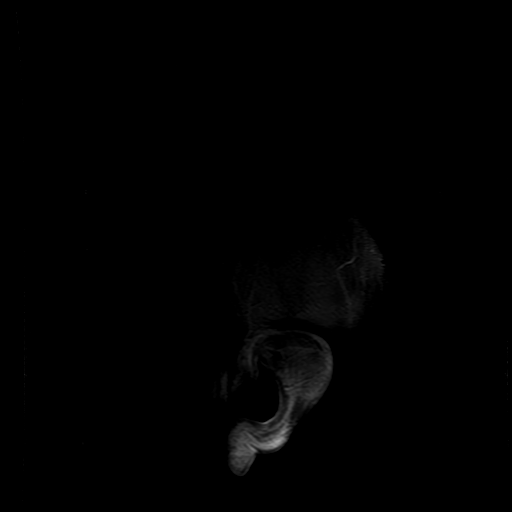

[Series 500: DWI · axial · 3.0mm · 1.09mm/px · z∈[-33,+137]mm · 5 of 58 slices shown (3 of 4)]
[im 1/58]
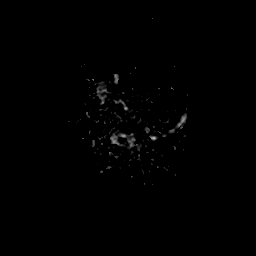
[im 15/58]
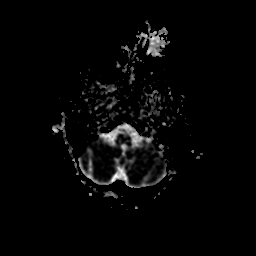
[im 29/58]
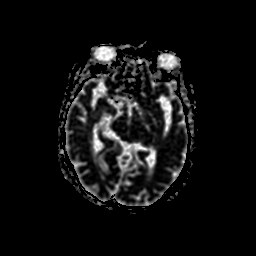
[im 43/58]
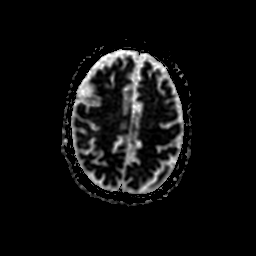
[im 58/58]
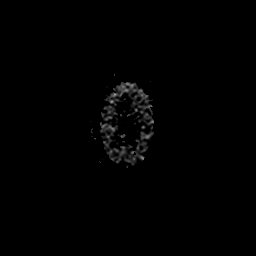

[Series 600: DWI · coronal · 5.0mm · 1.09mm/px · 3 of 38 slices shown (4 of 4)]
[im 1/38]
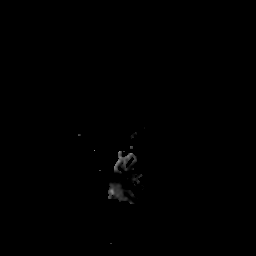
[im 19/38]
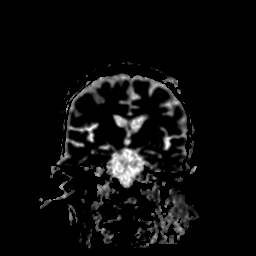
[im 38/38]
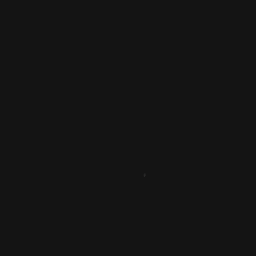

[36 of 48 positions shown; findings below may reference images not displayed]

FINDINGS: There is no evidence of acute infarct, midline shift, or extra-axial
fluid collection. There is mild global cerebral atrophy.
Predominantly periventricular cerebral white matter T2
hyperintensities are unchanged from the prior MRI and nonspecific.

The treated lesion in the left pons again demonstrates chronic blood
products. The lesion measures approximately 7 mm on T2 weighted
images and now only demonstrates faint residual enhancement,
decreased from the prior MRI (series 12, image 16).

Osseous metastasis posterior to the left mastoid air cells has not
significantly changed. 3 mm left occipital lesion (series 12, image
28) is unchanged. The right hippocampal lesion is no longer
identified. No new enhancing brain lesions are seen.

Prior right scleral banding is noted. Mild mucosal thickening in a
mucous retention cyst are present in the left maxillary sinus. There
is also minimal right maxillary and bilateral ethmoid sinus mucosal
thickening. A small left mastoid effusion is similar to the prior
MRI. Major intracranial vascular flow voids are preserved.
IMPRESSION: 1. No acute intracranial abnormality.
2. Unchanged left occipital metastasis.
3. Decreased enhancement of pontine metastasis.
4. Right hippocampal metastasis no longer identified.
5. No evidence of new intracranial metastases.
6. Unchanged osseous metastasis in the left mastoid/occipital
region.
